# Patient Record
Sex: Female | Born: 1935 | Race: White | Hispanic: No | Smoking: Never smoker
Health system: Southern US, Community
[De-identification: ages and names within clinical notes are randomized; demographics above are authoritative.]

## PROBLEM LIST (undated history)

## (undated) DIAGNOSIS — M51369 Other intervertebral disc degeneration, lumbar region without mention of lumbar back pain or lower extremity pain: Secondary | ICD-10-CM

## (undated) DIAGNOSIS — N3281 Overactive bladder: Secondary | ICD-10-CM

## (undated) DIAGNOSIS — I2510 Atherosclerotic heart disease of native coronary artery without angina pectoris: Secondary | ICD-10-CM

## (undated) DIAGNOSIS — M1990 Unspecified osteoarthritis, unspecified site: Secondary | ICD-10-CM

## (undated) DIAGNOSIS — E785 Hyperlipidemia, unspecified: Secondary | ICD-10-CM

## (undated) DIAGNOSIS — A4902 Methicillin resistant Staphylococcus aureus infection, unspecified site: Secondary | ICD-10-CM

## (undated) DIAGNOSIS — I442 Atrioventricular block, complete: Secondary | ICD-10-CM

## (undated) DIAGNOSIS — M5136 Other intervertebral disc degeneration, lumbar region: Secondary | ICD-10-CM

## (undated) DIAGNOSIS — I10 Essential (primary) hypertension: Secondary | ICD-10-CM

## (undated) DIAGNOSIS — N200 Calculus of kidney: Secondary | ICD-10-CM

## (undated) DIAGNOSIS — I509 Heart failure, unspecified: Secondary | ICD-10-CM

## (undated) HISTORY — DX: Essential (primary) hypertension: I10

## (undated) HISTORY — DX: Overactive bladder: N32.81

## (undated) HISTORY — PX: ABDOMINAL HYSTERECTOMY: SHX81

## (undated) HISTORY — PX: CHOLECYSTECTOMY: SHX55

## (undated) HISTORY — PX: BACK SURGERY: SHX140

## (undated) HISTORY — DX: Other intervertebral disc degeneration, lumbar region without mention of lumbar back pain or lower extremity pain: M51.369

## (undated) HISTORY — DX: Calculus of kidney: N20.0

## (undated) HISTORY — DX: Hyperlipidemia, unspecified: E78.5

## (undated) HISTORY — DX: Atherosclerotic heart disease of native coronary artery without angina pectoris: I25.10

## (undated) HISTORY — DX: Other intervertebral disc degeneration, lumbar region: M51.36

## (undated) HISTORY — DX: Unspecified osteoarthritis, unspecified site: M19.90

## (undated) HISTORY — DX: Atrioventricular block, complete: I44.2

## (undated) HISTORY — DX: Methicillin resistant Staphylococcus aureus infection, unspecified site: A49.02

## (undated) HISTORY — PX: APPENDECTOMY: SHX54

## (undated) HISTORY — PX: OTHER SURGICAL HISTORY: SHX169

## (undated) HISTORY — DX: Heart failure, unspecified: I50.9

## (undated) HISTORY — PX: LUMBAR FUSION: SHX111

## (undated) HISTORY — PX: ANKLE SURGERY: SHX546

---

## 1997-04-16 ENCOUNTER — Ambulatory Visit (HOSPITAL_COMMUNITY): Admission: RE | Admit: 1997-04-16 | Discharge: 1997-04-16 | Payer: Self-pay | Admitting: Internal Medicine

## 1997-12-09 ENCOUNTER — Encounter: Payer: Self-pay | Admitting: Orthopedic Surgery

## 1997-12-09 ENCOUNTER — Inpatient Hospital Stay (HOSPITAL_COMMUNITY): Admission: EM | Admit: 1997-12-09 | Discharge: 1997-12-13 | Payer: Self-pay | Admitting: Emergency Medicine

## 1997-12-09 ENCOUNTER — Encounter: Payer: Self-pay | Admitting: Emergency Medicine

## 1998-04-08 ENCOUNTER — Ambulatory Visit (HOSPITAL_COMMUNITY): Admission: RE | Admit: 1998-04-08 | Discharge: 1998-04-08 | Payer: Self-pay | Admitting: Internal Medicine

## 1998-04-13 ENCOUNTER — Encounter: Admission: RE | Admit: 1998-04-13 | Discharge: 1998-07-12 | Payer: Self-pay | Admitting: Orthopedic Surgery

## 1999-05-21 ENCOUNTER — Encounter: Admission: RE | Admit: 1999-05-21 | Discharge: 1999-05-21 | Payer: Self-pay | Admitting: Gastroenterology

## 1999-05-21 ENCOUNTER — Encounter: Payer: Self-pay | Admitting: Gastroenterology

## 2000-08-11 ENCOUNTER — Ambulatory Visit (HOSPITAL_COMMUNITY): Admission: RE | Admit: 2000-08-11 | Discharge: 2000-08-11 | Payer: Self-pay | Admitting: Internal Medicine

## 2002-01-30 ENCOUNTER — Encounter: Payer: Self-pay | Admitting: Internal Medicine

## 2002-01-30 ENCOUNTER — Ambulatory Visit (HOSPITAL_COMMUNITY): Admission: RE | Admit: 2002-01-30 | Discharge: 2002-01-30 | Payer: Self-pay | Admitting: Internal Medicine

## 2002-11-04 ENCOUNTER — Encounter: Admission: RE | Admit: 2002-11-04 | Discharge: 2002-11-04 | Payer: Self-pay | Admitting: Gastroenterology

## 2004-02-11 ENCOUNTER — Emergency Department (HOSPITAL_COMMUNITY): Admission: EM | Admit: 2004-02-11 | Discharge: 2004-02-11 | Payer: Self-pay | Admitting: Emergency Medicine

## 2004-05-12 ENCOUNTER — Inpatient Hospital Stay (HOSPITAL_COMMUNITY): Admission: AD | Admit: 2004-05-12 | Discharge: 2004-05-14 | Payer: Self-pay | Admitting: *Deleted

## 2004-05-13 HISTORY — PX: PACEMAKER INSERTION: SHX728

## 2004-08-31 ENCOUNTER — Inpatient Hospital Stay (HOSPITAL_COMMUNITY): Admission: EM | Admit: 2004-08-31 | Discharge: 2004-09-03 | Payer: Self-pay | Admitting: Cardiovascular Disease

## 2004-09-01 HISTORY — PX: CARDIAC CATHETERIZATION: SHX172

## 2004-11-03 ENCOUNTER — Encounter: Admission: RE | Admit: 2004-11-03 | Discharge: 2004-11-03 | Payer: Self-pay | Admitting: Orthopedic Surgery

## 2005-01-03 DIAGNOSIS — A4902 Methicillin resistant Staphylococcus aureus infection, unspecified site: Secondary | ICD-10-CM

## 2005-01-03 HISTORY — DX: Methicillin resistant Staphylococcus aureus infection, unspecified site: A49.02

## 2005-04-03 HISTORY — PX: OTHER SURGICAL HISTORY: SHX169

## 2005-04-04 ENCOUNTER — Ambulatory Visit: Admission: RE | Admit: 2005-04-04 | Discharge: 2005-04-04 | Payer: Self-pay | Admitting: Orthopedic Surgery

## 2005-04-12 ENCOUNTER — Ambulatory Visit (HOSPITAL_COMMUNITY): Admission: RE | Admit: 2005-04-12 | Discharge: 2005-04-12 | Payer: Self-pay | Admitting: Cardiovascular Disease

## 2005-05-02 ENCOUNTER — Encounter (INDEPENDENT_AMBULATORY_CARE_PROVIDER_SITE_OTHER): Payer: Self-pay | Admitting: *Deleted

## 2005-05-02 ENCOUNTER — Inpatient Hospital Stay (HOSPITAL_COMMUNITY): Admission: RE | Admit: 2005-05-02 | Discharge: 2005-05-04 | Payer: Self-pay | Admitting: *Deleted

## 2005-05-04 ENCOUNTER — Encounter: Payer: Self-pay | Admitting: Internal Medicine

## 2005-06-03 HISTORY — PX: OTHER SURGICAL HISTORY: SHX169

## 2005-06-28 ENCOUNTER — Inpatient Hospital Stay (HOSPITAL_COMMUNITY): Admission: RE | Admit: 2005-06-28 | Discharge: 2005-07-04 | Payer: Self-pay | Admitting: Orthopedic Surgery

## 2005-11-03 ENCOUNTER — Ambulatory Visit: Payer: Self-pay | Admitting: Family Medicine

## 2005-11-04 DIAGNOSIS — H8110 Benign paroxysmal vertigo, unspecified ear: Secondary | ICD-10-CM

## 2005-11-15 ENCOUNTER — Encounter: Payer: Self-pay | Admitting: Family Medicine

## 2005-11-15 ENCOUNTER — Ambulatory Visit: Payer: Self-pay | Admitting: Family Medicine

## 2005-11-15 DIAGNOSIS — I2510 Atherosclerotic heart disease of native coronary artery without angina pectoris: Secondary | ICD-10-CM | POA: Insufficient documentation

## 2005-11-15 DIAGNOSIS — E119 Type 2 diabetes mellitus without complications: Secondary | ICD-10-CM

## 2005-11-17 ENCOUNTER — Encounter (INDEPENDENT_AMBULATORY_CARE_PROVIDER_SITE_OTHER): Payer: Self-pay | Admitting: *Deleted

## 2005-11-17 ENCOUNTER — Encounter: Payer: Self-pay | Admitting: Family Medicine

## 2005-11-17 LAB — CONVERTED CEMR LAB
Albumin: 4.3 g/dL (ref 3.5–5.2)
BUN: 23 mg/dL (ref 6–23)
Calcium: 9.9 mg/dL (ref 8.4–10.5)
Chloride: 104 meq/L (ref 96–112)
Glucose, Bld: 84 mg/dL (ref 70–99)
HDL: 44 mg/dL (ref >39)
Hemoglobin: 11.8 g/dL (ref 11.7–14.8)
Hgb A1c MFr Bld: 5.8 % (ref 4.6–6.1)
LDL Cholesterol: 99 mg/dL (ref 0–99)
Potassium: 4.8 meq/L (ref 3.5–5.3)
RBC: 3.75 M/uL — ABNORMAL LOW (ref 3.79–4.96)
Sodium: 142 meq/L (ref 135–145)
Total Protein: 6.9 g/dL (ref 6.0–8.3)
Triglycerides: 170 mg/dL — ABNORMAL HIGH (ref <150)
WBC: 4.4 10*3/uL (ref 3.7–10.0)

## 2005-11-21 ENCOUNTER — Telehealth (INDEPENDENT_AMBULATORY_CARE_PROVIDER_SITE_OTHER): Payer: Self-pay | Admitting: *Deleted

## 2005-12-13 ENCOUNTER — Ambulatory Visit: Payer: Self-pay | Admitting: Family Medicine

## 2005-12-13 DIAGNOSIS — I10 Essential (primary) hypertension: Secondary | ICD-10-CM

## 2005-12-14 ENCOUNTER — Encounter: Payer: Self-pay | Admitting: Family Medicine

## 2005-12-22 ENCOUNTER — Ambulatory Visit: Payer: Self-pay | Admitting: Family Medicine

## 2005-12-23 ENCOUNTER — Telehealth: Payer: Self-pay | Admitting: Family Medicine

## 2006-01-11 ENCOUNTER — Ambulatory Visit: Payer: Self-pay | Admitting: Family Medicine

## 2006-01-11 DIAGNOSIS — R5383 Other fatigue: Secondary | ICD-10-CM

## 2006-01-11 DIAGNOSIS — R5381 Other malaise: Secondary | ICD-10-CM

## 2006-01-11 DIAGNOSIS — R109 Unspecified abdominal pain: Secondary | ICD-10-CM

## 2006-02-08 ENCOUNTER — Ambulatory Visit: Payer: Self-pay | Admitting: Family Medicine

## 2006-02-08 DIAGNOSIS — F411 Generalized anxiety disorder: Secondary | ICD-10-CM | POA: Insufficient documentation

## 2006-02-15 ENCOUNTER — Encounter: Payer: Self-pay | Admitting: Family Medicine

## 2006-02-20 ENCOUNTER — Encounter: Payer: Self-pay | Admitting: Family Medicine

## 2006-02-22 ENCOUNTER — Encounter: Payer: Self-pay | Admitting: Family Medicine

## 2006-02-28 ENCOUNTER — Telehealth: Payer: Self-pay | Admitting: Family Medicine

## 2006-03-08 ENCOUNTER — Ambulatory Visit: Payer: Self-pay | Admitting: Family Medicine

## 2006-03-08 DIAGNOSIS — N318 Other neuromuscular dysfunction of bladder: Secondary | ICD-10-CM

## 2006-03-13 ENCOUNTER — Encounter: Payer: Self-pay | Admitting: Family Medicine

## 2006-04-14 ENCOUNTER — Telehealth: Payer: Self-pay | Admitting: Family Medicine

## 2006-04-20 ENCOUNTER — Encounter: Payer: Self-pay | Admitting: Family Medicine

## 2006-04-23 ENCOUNTER — Encounter: Payer: Self-pay | Admitting: Family Medicine

## 2006-04-28 ENCOUNTER — Encounter: Payer: Self-pay | Admitting: Family Medicine

## 2006-05-02 ENCOUNTER — Encounter: Payer: Self-pay | Admitting: Family Medicine

## 2006-05-04 ENCOUNTER — Encounter: Payer: Self-pay | Admitting: Family Medicine

## 2006-05-08 ENCOUNTER — Ambulatory Visit: Payer: Self-pay | Admitting: Family Medicine

## 2006-05-08 ENCOUNTER — Telehealth (INDEPENDENT_AMBULATORY_CARE_PROVIDER_SITE_OTHER): Payer: Self-pay | Admitting: *Deleted

## 2006-05-08 DIAGNOSIS — M533 Sacrococcygeal disorders, not elsewhere classified: Secondary | ICD-10-CM

## 2006-05-08 DIAGNOSIS — M25559 Pain in unspecified hip: Secondary | ICD-10-CM

## 2006-05-08 DIAGNOSIS — H6120 Impacted cerumen, unspecified ear: Secondary | ICD-10-CM

## 2006-05-09 ENCOUNTER — Encounter: Payer: Self-pay | Admitting: Family Medicine

## 2006-05-15 ENCOUNTER — Encounter: Payer: Self-pay | Admitting: Family Medicine

## 2006-05-18 ENCOUNTER — Encounter: Payer: Self-pay | Admitting: Family Medicine

## 2006-06-05 ENCOUNTER — Encounter: Payer: Self-pay | Admitting: Family Medicine

## 2006-06-06 ENCOUNTER — Encounter: Payer: Self-pay | Admitting: Family Medicine

## 2006-06-12 ENCOUNTER — Ambulatory Visit: Payer: Self-pay | Admitting: Family Medicine

## 2006-06-12 DIAGNOSIS — M545 Low back pain: Secondary | ICD-10-CM

## 2006-06-13 ENCOUNTER — Encounter: Payer: Self-pay | Admitting: Family Medicine

## 2006-06-13 ENCOUNTER — Telehealth: Payer: Self-pay | Admitting: Family Medicine

## 2006-06-13 DIAGNOSIS — N189 Chronic kidney disease, unspecified: Secondary | ICD-10-CM | POA: Insufficient documentation

## 2006-06-13 LAB — CONVERTED CEMR LAB
AST: 15 units/L (ref 0–37)
Albumin: 4.6 g/dL (ref 3.5–5.2)
Alkaline Phosphatase: 109 units/L (ref 39–117)
BUN: 26 mg/dL — ABNORMAL HIGH (ref 6–23)
Calcium: 9.9 mg/dL (ref 8.4–10.5)
Chloride: 101 meq/L (ref 96–112)
Glucose, Bld: 99 mg/dL (ref 70–99)
Potassium: 4.4 meq/L (ref 3.5–5.3)
Sodium: 140 meq/L (ref 135–145)
Total Protein: 7.5 g/dL (ref 6.0–8.3)

## 2006-07-21 ENCOUNTER — Encounter: Payer: Self-pay | Admitting: Family Medicine

## 2006-07-26 ENCOUNTER — Encounter: Payer: Self-pay | Admitting: Family Medicine

## 2006-07-27 ENCOUNTER — Telehealth: Payer: Self-pay | Admitting: Family Medicine

## 2006-08-14 ENCOUNTER — Ambulatory Visit: Payer: Self-pay | Admitting: Family Medicine

## 2006-08-14 DIAGNOSIS — R220 Localized swelling, mass and lump, head: Secondary | ICD-10-CM

## 2006-08-14 DIAGNOSIS — M5030 Other cervical disc degeneration, unspecified cervical region: Secondary | ICD-10-CM

## 2006-08-14 DIAGNOSIS — R221 Localized swelling, mass and lump, neck: Secondary | ICD-10-CM

## 2006-08-14 DIAGNOSIS — L820 Inflamed seborrheic keratosis: Secondary | ICD-10-CM

## 2006-08-15 ENCOUNTER — Encounter: Payer: Self-pay | Admitting: Family Medicine

## 2006-08-15 LAB — CONVERTED CEMR LAB
BUN: 19 mg/dL (ref 6–23)
CO2: 22 meq/L (ref 19–32)
Calcium: 9.4 mg/dL (ref 8.4–10.5)
Creatinine, Ser: 1.32 mg/dL — ABNORMAL HIGH (ref 0.40–1.20)
Glucose, Bld: 101 mg/dL — ABNORMAL HIGH (ref 70–99)
Sodium: 141 meq/L (ref 135–145)

## 2006-08-16 ENCOUNTER — Telehealth: Payer: Self-pay | Admitting: Family Medicine

## 2006-08-16 ENCOUNTER — Encounter: Payer: Self-pay | Admitting: Family Medicine

## 2006-08-16 ENCOUNTER — Telehealth (INDEPENDENT_AMBULATORY_CARE_PROVIDER_SITE_OTHER): Payer: Self-pay | Admitting: *Deleted

## 2006-08-17 ENCOUNTER — Encounter: Admission: RE | Admit: 2006-08-17 | Discharge: 2006-08-17 | Payer: Self-pay | Admitting: Family Medicine

## 2006-08-21 ENCOUNTER — Telehealth (INDEPENDENT_AMBULATORY_CARE_PROVIDER_SITE_OTHER): Payer: Self-pay | Admitting: *Deleted

## 2006-08-31 ENCOUNTER — Encounter: Payer: Self-pay | Admitting: Family Medicine

## 2006-09-11 ENCOUNTER — Encounter: Payer: Self-pay | Admitting: Family Medicine

## 2006-09-28 ENCOUNTER — Encounter: Payer: Self-pay | Admitting: Family Medicine

## 2006-10-01 ENCOUNTER — Inpatient Hospital Stay (HOSPITAL_COMMUNITY): Admission: EM | Admit: 2006-10-01 | Discharge: 2006-10-03 | Payer: Self-pay | Admitting: Emergency Medicine

## 2006-10-03 ENCOUNTER — Encounter: Payer: Self-pay | Admitting: Family Medicine

## 2006-10-09 ENCOUNTER — Telehealth: Payer: Self-pay | Admitting: Family Medicine

## 2006-10-10 ENCOUNTER — Encounter: Payer: Self-pay | Admitting: Family Medicine

## 2006-10-16 ENCOUNTER — Ambulatory Visit: Payer: Self-pay | Admitting: Family Medicine

## 2006-10-16 DIAGNOSIS — R1013 Epigastric pain: Secondary | ICD-10-CM

## 2006-10-16 DIAGNOSIS — K3189 Other diseases of stomach and duodenum: Secondary | ICD-10-CM

## 2006-10-16 LAB — CONVERTED CEMR LAB: Hgb A1c MFr Bld: 6.3 %

## 2006-10-23 ENCOUNTER — Telehealth: Payer: Self-pay | Admitting: Family Medicine

## 2006-11-07 ENCOUNTER — Ambulatory Visit: Payer: Self-pay | Admitting: Gastroenterology

## 2006-11-07 LAB — CONVERTED CEMR LAB
ALT: 17 units/L (ref 0–35)
Albumin: 3.7 g/dL (ref 3.5–5.2)
Alkaline Phosphatase: 113 units/L (ref 39–117)
BUN: 13 mg/dL (ref 6–23)
Basophils Absolute: 0 10*3/uL (ref 0.0–0.1)
Basophils Relative: 0.7 % (ref 0.0–1.0)
CO2: 31 meq/L (ref 19–32)
Calcium: 9.6 mg/dL (ref 8.4–10.5)
Chloride: 104 meq/L (ref 96–112)
Creatinine, Ser: 0.9 mg/dL (ref 0.4–1.2)
MCHC: 34.6 g/dL (ref 30.0–36.0)
Monocytes Absolute: 0.5 10*3/uL (ref 0.2–0.7)
Monocytes Relative: 9.3 % (ref 3.0–11.0)
Platelets: 234 10*3/uL (ref 150–400)
Potassium: 3.9 meq/L (ref 3.5–5.1)
RBC: 3.98 M/uL (ref 3.87–5.11)
RDW: 12.6 % (ref 11.5–14.6)
Total Bilirubin: 0.5 mg/dL (ref 0.3–1.2)

## 2006-11-09 ENCOUNTER — Ambulatory Visit: Payer: Self-pay | Admitting: Gastroenterology

## 2006-11-10 ENCOUNTER — Encounter: Payer: Self-pay | Admitting: Family Medicine

## 2006-11-13 ENCOUNTER — Encounter: Payer: Self-pay | Admitting: Family Medicine

## 2006-11-17 ENCOUNTER — Encounter: Payer: Self-pay | Admitting: Family Medicine

## 2006-11-20 ENCOUNTER — Ambulatory Visit: Payer: Self-pay | Admitting: Gastroenterology

## 2006-12-06 ENCOUNTER — Telehealth (INDEPENDENT_AMBULATORY_CARE_PROVIDER_SITE_OTHER): Payer: Self-pay | Admitting: *Deleted

## 2006-12-13 ENCOUNTER — Encounter: Payer: Self-pay | Admitting: Family Medicine

## 2006-12-18 ENCOUNTER — Ambulatory Visit: Payer: Self-pay | Admitting: Family Medicine

## 2006-12-18 ENCOUNTER — Encounter: Admission: RE | Admit: 2006-12-18 | Discharge: 2006-12-18 | Payer: Self-pay | Admitting: Family Medicine

## 2006-12-18 DIAGNOSIS — G4700 Insomnia, unspecified: Secondary | ICD-10-CM | POA: Insufficient documentation

## 2006-12-26 ENCOUNTER — Telehealth (INDEPENDENT_AMBULATORY_CARE_PROVIDER_SITE_OTHER): Payer: Self-pay | Admitting: *Deleted

## 2007-01-01 ENCOUNTER — Encounter: Payer: Self-pay | Admitting: Family Medicine

## 2007-01-02 ENCOUNTER — Encounter: Payer: Self-pay | Admitting: Family Medicine

## 2007-01-08 ENCOUNTER — Telehealth: Payer: Self-pay | Admitting: Family Medicine

## 2007-01-16 ENCOUNTER — Encounter: Payer: Self-pay | Admitting: Family Medicine

## 2007-01-17 ENCOUNTER — Encounter: Payer: Self-pay | Admitting: Family Medicine

## 2007-01-18 ENCOUNTER — Ambulatory Visit: Payer: Self-pay | Admitting: Family Medicine

## 2007-01-18 DIAGNOSIS — M6281 Muscle weakness (generalized): Secondary | ICD-10-CM | POA: Insufficient documentation

## 2007-01-18 LAB — CONVERTED CEMR LAB: Hgb A1c MFr Bld: 6 %

## 2007-01-19 ENCOUNTER — Encounter: Payer: Self-pay | Admitting: Family Medicine

## 2007-01-25 ENCOUNTER — Encounter: Payer: Self-pay | Admitting: Family Medicine

## 2007-01-26 ENCOUNTER — Telehealth: Payer: Self-pay | Admitting: Family Medicine

## 2007-01-29 ENCOUNTER — Encounter: Admission: RE | Admit: 2007-01-29 | Discharge: 2007-01-29 | Payer: Self-pay | Admitting: Family Medicine

## 2007-01-29 ENCOUNTER — Ambulatory Visit: Payer: Self-pay | Admitting: Family Medicine

## 2007-01-30 ENCOUNTER — Telehealth (INDEPENDENT_AMBULATORY_CARE_PROVIDER_SITE_OTHER): Payer: Self-pay | Admitting: *Deleted

## 2007-01-30 ENCOUNTER — Encounter: Payer: Self-pay | Admitting: Family Medicine

## 2007-01-31 ENCOUNTER — Encounter: Payer: Self-pay | Admitting: Family Medicine

## 2007-02-02 ENCOUNTER — Encounter: Payer: Self-pay | Admitting: Family Medicine

## 2007-02-04 ENCOUNTER — Encounter: Payer: Self-pay | Admitting: Family Medicine

## 2007-02-06 ENCOUNTER — Encounter: Payer: Self-pay | Admitting: Family Medicine

## 2007-02-07 ENCOUNTER — Telehealth: Payer: Self-pay | Admitting: Family Medicine

## 2007-02-07 ENCOUNTER — Encounter: Payer: Self-pay | Admitting: Family Medicine

## 2007-02-08 ENCOUNTER — Encounter: Payer: Self-pay | Admitting: Family Medicine

## 2007-02-08 ENCOUNTER — Telehealth: Payer: Self-pay | Admitting: Family Medicine

## 2007-02-09 ENCOUNTER — Ambulatory Visit: Payer: Self-pay | Admitting: Family Medicine

## 2007-02-09 DIAGNOSIS — J189 Pneumonia, unspecified organism: Secondary | ICD-10-CM

## 2007-02-12 ENCOUNTER — Encounter: Payer: Self-pay | Admitting: Family Medicine

## 2007-02-13 ENCOUNTER — Telehealth: Payer: Self-pay | Admitting: Family Medicine

## 2007-02-13 ENCOUNTER — Encounter: Payer: Self-pay | Admitting: Family Medicine

## 2007-02-20 ENCOUNTER — Encounter: Admission: RE | Admit: 2007-02-20 | Discharge: 2007-02-20 | Payer: Self-pay | Admitting: Family Medicine

## 2007-02-20 ENCOUNTER — Ambulatory Visit: Payer: Self-pay | Admitting: Family Medicine

## 2007-02-26 DIAGNOSIS — I5032 Chronic diastolic (congestive) heart failure: Secondary | ICD-10-CM

## 2007-02-26 DIAGNOSIS — F329 Major depressive disorder, single episode, unspecified: Secondary | ICD-10-CM

## 2007-02-26 DIAGNOSIS — K589 Irritable bowel syndrome without diarrhea: Secondary | ICD-10-CM | POA: Insufficient documentation

## 2007-02-26 DIAGNOSIS — K449 Diaphragmatic hernia without obstruction or gangrene: Secondary | ICD-10-CM | POA: Insufficient documentation

## 2007-02-26 DIAGNOSIS — M129 Arthropathy, unspecified: Secondary | ICD-10-CM | POA: Insufficient documentation

## 2007-02-28 ENCOUNTER — Telehealth: Payer: Self-pay | Admitting: Family Medicine

## 2007-03-09 ENCOUNTER — Encounter: Payer: Self-pay | Admitting: Family Medicine

## 2007-03-13 ENCOUNTER — Encounter: Payer: Self-pay | Admitting: Family Medicine

## 2007-03-30 ENCOUNTER — Telehealth: Payer: Self-pay | Admitting: Family Medicine

## 2007-03-31 ENCOUNTER — Emergency Department (HOSPITAL_COMMUNITY): Admission: EM | Admit: 2007-03-31 | Discharge: 2007-03-31 | Payer: Self-pay | Admitting: Emergency Medicine

## 2007-04-03 ENCOUNTER — Telehealth: Payer: Self-pay | Admitting: Family Medicine

## 2007-04-04 ENCOUNTER — Encounter: Admission: RE | Admit: 2007-04-04 | Discharge: 2007-04-04 | Payer: Self-pay | Admitting: Sports Medicine

## 2007-04-09 ENCOUNTER — Encounter: Payer: Self-pay | Admitting: Family Medicine

## 2007-04-11 ENCOUNTER — Encounter: Payer: Self-pay | Admitting: Family Medicine

## 2007-04-13 ENCOUNTER — Encounter: Payer: Self-pay | Admitting: Family Medicine

## 2007-04-16 ENCOUNTER — Telehealth: Payer: Self-pay | Admitting: Family Medicine

## 2007-04-17 ENCOUNTER — Encounter: Admission: RE | Admit: 2007-04-17 | Discharge: 2007-04-17 | Payer: Self-pay | Admitting: Sports Medicine

## 2007-04-17 ENCOUNTER — Encounter: Payer: Self-pay | Admitting: Family Medicine

## 2007-04-26 ENCOUNTER — Encounter: Payer: Self-pay | Admitting: Family Medicine

## 2007-04-30 ENCOUNTER — Encounter: Payer: Self-pay | Admitting: Family Medicine

## 2007-05-01 ENCOUNTER — Encounter: Admission: RE | Admit: 2007-05-01 | Discharge: 2007-05-01 | Payer: Self-pay | Admitting: Sports Medicine

## 2007-05-02 ENCOUNTER — Encounter: Payer: Self-pay | Admitting: Family Medicine

## 2007-05-04 ENCOUNTER — Telehealth: Payer: Self-pay | Admitting: Family Medicine

## 2007-05-07 ENCOUNTER — Ambulatory Visit: Payer: Self-pay | Admitting: Family Medicine

## 2007-05-07 DIAGNOSIS — R350 Frequency of micturition: Secondary | ICD-10-CM

## 2007-05-07 DIAGNOSIS — B372 Candidiasis of skin and nail: Secondary | ICD-10-CM | POA: Insufficient documentation

## 2007-05-07 DIAGNOSIS — L501 Idiopathic urticaria: Secondary | ICD-10-CM

## 2007-05-07 LAB — CONVERTED CEMR LAB
Bilirubin Urine: NEGATIVE
Blood in Urine, dipstick: NEGATIVE
Glucose, Urine, Semiquant: NEGATIVE
Ketones, urine, test strip: NEGATIVE
Nitrite: NEGATIVE
WBC Urine, dipstick: NEGATIVE

## 2007-05-08 LAB — CONVERTED CEMR LAB
HCT: 40.4 % (ref 36.0–46.0)
Hemoglobin: 12.9 g/dL (ref 12.0–15.0)
Lymphocytes Relative: 26 % (ref 12–46)
MCHC: 31.9 g/dL (ref 30.0–36.0)
Monocytes Absolute: 0.6 10*3/uL (ref 0.1–1.0)
Monocytes Relative: 8 % (ref 3–12)
Neutro Abs: 5.3 10*3/uL (ref 1.7–7.7)
RBC: 4.21 M/uL (ref 3.87–5.11)

## 2007-05-10 ENCOUNTER — Telehealth: Payer: Self-pay | Admitting: Family Medicine

## 2007-05-17 ENCOUNTER — Encounter: Admission: RE | Admit: 2007-05-17 | Discharge: 2007-05-17 | Payer: Self-pay | Admitting: Sports Medicine

## 2007-05-18 ENCOUNTER — Encounter: Payer: Self-pay | Admitting: Family Medicine

## 2007-05-23 ENCOUNTER — Telehealth: Payer: Self-pay | Admitting: Family Medicine

## 2007-05-23 ENCOUNTER — Encounter: Payer: Self-pay | Admitting: Family Medicine

## 2007-06-01 ENCOUNTER — Encounter: Payer: Self-pay | Admitting: Family Medicine

## 2007-06-08 ENCOUNTER — Emergency Department (HOSPITAL_COMMUNITY): Admission: EM | Admit: 2007-06-08 | Discharge: 2007-06-08 | Payer: Self-pay | Admitting: Emergency Medicine

## 2007-06-18 ENCOUNTER — Ambulatory Visit: Payer: Self-pay | Admitting: Family Medicine

## 2007-06-21 ENCOUNTER — Encounter: Payer: Self-pay | Admitting: Family Medicine

## 2007-07-05 ENCOUNTER — Telehealth: Payer: Self-pay | Admitting: Family Medicine

## 2007-07-05 ENCOUNTER — Encounter: Payer: Self-pay | Admitting: Family Medicine

## 2007-07-06 ENCOUNTER — Encounter: Payer: Self-pay | Admitting: Family Medicine

## 2007-07-10 ENCOUNTER — Telehealth: Payer: Self-pay | Admitting: Family Medicine

## 2007-07-11 ENCOUNTER — Encounter: Payer: Self-pay | Admitting: Family Medicine

## 2007-07-12 ENCOUNTER — Encounter: Payer: Self-pay | Admitting: Family Medicine

## 2007-07-23 ENCOUNTER — Ambulatory Visit: Payer: Self-pay | Admitting: Family Medicine

## 2007-07-23 ENCOUNTER — Encounter: Admission: RE | Admit: 2007-07-23 | Discharge: 2007-07-23 | Payer: Self-pay | Admitting: Family Medicine

## 2007-07-23 DIAGNOSIS — R609 Edema, unspecified: Secondary | ICD-10-CM

## 2007-07-24 LAB — CONVERTED CEMR LAB
Albumin: 4.1 g/dL (ref 3.5–5.2)
BUN: 22 mg/dL (ref 6–23)
Calcium: 9.7 mg/dL (ref 8.4–10.5)
Chloride: 106 meq/L (ref 96–112)
Ferritin: 39 ng/mL (ref 10–291)
Glucose, Bld: 96 mg/dL (ref 70–99)
Iron: 76 ug/dL (ref 42–145)
Potassium: 4.9 meq/L (ref 3.5–5.3)
TSH: 1.593 microintl units/mL (ref 0.350–4.50)

## 2007-07-27 ENCOUNTER — Telehealth: Payer: Self-pay | Admitting: Family Medicine

## 2007-07-30 ENCOUNTER — Encounter: Payer: Self-pay | Admitting: Family Medicine

## 2007-09-06 ENCOUNTER — Encounter: Payer: Self-pay | Admitting: Family Medicine

## 2007-09-20 ENCOUNTER — Telehealth: Payer: Self-pay | Admitting: Family Medicine

## 2007-09-20 ENCOUNTER — Encounter: Payer: Self-pay | Admitting: Family Medicine

## 2007-09-24 ENCOUNTER — Encounter: Payer: Self-pay | Admitting: Family Medicine

## 2007-09-25 ENCOUNTER — Encounter: Payer: Self-pay | Admitting: Family Medicine

## 2007-09-26 ENCOUNTER — Encounter: Payer: Self-pay | Admitting: Family Medicine

## 2007-09-28 ENCOUNTER — Encounter: Payer: Self-pay | Admitting: Family Medicine

## 2007-10-05 ENCOUNTER — Encounter: Payer: Self-pay | Admitting: Family Medicine

## 2007-10-08 ENCOUNTER — Encounter: Payer: Self-pay | Admitting: Family Medicine

## 2007-10-12 ENCOUNTER — Ambulatory Visit: Payer: Self-pay | Admitting: Family Medicine

## 2007-10-26 IMAGING — CR DG CHEST 2V
2 series · 2 of 2 positions shown · non-contrast
Comparison: 05/14/2004.

CLINICAL DATA: Peripheral vascular disease. Pre-arteriogram evaluation.

CHEST - 2 VIEW

[view not recorded (1 of 2)]
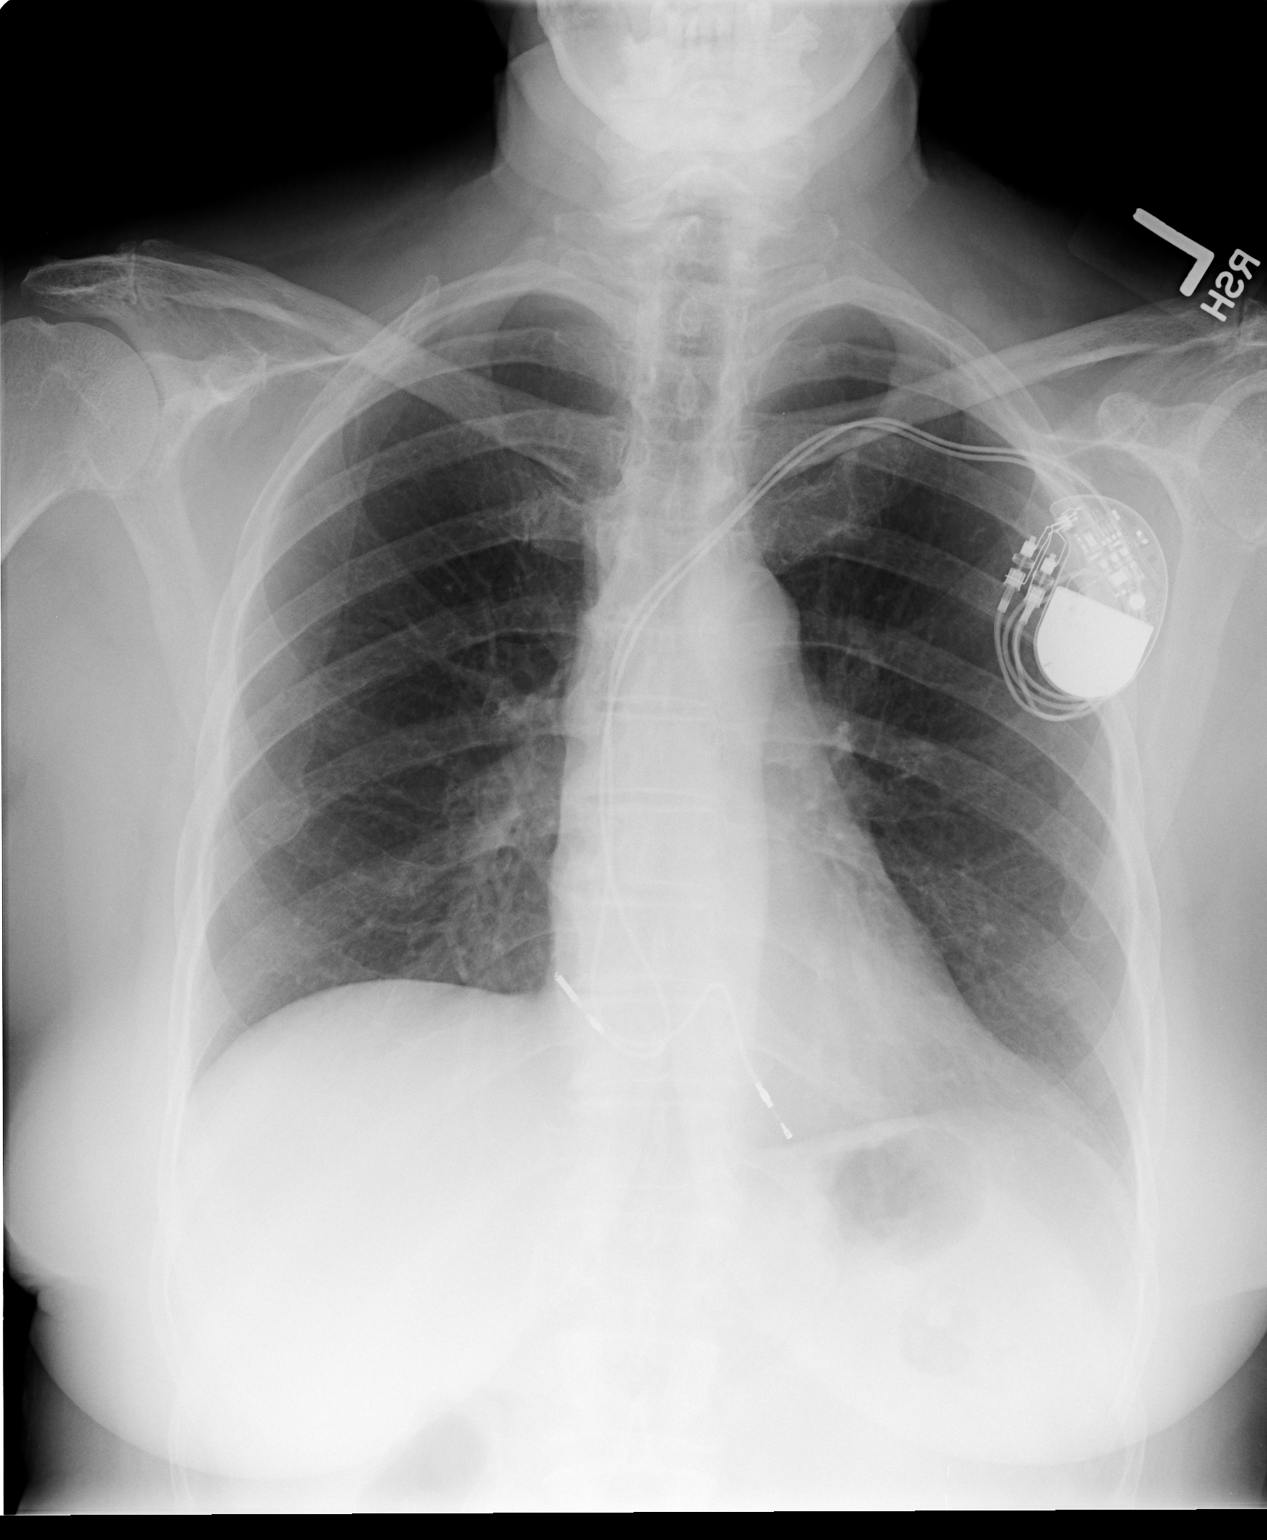

[view not recorded (2 of 2)]
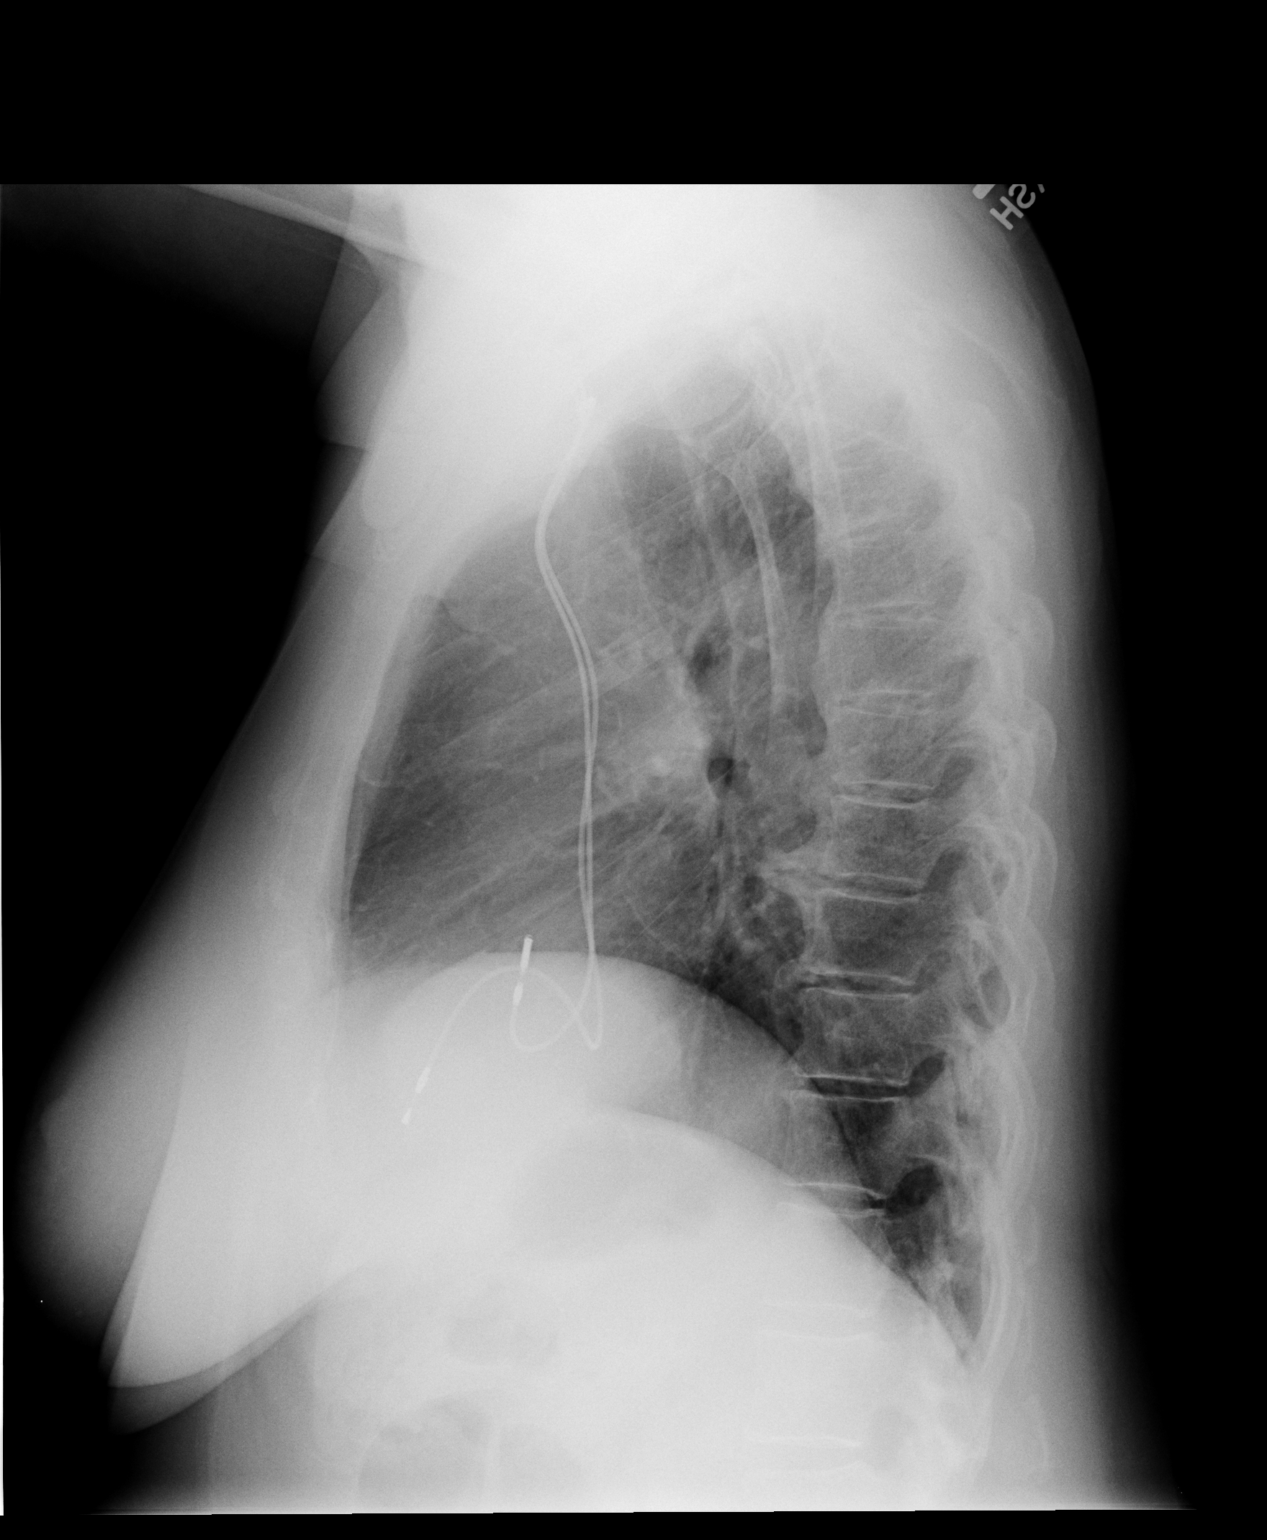

[2 of 2 positions shown; findings below may reference images not displayed]

FINDINGS: Normal sized heart. Stable left subclavian pacemaker leads. Clear
lungs with normal vascularity. Thoracic spine degenerative changes.

IMPRESSION

No acute abnormality.

## 2007-11-19 ENCOUNTER — Encounter: Payer: Self-pay | Admitting: Family Medicine

## 2007-11-30 ENCOUNTER — Encounter: Payer: Self-pay | Admitting: Family Medicine

## 2007-12-03 ENCOUNTER — Encounter: Payer: Self-pay | Admitting: Family Medicine

## 2007-12-06 ENCOUNTER — Encounter: Payer: Self-pay | Admitting: Family Medicine

## 2007-12-20 ENCOUNTER — Encounter: Payer: Self-pay | Admitting: Family Medicine

## 2008-01-01 ENCOUNTER — Telehealth (INDEPENDENT_AMBULATORY_CARE_PROVIDER_SITE_OTHER): Payer: Self-pay | Admitting: *Deleted

## 2008-01-01 ENCOUNTER — Encounter: Payer: Self-pay | Admitting: Family Medicine

## 2008-01-08 ENCOUNTER — Encounter: Payer: Self-pay | Admitting: Family Medicine

## 2008-01-11 ENCOUNTER — Encounter: Payer: Self-pay | Admitting: Family Medicine

## 2008-01-15 ENCOUNTER — Encounter: Admission: RE | Admit: 2008-01-15 | Discharge: 2008-01-15 | Payer: Self-pay | Admitting: Family Medicine

## 2008-01-15 ENCOUNTER — Ambulatory Visit: Payer: Self-pay | Admitting: Family Medicine

## 2008-01-16 ENCOUNTER — Encounter: Payer: Self-pay | Admitting: Family Medicine

## 2008-01-17 ENCOUNTER — Telehealth (INDEPENDENT_AMBULATORY_CARE_PROVIDER_SITE_OTHER): Payer: Self-pay | Admitting: *Deleted

## 2008-01-18 ENCOUNTER — Encounter: Payer: Self-pay | Admitting: Family Medicine

## 2008-01-21 ENCOUNTER — Encounter: Payer: Self-pay | Admitting: Family Medicine

## 2008-01-21 LAB — CONVERTED CEMR LAB
ALT: 12 units/L (ref 0–35)
AST: 17 units/L (ref 0–37)
Alkaline Phosphatase: 89 units/L (ref 39–117)
BUN: 11 mg/dL (ref 6–23)
Creatinine, Ser: 0.98 mg/dL (ref 0.40–1.20)
HCT: 41.3 % (ref 36.0–46.0)
HDL: 54 mg/dL (ref >39)
MCHC: 32.9 g/dL (ref 30.0–36.0)
MCV: 94.5 fL (ref 78.0–100.0)
Platelets: 214 10*3/uL (ref 150–400)
RDW: 13.5 % (ref 11.5–15.5)
Total Bilirubin: 0.5 mg/dL (ref 0.3–1.2)
Total CHOL/HDL Ratio: 2.9
VLDL: 27 mg/dL (ref 0–40)

## 2008-01-23 LAB — CONVERTED CEMR LAB: Hgb A1c MFr Bld: 6 % (ref 4.6–6.1)

## 2008-01-29 ENCOUNTER — Telehealth: Payer: Self-pay | Admitting: Family Medicine

## 2008-01-31 ENCOUNTER — Encounter: Payer: Self-pay | Admitting: Family Medicine

## 2008-02-05 ENCOUNTER — Encounter: Payer: Self-pay | Admitting: Family Medicine

## 2008-02-07 ENCOUNTER — Encounter: Payer: Self-pay | Admitting: Family Medicine

## 2008-02-19 ENCOUNTER — Encounter: Payer: Self-pay | Admitting: Family Medicine

## 2008-02-25 ENCOUNTER — Encounter: Payer: Self-pay | Admitting: Family Medicine

## 2008-02-28 ENCOUNTER — Encounter: Payer: Self-pay | Admitting: Family Medicine

## 2008-03-04 ENCOUNTER — Encounter: Payer: Self-pay | Admitting: Family Medicine

## 2008-03-14 ENCOUNTER — Encounter: Payer: Self-pay | Admitting: Family Medicine

## 2008-03-27 ENCOUNTER — Encounter: Payer: Self-pay | Admitting: Family Medicine

## 2008-04-07 ENCOUNTER — Encounter: Payer: Self-pay | Admitting: Family Medicine

## 2008-04-14 ENCOUNTER — Telehealth: Payer: Self-pay | Admitting: Family Medicine

## 2008-04-15 ENCOUNTER — Encounter: Payer: Self-pay | Admitting: Family Medicine

## 2008-04-23 ENCOUNTER — Ambulatory Visit: Payer: Self-pay | Admitting: Family Medicine

## 2008-04-29 ENCOUNTER — Telehealth: Payer: Self-pay | Admitting: Family Medicine

## 2008-04-30 ENCOUNTER — Encounter: Payer: Self-pay | Admitting: Family Medicine

## 2008-05-01 ENCOUNTER — Encounter: Payer: Self-pay | Admitting: Family Medicine

## 2008-05-06 ENCOUNTER — Telehealth: Payer: Self-pay | Admitting: Family Medicine

## 2008-05-13 ENCOUNTER — Telehealth: Payer: Self-pay | Admitting: Family Medicine

## 2008-05-14 ENCOUNTER — Encounter: Payer: Self-pay | Admitting: Family Medicine

## 2008-05-14 ENCOUNTER — Emergency Department (HOSPITAL_BASED_OUTPATIENT_CLINIC_OR_DEPARTMENT_OTHER): Admission: EM | Admit: 2008-05-14 | Discharge: 2008-05-14 | Payer: Self-pay | Admitting: Emergency Medicine

## 2008-05-16 ENCOUNTER — Telehealth: Payer: Self-pay | Admitting: Family Medicine

## 2008-05-16 ENCOUNTER — Encounter: Payer: Self-pay | Admitting: Family Medicine

## 2008-05-21 ENCOUNTER — Telehealth: Payer: Self-pay | Admitting: Family Medicine

## 2008-05-23 ENCOUNTER — Encounter: Payer: Self-pay | Admitting: Family Medicine

## 2008-05-26 ENCOUNTER — Encounter: Payer: Self-pay | Admitting: Family Medicine

## 2008-05-30 ENCOUNTER — Telehealth: Payer: Self-pay | Admitting: Family Medicine

## 2008-06-04 ENCOUNTER — Telehealth: Payer: Self-pay | Admitting: Family Medicine

## 2008-06-04 ENCOUNTER — Ambulatory Visit: Payer: Self-pay | Admitting: Family Medicine

## 2008-06-04 LAB — CONVERTED CEMR LAB
Blood in Urine, dipstick: NEGATIVE
Nitrite: NEGATIVE
pH: 6

## 2008-06-05 LAB — CONVERTED CEMR LAB
AST: 20 units/L (ref 0–37)
Alkaline Phosphatase: 96 units/L (ref 39–117)
BUN: 27 mg/dL — ABNORMAL HIGH (ref 6–23)
Basophils Absolute: 0 10*3/uL (ref 0.0–0.1)
Basophils Relative: 1 % (ref 0–1)
Creatinine, Ser: 1.25 mg/dL — ABNORMAL HIGH (ref 0.40–1.20)
Eosinophils Relative: 1 % (ref 0–5)
Glucose, Bld: 155 mg/dL — ABNORMAL HIGH (ref 70–99)
HCT: 41 % (ref 36.0–46.0)
Hemoglobin: 13.4 g/dL (ref 12.0–15.0)
Lymphocytes Relative: 33 % (ref 12–46)
MCHC: 32.7 g/dL (ref 30.0–36.0)
Monocytes Absolute: 0.7 10*3/uL (ref 0.1–1.0)
Platelets: 347 10*3/uL (ref 150–400)
Potassium: 5.2 meq/L (ref 3.5–5.3)
Pro B Natriuretic peptide (BNP): 12.4 pg/mL (ref 0.0–100.0)
RDW: 13.8 % (ref 11.5–15.5)
Total Bilirubin: 0.5 mg/dL (ref 0.3–1.2)

## 2008-06-09 ENCOUNTER — Telehealth: Payer: Self-pay | Admitting: Family Medicine

## 2008-06-09 ENCOUNTER — Encounter: Payer: Self-pay | Admitting: Family Medicine

## 2008-06-10 ENCOUNTER — Telehealth: Payer: Self-pay | Admitting: Family Medicine

## 2008-06-11 ENCOUNTER — Encounter: Payer: Self-pay | Admitting: Family Medicine

## 2008-06-11 ENCOUNTER — Telehealth: Payer: Self-pay | Admitting: Family Medicine

## 2008-06-24 ENCOUNTER — Encounter: Payer: Self-pay | Admitting: Family Medicine

## 2008-06-30 ENCOUNTER — Telehealth: Payer: Self-pay | Admitting: Family Medicine

## 2008-06-30 ENCOUNTER — Encounter: Payer: Self-pay | Admitting: Family Medicine

## 2008-07-04 ENCOUNTER — Encounter: Payer: Self-pay | Admitting: Family Medicine

## 2008-07-10 ENCOUNTER — Telehealth: Payer: Self-pay | Admitting: Family Medicine

## 2008-07-14 ENCOUNTER — Telehealth: Payer: Self-pay | Admitting: Family Medicine

## 2008-07-23 ENCOUNTER — Ambulatory Visit: Payer: Self-pay | Admitting: Family Medicine

## 2008-07-23 LAB — CONVERTED CEMR LAB
Bilirubin Urine: NEGATIVE
Blood in Urine, dipstick: NEGATIVE
Glucose, Urine, Semiquant: NEGATIVE
Ketones, urine, test strip: NEGATIVE
Protein, U semiquant: NEGATIVE
Specific Gravity, Urine: 1.015
WBC Urine, dipstick: NEGATIVE
pH: 6.5

## 2008-08-01 ENCOUNTER — Encounter: Payer: Self-pay | Admitting: Family Medicine

## 2008-08-01 ENCOUNTER — Encounter: Admission: RE | Admit: 2008-08-01 | Discharge: 2008-08-01 | Payer: Self-pay | Admitting: Cardiology

## 2008-08-04 ENCOUNTER — Encounter: Payer: Self-pay | Admitting: Family Medicine

## 2008-08-06 HISTORY — PX: US ECHOCARDIOGRAPHY: HXRAD669

## 2008-08-07 ENCOUNTER — Encounter: Payer: Self-pay | Admitting: Family Medicine

## 2008-08-13 ENCOUNTER — Telehealth (INDEPENDENT_AMBULATORY_CARE_PROVIDER_SITE_OTHER): Payer: Self-pay | Admitting: *Deleted

## 2008-08-18 ENCOUNTER — Encounter: Payer: Self-pay | Admitting: Family Medicine

## 2008-08-18 HISTORY — PX: NM MYOCAR PERF WALL MOTION: HXRAD629

## 2008-09-11 ENCOUNTER — Ambulatory Visit: Payer: Self-pay | Admitting: Family Medicine

## 2008-09-11 DIAGNOSIS — J069 Acute upper respiratory infection, unspecified: Secondary | ICD-10-CM | POA: Insufficient documentation

## 2008-09-11 LAB — CONVERTED CEMR LAB: Hgb A1c MFr Bld: 7.3 %

## 2008-09-12 LAB — CONVERTED CEMR LAB
Basophils Relative: 1 % (ref 0–1)
CO2: 22 meq/L (ref 19–32)
Calcium: 9.2 mg/dL (ref 8.4–10.5)
Hemoglobin: 13 g/dL (ref 12.0–15.0)
Lymphocytes Relative: 33 % (ref 12–46)
MCHC: 32.7 g/dL (ref 30.0–36.0)
Monocytes Absolute: 0.5 10*3/uL (ref 0.1–1.0)
Monocytes Relative: 9 % (ref 3–12)
Neutro Abs: 3.2 10*3/uL (ref 1.7–7.7)
Neutrophils Relative %: 55 % (ref 43–77)
Potassium: 4.6 meq/L (ref 3.5–5.3)
RBC: 4.3 M/uL (ref 3.87–5.11)
Sodium: 138 meq/L (ref 135–145)
WBC: 5.8 10*3/uL (ref 4.0–10.5)

## 2008-09-25 ENCOUNTER — Encounter: Payer: Self-pay | Admitting: Family Medicine

## 2008-09-26 ENCOUNTER — Encounter: Payer: Self-pay | Admitting: Family Medicine

## 2008-10-22 ENCOUNTER — Ambulatory Visit: Payer: Self-pay | Admitting: Family Medicine

## 2008-11-06 ENCOUNTER — Encounter: Payer: Self-pay | Admitting: Family Medicine

## 2008-11-18 ENCOUNTER — Telehealth: Payer: Self-pay | Admitting: Family Medicine

## 2008-11-19 ENCOUNTER — Encounter: Payer: Self-pay | Admitting: Family Medicine

## 2008-12-05 ENCOUNTER — Telehealth: Payer: Self-pay | Admitting: Family Medicine

## 2008-12-09 ENCOUNTER — Ambulatory Visit: Payer: Self-pay | Admitting: Family Medicine

## 2008-12-09 ENCOUNTER — Encounter: Admission: RE | Admit: 2008-12-09 | Discharge: 2008-12-09 | Payer: Self-pay | Admitting: Family Medicine

## 2008-12-09 DIAGNOSIS — R112 Nausea with vomiting, unspecified: Secondary | ICD-10-CM

## 2008-12-09 DIAGNOSIS — R1013 Epigastric pain: Secondary | ICD-10-CM

## 2008-12-10 ENCOUNTER — Ambulatory Visit: Payer: Self-pay | Admitting: Diagnostic Radiology

## 2008-12-10 ENCOUNTER — Emergency Department (HOSPITAL_BASED_OUTPATIENT_CLINIC_OR_DEPARTMENT_OTHER): Admission: EM | Admit: 2008-12-10 | Discharge: 2008-12-10 | Payer: Self-pay | Admitting: Emergency Medicine

## 2008-12-10 LAB — CONVERTED CEMR LAB
Albumin: 4.6 g/dL (ref 3.5–5.2)
Amylase: 18 units/L (ref 0–105)
BUN: 15 mg/dL (ref 6–23)
CO2: 29 meq/L (ref 19–32)
Calcium: 10.5 mg/dL (ref 8.4–10.5)
Chloride: 98 meq/L (ref 96–112)
Eosinophils Absolute: 0.2 10*3/uL (ref 0.0–0.7)
Eosinophils Relative: 3 % (ref 0–5)
Glucose, Bld: 175 mg/dL — ABNORMAL HIGH (ref 70–99)
HCT: 42.3 % (ref 36.0–46.0)
Hemoglobin: 13.8 g/dL (ref 12.0–15.0)
Lipase: 15 units/L (ref 0–75)
Lymphocytes Relative: 29 % (ref 12–46)
Lymphs Abs: 1.7 10*3/uL (ref 0.7–4.0)
MCV: 93.8 fL (ref 78.0–100.0)
Monocytes Absolute: 0.6 10*3/uL (ref 0.1–1.0)
Potassium: 4.6 meq/L (ref 3.5–5.3)
RDW: 13.5 % (ref 11.5–15.5)
WBC: 5.7 10*3/uL (ref 4.0–10.5)

## 2008-12-16 ENCOUNTER — Encounter: Admission: RE | Admit: 2008-12-16 | Discharge: 2008-12-16 | Payer: Self-pay | Admitting: Family Medicine

## 2008-12-16 ENCOUNTER — Encounter: Payer: Self-pay | Admitting: Family Medicine

## 2008-12-17 ENCOUNTER — Encounter: Payer: Self-pay | Admitting: Family Medicine

## 2008-12-17 ENCOUNTER — Telehealth: Payer: Self-pay | Admitting: Family Medicine

## 2008-12-17 DIAGNOSIS — K5909 Other constipation: Secondary | ICD-10-CM

## 2008-12-17 DIAGNOSIS — K219 Gastro-esophageal reflux disease without esophagitis: Secondary | ICD-10-CM

## 2008-12-19 ENCOUNTER — Encounter (INDEPENDENT_AMBULATORY_CARE_PROVIDER_SITE_OTHER): Payer: Self-pay | Admitting: *Deleted

## 2008-12-30 ENCOUNTER — Encounter: Payer: Self-pay | Admitting: Family Medicine

## 2008-12-31 ENCOUNTER — Encounter: Payer: Self-pay | Admitting: Family Medicine

## 2009-01-07 ENCOUNTER — Ambulatory Visit: Payer: Self-pay | Admitting: Family Medicine

## 2009-01-07 LAB — CONVERTED CEMR LAB: Hgb A1c MFr Bld: 7.5 %

## 2009-01-13 ENCOUNTER — Encounter: Payer: Self-pay | Admitting: Family Medicine

## 2009-01-20 ENCOUNTER — Telehealth: Payer: Self-pay | Admitting: Family Medicine

## 2009-01-28 ENCOUNTER — Encounter: Payer: Self-pay | Admitting: Family Medicine

## 2009-02-04 ENCOUNTER — Encounter: Payer: Self-pay | Admitting: Family Medicine

## 2009-02-05 ENCOUNTER — Encounter: Payer: Self-pay | Admitting: Family Medicine

## 2009-02-10 ENCOUNTER — Telehealth: Payer: Self-pay | Admitting: Family Medicine

## 2009-02-10 DIAGNOSIS — R300 Dysuria: Secondary | ICD-10-CM | POA: Insufficient documentation

## 2009-02-12 ENCOUNTER — Emergency Department (HOSPITAL_BASED_OUTPATIENT_CLINIC_OR_DEPARTMENT_OTHER): Admission: EM | Admit: 2009-02-12 | Discharge: 2009-02-12 | Payer: Self-pay | Admitting: Emergency Medicine

## 2009-02-12 ENCOUNTER — Encounter: Payer: Self-pay | Admitting: Family Medicine

## 2009-02-12 ENCOUNTER — Ambulatory Visit: Payer: Self-pay | Admitting: Diagnostic Radiology

## 2009-02-12 ENCOUNTER — Telehealth (INDEPENDENT_AMBULATORY_CARE_PROVIDER_SITE_OTHER): Payer: Self-pay | Admitting: *Deleted

## 2009-02-19 ENCOUNTER — Telehealth: Payer: Self-pay | Admitting: Family Medicine

## 2009-02-24 ENCOUNTER — Encounter: Payer: Self-pay | Admitting: Family Medicine

## 2009-02-26 ENCOUNTER — Ambulatory Visit: Payer: Self-pay | Admitting: Family Medicine

## 2009-03-26 ENCOUNTER — Encounter: Payer: Self-pay | Admitting: Family Medicine

## 2009-03-31 ENCOUNTER — Telehealth: Payer: Self-pay | Admitting: Family Medicine

## 2009-04-01 ENCOUNTER — Encounter: Payer: Self-pay | Admitting: Family Medicine

## 2009-04-02 ENCOUNTER — Telehealth: Payer: Self-pay | Admitting: Family Medicine

## 2009-04-07 ENCOUNTER — Ambulatory Visit: Payer: Self-pay | Admitting: Family Medicine

## 2009-04-07 LAB — HM DIABETES FOOT EXAM

## 2009-04-08 ENCOUNTER — Encounter: Payer: Self-pay | Admitting: Family Medicine

## 2009-04-13 ENCOUNTER — Encounter: Payer: Self-pay | Admitting: Family Medicine

## 2009-04-13 ENCOUNTER — Telehealth: Payer: Self-pay | Admitting: Family Medicine

## 2009-04-16 ENCOUNTER — Encounter: Payer: Self-pay | Admitting: Family Medicine

## 2009-04-18 ENCOUNTER — Emergency Department (HOSPITAL_BASED_OUTPATIENT_CLINIC_OR_DEPARTMENT_OTHER): Admission: EM | Admit: 2009-04-18 | Discharge: 2009-04-18 | Payer: Self-pay | Admitting: Emergency Medicine

## 2009-04-18 ENCOUNTER — Ambulatory Visit: Payer: Self-pay | Admitting: Diagnostic Radiology

## 2009-04-20 ENCOUNTER — Ambulatory Visit: Payer: Self-pay | Admitting: Family Medicine

## 2009-04-20 ENCOUNTER — Encounter: Admission: RE | Admit: 2009-04-20 | Discharge: 2009-04-20 | Payer: Self-pay | Admitting: Family Medicine

## 2009-04-20 DIAGNOSIS — R05 Cough: Secondary | ICD-10-CM

## 2009-04-21 ENCOUNTER — Telehealth: Payer: Self-pay | Admitting: Family Medicine

## 2009-04-22 ENCOUNTER — Telehealth: Payer: Self-pay | Admitting: Family Medicine

## 2009-04-29 ENCOUNTER — Encounter: Payer: Self-pay | Admitting: Family Medicine

## 2009-04-29 ENCOUNTER — Ambulatory Visit: Payer: Self-pay | Admitting: Family Medicine

## 2009-04-29 ENCOUNTER — Inpatient Hospital Stay (HOSPITAL_COMMUNITY): Admission: AD | Admit: 2009-04-29 | Discharge: 2009-05-01 | Payer: Self-pay | Admitting: Internal Medicine

## 2009-05-12 ENCOUNTER — Encounter: Payer: Self-pay | Admitting: Family Medicine

## 2009-05-20 ENCOUNTER — Ambulatory Visit: Payer: Self-pay | Admitting: Family Medicine

## 2009-05-20 LAB — CONVERTED CEMR LAB
Blood in Urine, dipstick: NEGATIVE
Nitrite: NEGATIVE
Specific Gravity, Urine: 1.015
WBC Urine, dipstick: NEGATIVE

## 2009-05-21 ENCOUNTER — Encounter: Payer: Self-pay | Admitting: Family Medicine

## 2009-06-03 ENCOUNTER — Telehealth (INDEPENDENT_AMBULATORY_CARE_PROVIDER_SITE_OTHER): Payer: Self-pay | Admitting: *Deleted

## 2009-06-23 ENCOUNTER — Telehealth (INDEPENDENT_AMBULATORY_CARE_PROVIDER_SITE_OTHER): Payer: Self-pay | Admitting: *Deleted

## 2009-07-08 ENCOUNTER — Encounter: Payer: Self-pay | Admitting: Family Medicine

## 2009-07-14 ENCOUNTER — Encounter: Payer: Self-pay | Admitting: Family Medicine

## 2009-07-28 ENCOUNTER — Encounter: Payer: Self-pay | Admitting: Family Medicine

## 2009-08-04 ENCOUNTER — Encounter: Payer: Self-pay | Admitting: Family Medicine

## 2009-08-11 ENCOUNTER — Ambulatory Visit: Payer: Self-pay | Admitting: Family Medicine

## 2009-08-11 DIAGNOSIS — L02219 Cutaneous abscess of trunk, unspecified: Secondary | ICD-10-CM

## 2009-08-11 DIAGNOSIS — L03319 Cellulitis of trunk, unspecified: Secondary | ICD-10-CM

## 2009-08-12 ENCOUNTER — Encounter: Payer: Self-pay | Admitting: Family Medicine

## 2009-08-12 LAB — CONVERTED CEMR LAB
AST: 16 units/L (ref 0–37)
BUN: 19 mg/dL (ref 6–23)
Calcium: 9.6 mg/dL (ref 8.4–10.5)
Chloride: 103 meq/L (ref 96–112)
Creatinine, Ser: 1.14 mg/dL (ref 0.40–1.20)
HDL: 51 mg/dL (ref >39)
Total CHOL/HDL Ratio: 4

## 2009-08-13 ENCOUNTER — Encounter: Payer: Self-pay | Admitting: Family Medicine

## 2009-09-02 ENCOUNTER — Telehealth: Payer: Self-pay | Admitting: Family Medicine

## 2009-09-08 ENCOUNTER — Encounter: Payer: Self-pay | Admitting: Family Medicine

## 2009-09-11 ENCOUNTER — Encounter: Payer: Self-pay | Admitting: Family Medicine

## 2009-09-15 ENCOUNTER — Encounter: Payer: Self-pay | Admitting: Family Medicine

## 2009-09-17 ENCOUNTER — Encounter: Payer: Self-pay | Admitting: Family Medicine

## 2009-09-25 ENCOUNTER — Telehealth (INDEPENDENT_AMBULATORY_CARE_PROVIDER_SITE_OTHER): Payer: Self-pay | Admitting: *Deleted

## 2009-10-21 ENCOUNTER — Ambulatory Visit: Payer: Self-pay | Admitting: Radiology

## 2009-10-21 ENCOUNTER — Emergency Department (HOSPITAL_BASED_OUTPATIENT_CLINIC_OR_DEPARTMENT_OTHER): Admission: EM | Admit: 2009-10-21 | Discharge: 2009-10-21 | Payer: Self-pay | Admitting: Emergency Medicine

## 2009-11-22 ENCOUNTER — Ambulatory Visit: Payer: Self-pay | Admitting: Diagnostic Radiology

## 2009-11-22 ENCOUNTER — Emergency Department (HOSPITAL_BASED_OUTPATIENT_CLINIC_OR_DEPARTMENT_OTHER): Admission: EM | Admit: 2009-11-22 | Discharge: 2009-11-22 | Payer: Self-pay | Admitting: Emergency Medicine

## 2009-12-01 ENCOUNTER — Encounter: Payer: Self-pay | Admitting: Family Medicine

## 2009-12-02 ENCOUNTER — Ambulatory Visit: Payer: Self-pay | Admitting: Family Medicine

## 2009-12-02 DIAGNOSIS — S92309A Fracture of unspecified metatarsal bone(s), unspecified foot, initial encounter for closed fracture: Secondary | ICD-10-CM | POA: Insufficient documentation

## 2009-12-02 LAB — CONVERTED CEMR LAB
Glucose, Urine, Semiquant: NEGATIVE
Protein, U semiquant: NEGATIVE
Urobilinogen, UA: 0.2
pH: 5.5

## 2010-01-06 ENCOUNTER — Encounter: Payer: Self-pay | Admitting: Family Medicine

## 2010-01-06 ENCOUNTER — Ambulatory Visit
Admission: RE | Admit: 2010-01-06 | Discharge: 2010-01-06 | Payer: Self-pay | Source: Home / Self Care | Attending: Family Medicine | Admitting: Family Medicine

## 2010-01-06 DIAGNOSIS — R1031 Right lower quadrant pain: Secondary | ICD-10-CM | POA: Insufficient documentation

## 2010-01-06 LAB — CONVERTED CEMR LAB
ALT: 16 units/L (ref 0–35)
AST: 17 units/L (ref 0–37)
Albumin: 3.6 g/dL (ref 3.5–5.2)
Basophils Absolute: 0 10*3/uL (ref 0.0–0.1)
Basophils Relative: 0 % (ref 0–1)
Calcium: 9.7 mg/dL (ref 8.4–10.5)
Chloride: 101 meq/L (ref 96–112)
Creatinine, Ser: 1 mg/dL (ref 0.40–1.20)
MCHC: 33.2 g/dL (ref 30.0–36.0)
Neutro Abs: 4 10*3/uL (ref 1.7–7.7)
Neutrophils Relative %: 53 % (ref 43–77)
Platelets: 211 10*3/uL (ref 150–400)
Potassium: 4.9 meq/L (ref 3.5–5.3)
RDW: 12.6 % (ref 11.5–15.5)
Sodium: 138 meq/L (ref 135–145)

## 2010-01-07 ENCOUNTER — Encounter
Admission: RE | Admit: 2010-01-07 | Discharge: 2010-01-07 | Payer: Self-pay | Source: Home / Self Care | Attending: Family Medicine | Admitting: Family Medicine

## 2010-01-24 ENCOUNTER — Encounter: Payer: Self-pay | Admitting: Neurology

## 2010-02-04 NOTE — Progress Notes (Signed)
Summary: bad cough--need [name] to visit her  Phone Note Call from Patient   Caller: Patient Summary of Call: Dr.Bowen  Patient daughter called and want to know if we can call [name] so they can go out to her and check her because she still has a bad cough Initial call taken by: Vanessa Swazil[place],  June 23, 2009 3:33 PM  Follow-up for Phone Call        Pls call [name] and see if they can come out to check her lungs and do a pulse ox. Follow-up by: Seymour Bars DO,  June 23, 2009 3:40 PM  Additional Follow-up for Phone Call Additional follow up Details #1::        [name] will have Leslie Asp go out to check on Pt.  Additional Follow-up by: Payton Spark CMA,  June 23, 2009 3:47 PM

## 2010-02-04 NOTE — Miscellaneous (Signed)
Summary: [facility]  [facility]   Imported By: Lanelle Bal 05/08/2009 13:09:19  _____________________________________________________________________  External Attachment:    Type:   Image     Comment:   External Document

## 2010-02-04 NOTE — Consult Note (Signed)
Summary: [facility]  [facility]   Imported By: Lanelle Bal 03/04/2009 70:35:00  _____________________________________________________________________  External Attachment:    Type:   Image     Comment:   External Document

## 2010-02-04 NOTE — Progress Notes (Signed)
Summary: f/u urine cx.  Phone Note Other Incoming   Summary of Call: I recieved a fax w/ her urine cx that is + for Klebsiella.  I want to see if she is in the hospital or back at [facility] and check to see if she is currently on abx.   her cx is resistant to ampicillin and macrobid. Initial call taken by: Seymour Bars DO,  February 19, 2009 12:40 PM  Follow-up for Phone Call        [facility] for daughter to [facility] w/ info.  Follow-up by: Payton Spark CMA,  February 19, 2009 1:17 PM  Additional Follow-up for Phone Call Additional follow up Details #1::        Daughter states that is the culture from last week. Pt is at [facility] and has not taken anything besides macrobid. Additional Follow-up by: Payton Spark CMA,  February 19, 2009 4:25 PM    New/Updated Medications: CIPRO 500 MG TABS (CIPROFLOXACIN HCL) 1 tab by mouth q12 hrs x 7 days Prescriptions: CIPRO 500 MG TABS (CIPROFLOXACIN HCL) 1 tab by mouth q12 hrs x 7 days  #14 x 0   Entered and Authorized by:   Seymour Bars DO   Signed by:   Seymour Bars DO on 02/19/2009   Method used:   Print then Give to Patient   RxID:   [id number]   Appended Document: f/u urine cx. Daughter aware and Rx faxed

## 2010-02-04 NOTE — Assessment & Plan Note (Signed)
Summary: PNA   Vital Signs:  Patient profile:   77 year old female Height:      64.5 inches Weight:      191 pounds O2 Sat:      96 % on Room air Temp:     98.2 degrees F oral Pulse rate:   93 / minute BP sitting:   155 / 73  (left arm) Cuff size:   large  Vitals Entered By: Payton Spark CMA (April 20, 2009 2:41 PM)  O2 Flow:  Room air CC: Chest congestion, cough, runny nose and upper back pain x 5 days.    Primary Care Provider:  Seymour Bars D.O.  CC:  Chest congestion, cough, and runny nose and upper back pain x 5 days. Marland Kitchen  History of Present Illness: 58 yr WF presents with cough and chest congestion x7 days. She has fatigue and dizziness. Her cough is productive, green sputum with SOB. She has a sore throat. Her nose is itchy and runny, this am she blew bloody nasal discharge. She has chest discomfort and tenderness in her back at the shoulder blades. She has nausea and diarrhea, no vomiting. She is trying to drink extra fluids. She is not eating good d/t decreased appetite, she has lost 6 lbs since 04/07/2009. She is not sleeping well.  She is taking regular Robitussen for her cough.   She fell out of the bed Saturday night and hit the floor. She was taken to the [facility] in [place]. Her x-rays were negative. She has a bruise to her right hip.   Allergies (verified): 1)  ! Codeine Sulfate (Codeine Sulfate)  Past History:  Past Medical History: Reviewed history from 01/07/2009 and no changes required. MRSA infection x 2 , 2007 CAD CHF OAB DM type II 2004 osteoarthritis high cholestrol HTN nephrolithiasis lumbar DDD CRF with GFR 30- Stage 3 CKD  [facility] cardiology GI Dr Rhetta Mura  Past Surgical History: Reviewed history from 02/26/2007 and no changes required. pacemaker 2006 appendectomy hysterectomy cholecystectomy back surgery x 2 CEA 4-07 L hip replacment 6-07 kidney stone removal  Lumbar fusion cataract surgery ankle surgery  Family  History: Reviewed history from 11/04/2005 and no changes required. mother heard dz, HTN, DM sister breast cancer  Social History: Reviewed history from 12/18/2006 and no changes required. Retired from [employer] Never Smoked 3 grown children Widowed.  Lifes in ALF in [place].  Ambulates w/ walker. Alcohol use-no Regular exercise-no  Review of Systems General:  Complains of chills, fatigue, malaise, sweats, weakness, and weight loss; denies fever. ENT:  Complains of nasal congestion, postnasal drainage, and sore throat. CV:  Complains of shortness of breath with exertion; denies chest pain or discomfort and swelling of feet. Resp:  Complains of cough and shortness of breath. GI:  Complains of nausea and vomiting; denies abdominal pain, constipation, and diarrhea. MS:  Complains of joint pain.  Physical Exam  General:  alert, well-developed, overweight-appearing, and pale.  in wheelchair.  here with daughter Harriett Sine Head:  normocephalic, atraumatic, and no abnormalities observed.   Eyes:  Conjuctiva clear.  Nose:  no external deformity and no mucosal edema.  nasal congestion Mouth:  pharynx pink and moist.  o/p w/o injection.  slightly hoarse Lungs:  Bibasilar crackles and diminished breath sounds in the bases.   dry cough Heart:  normal rate, regular rhythm, and no murmur.   Pulses:  R radial normal and L radial normal.   Extremities:  1+ left pedal edema and 1+ right  pedal edema.   Skin:  turgor normal and no rashes.  diaphoretic Cervical Nodes:  no anterior cervical chain LA Psych:  normally interactive, good eye contact, not anxious appearing, and not depressed appearing.     Impression & Recommendations:  Problem # 1:  PNEUMONIA, ORGANISM UNSPECIFIED (ICD-486) Clinically, she appears to have pneumonia given acute fatigue/ malaise, SOB, anorexia and wt loss, decreased BS at the bases and coiugh with nausea and vomitting.  CXR to confirm and r/o pulmonary edema.   Empirically treat with  Avelox.   Go to ER if symptoms worsen.  Use Robitussin as needed for cough.  Orders: T-DG Chest 2 View [id number])  Her updated medication list for this problem includes:    Avelox 400 Mg Tabs (Moxifloxacin hcl) .Marland Kitchen... 1 tab by mouth once a day x 10 days  Complete Medication List: 1)  Nexium 40 Mg Cpdr (Esomeprazole magnesium) .... Take 1 tablet by mouth two times a day 2)  Isosorbide Mononitrate Cr 30 Mg Xr24h-tab (Isosorbide mononitrate) .... Take 1 tablet by mouth once a day 3)  Calcarb 600/d 600-125 Mg-unit Tabs (Calcium-vitamin d) .Marland Kitchen.. 1 tab by mouth bid 4)  Lisinopril 10 Mg Tabs (Lisinopril) .Marland Kitchen.. 1 tab by mouth daily 5)  Nitroglycerin 0.4 Mg Subl (Nitroglycerin) .... Prn 6)  Tylenol 325 Mg Tabs (Acetaminophen) .... 2 tab q6 h prn 7)  Furosemide 40 Mg Tabs (Furosemide) .Marland Kitchen.. 1 tab by mouth qam x 7 days then back to 20 mg every other day. 8)  Miralax Powd (Polyethylene glycol 3350) .Marland KitchenMarland KitchenMarland Kitchen 17 g by mouth daily as needed 9)  Colace 100 Mg Caps (Docusate sodium) .... Two times a day by mouth 10)  Preparation H 0.25-3-14-71.9 % Oint (Phenyleph-shark liv oil-mo-pet) .... Apply three times a day and after bms as needed 11)  Adult Aspirin Low Strength 81 Mg Tbdp (Aspirin) .... Take 1 tablet by mouth once a day 12)  Meclizine Hcl 12.5 Mg Tabs (Meclizine hcl) .... Take 1 tablet by mouth once a day as needed dizziness 13)  Vicodin 5-500 Mg Tabs (Hydrocodone-acetaminophen) .... Take one by mouth every six hours as needed pain 14)  Januvia 100 Mg Tabs (Sitagliptin phosphate) .Marland Kitchen.. 1 tab by mouth daily 15)  Potassium Chloride Cr 10 Meq Cr-tabs (Potassium chloride) .... Take 1 tablet by mouth once a day 16)  Loratadine 10 Mg Tabs (Loratadine) .Marland Kitchen.. 1 tab by mouth daily 17)  Sanctura Xr 60 Mg Xr24h-cap (Trospium chloride) .Marland Kitchen.. 1 capsule by mouth daily 18)  Zofran 8 Mg Tabs (Ondansetron hcl) .Marland Kitchen.. 1 tab by mouth q 8 hrs as needed nausea 19)  Gas-x 80 Mg Chew (Simethicone) .Marland Kitchen.. 1 tab by mouth q 4 hrs as needed  gas pain 20)  Premarin 0.625 Mg/gm Crea (Estrogens, conjugated) .... Use pervaginal are 3 x a wk 21)  Zolpidem Tartrate 10 Mg Tabs (Zolpidem tartrate) .... Take 1 tab by mouth at bedtime as needed 22)  Bupropion Hcl 200 Mg Xr12h-tab (Bupropion hcl) .... Take 1 tab by mouth two times a day 23)  Alprazolam 0.5 Mg Tbdp (Alprazolam) .Marland Kitchen.. 1 tab by mouth two times a day as needed anxiety 24)  Fleets Enema  .... Use enema x1; repeat in 12 hrs if no results for a max of 2. 25)  Prune Juice 8 Oz + Benefiber Mixed in  .... Drink daily as directed 26)  Dulcolax 10 Mg Supp (Bisacodyl) .Marland Kitchen.. 1 suppository pr daily as needed for constipation 27)  Glimepiride 1 Mg Tabs (Glimepiride) .Marland KitchenMarland KitchenMarland Kitchen  1 tab by mouth daily with lunch 28)  Manual Wheelchair  .... Use as directed weakness, diabetic neuropathy 29)  Pravastatin Sodium 40 Mg Tabs (Pravastatin sodium) .Marland Kitchen.. 1 tab by mouth qhs 30)  Avelox 400 Mg Tabs (Moxifloxacin hcl) .Marland Kitchen.. 1 tab by mouth once a day x 10 days  Patient Instructions: 1)  CXR today. 2)  Will call you w/ results tomorrow. 3)  Go ahead and start Avelox daily for probable pneumonia. 4)  Use Robitussin as needed for cough. Prescriptions: AVELOX 400 MG TABS (MOXIFLOXACIN HCL) 1 tab by mouth once a day x 10 days  #10 tabs x 0   Entered and Authorized by:   Seymour Bars DO   Signed by:   Seymour Bars DO on 04/20/2009   Method used:   Print then Give to Patient   RxID:   [id number]

## 2010-02-04 NOTE — Miscellaneous (Signed)
Summary: Robitussin Order/[facility]  Robitussin Order/[facility]   Imported By: Lanelle Bal 02/10/2009 11:10:25  _____________________________________________________________________  External Attachment:    Type:   Image     Comment:   External Document

## 2010-02-04 NOTE — Miscellaneous (Signed)
Summary: [facility]  Diet Order/[facility]   Imported By: Lanelle Bal 07/20/2009 14:06:46  _____________________________________________________________________  External Attachment:    Type:   Image     Comment:   External Document

## 2010-02-04 NOTE — Consult Note (Signed)
Summary: [facility]  [facility]   Imported By: Lanelle Bal 09/22/2009 13:25:37  _____________________________________________________________________  External Attachment:    Type:   Image     Comment:   External Document

## 2010-02-04 NOTE — Progress Notes (Signed)
Summary: Med changes  Phone Note Call from Patient   Caller: Daughter Summary of Call: Daughter [facility] stating [facility] has requested to have Pt changed from alprazolam to ativan or clonazepam but Pt does not want to change. Daughter requests that the requests sent to you be denied. Initial call taken by: Payton Spark CMA,  September 02, 2009 10:40 AM  Follow-up for Phone Call        ok, haven't seen anything on my desk as of yet. Follow-up by: Seymour Bars DO,  September 02, 2009 11:03 AM

## 2010-02-04 NOTE — Miscellaneous (Signed)
Summary: A[facility] Order/[facility]  A[facility] Order/[facility]   Imported By: Lanelle Bal 08/13/2009 12:34:18  _____________________________________________________________________  External Attachment:    Type:   Image     Comment:   External Document

## 2010-02-04 NOTE — Miscellaneous (Signed)
Summary: changed alprazolam to clonazepam  Clinical Lists Changes  Medications: Changed medication from ALPRAZOLAM 0.5 MG TBDP (ALPRAZOLAM) 1 tab by mouth two times a day as needed anxiety to CLONAZEPAM 0.5 MG TABS (CLONAZEPAM) 1 tab by mouth two times a day as needed anxiety  Appended Document: changed alprazolam to clonazepam Daughter called back stating Pt has tried clonazepam a few times in the past and it does not work well for Pt. Daughter requests that Pt not be changed at this time. Please advise.  Appended Document: changed alprazolam to clonazepam Pls call the pharmacist and find out why they wanted her changed at her ALF.  Seymour Bars, D.O.  Appended Document: changed alprazolam to clonazepam Called [facility] to find out reason for change and was told that [facility] recommended trying clonazepam to help w/ some social anxiety. [facility] needs D/C order for clonazepam and new Rx for alprazolam. Arvilla Market CMA, [facility] July 28, 2009 4:34 PM   Appended Document: changed alprazolam to clonazepam order faxed

## 2010-02-04 NOTE — Assessment & Plan Note (Signed)
Summary: PNA   Vital Signs:  Patient profile:   77 year old female Height:      64.5 inches Weight:      192 pounds O2 Sat:      98 % on Room air Temp:     98.4 degrees F oral Pulse rate:   76 / minute BP sitting:   129 / 79  (left arm) Cuff size:   large  Vitals Entered By: Payton Spark CMA (April 29, 2009 10:58 AM)  O2 Flow:  Room air  Serial Vital Signs/Assessments:  Comments: 11:29 AM Peak Flow 150 RED zone By: Payton Spark CMA   CC: F/U pneumonia. Feeling worse. Daughter giving her Robitussin and Mucinex but it is not helping.    Primary Care Provider:  Seymour Bars D.O.  CC:  F/U pneumonia. Feeling worse. Daughter giving her Robitussin and Mucinex but it is not helping. Marland Kitchen  History of Present Illness: 77 yo WF presents for f/u of probable pneumonia from 9 days.  She had to change AVELOX to doxycycline b/c her insurance wouldn't cover the cost.  She has 2 more days of Doxycycline.  She has a poor appetite and c/o feeling week.  She had another fall in the shower 2 days ago, striking her forehead.  No LOC.  Has a lump there now.  She is coughing a lot and bring up some mucous.  Daughter is giving her Mucinex which does not helping much.  She is tired even though she is sleeping fairly well.  She has SOB.  Her sputum is green.  she has a little runny nose, subjective fevers.  She has chronic nausea.   Denies any diarrhea or vomitting.    CXR from 4-18 showed only low lung volumes and atx but she was early on in the clinical course.    Current Medications (verified): 1)  Nexium 40 Mg Cpdr (Esomeprazole Magnesium) .... Take 1 Tablet By Mouth Two Times A Day 2)  Isosorbide Mononitrate Cr 30 Mg  Xr24h-Tab (Isosorbide Mononitrate) .... Take 1 Tablet By Mouth Once A Day 3)  Calcarb 600/d 600-125 Mg-Unit Tabs (Calcium-Vitamin D) .Marland Kitchen.. 1 Tab By Mouth Bid 4)  Lisinopril 10 Mg Tabs (Lisinopril) .Marland Kitchen.. 1 Tab By Mouth Daily 5)  Nitroglycerin 0.4 Mg Subl (Nitroglycerin) .... Prn 6)   Tylenol 325 Mg Tabs (Acetaminophen) .... 2 Tab Q6 H Prn 7)  Furosemide 40 Mg Tabs (Furosemide) .Marland Kitchen.. 1 Tab By Mouth Qam X 7 Days Then Back To 20 Mg Every Other Day. 8)  Miralax  Powd (Polyethylene Glycol 3350) .Marland KitchenMarland KitchenMarland Kitchen 17 G By Mouth Daily As Needed 9)  Colace 100 Mg Caps (Docusate Sodium) .... Two Times A Day By Mouth 10)  Preparation H 0.25-3-14-71.9 % Oint (Phenyleph-Shark Liv Oil-Mo-Pet) .... Apply Three Times A Day and After Bms As Needed 11)  Adult Aspirin Low Strength 81 Mg  Tbdp (Aspirin) .... Take 1 Tablet By Mouth Once A Day 12)  Meclizine Hcl 12.5 Mg  Tabs (Meclizine Hcl) .... Take 1 Tablet By Mouth Once A Day As Needed Dizziness 13)  Vicodin 5-500 Mg  Tabs (Hydrocodone-Acetaminophen) .... Take One By Mouth Every Six Hours As Needed Pain 14)  Januvia 100 Mg Tabs (Sitagliptin Phosphate) .Marland Kitchen.. 1 Tab By Mouth Daily 15)  Potassium Chloride Cr 10 Meq Cr-Tabs (Potassium Chloride) .... Take 1 Tablet By Mouth Once A Day 16)  Loratadine 10 Mg Tabs (Loratadine) .Marland Kitchen.. 1 Tab By Mouth Daily 17)  Sanctura Xr 60 Mg Xr24h-Cap (Trospium  Chloride) .Marland Kitchen.. 1 Capsule By Mouth Daily 18)  Zofran 8 Mg Tabs (Ondansetron Hcl) .Marland Kitchen.. 1 Tab By Mouth Q 8 Hrs As Needed Nausea 19)  Gas-X 80 Mg Chew (Simethicone) .Marland Kitchen.. 1 Tab By Mouth Q 4 Hrs As Needed Gas Pain 20)  Premarin 0.625 Mg/gm Crea (Estrogens, Conjugated) .... Use Pervaginal Are 3 X A Wk 21)  Zolpidem Tartrate 10 Mg Tabs (Zolpidem Tartrate) .... Take 1 Tab By Mouth At Bedtime As Needed 22)  Bupropion Hcl 200 Mg Xr12h-Tab (Bupropion Hcl) .... Take 1 Tab By Mouth Two Times A Day 23)  Alprazolam 0.5 Mg Tbdp (Alprazolam) .Marland Kitchen.. 1 Tab By Mouth Two Times A Day As Needed Anxiety 24)  Fleets Enema .... Use Enema X1; Repeat in 12 Hrs If No Results For A Max of 2. 25)  Prune Juice 8 Oz + Benefiber Mixed in .... Drink Daily As Directed 26)  Dulcolax 10 Mg Supp (Bisacodyl) .Marland Kitchen.. 1 Suppository Pr Daily As Needed For Constipation 27)  Glimepiride 1 Mg Tabs (Glimepiride) .Marland Kitchen.. 1 Tab By  Mouth Daily With Lunch 28)  Manual Wheelchair .... Use As Directed Weakness, Diabetic Neuropathy 29)  Pravastatin Sodium 40 Mg Tabs (Pravastatin Sodium) .Marland Kitchen.. 1 Tab By Mouth Qhs 30)  Doxycycline Hyclate 100 Mg Caps (Doxycycline Hyclate) .Marland Kitchen.. 1 Tab By Mouth Two Times A Day X 10 Days  Allergies (verified): 1)  ! Codeine Sulfate (Codeine Sulfate)  Past History:  Past Medical History: Reviewed history from 01/07/2009 and no changes required. MRSA infection x 2 , 2007 CAD CHF OAB DM type II 2004 osteoarthritis high cholestrol HTN nephrolithiasis lumbar DDD CRF with GFR 30- Stage 3 CKD  [facility] cardiology GI Dr Rhetta Mura  Past Surgical History: Reviewed history from 02/26/2007 and no changes required. pacemaker 2006 appendectomy hysterectomy cholecystectomy back surgery x 2 CEA 4-07 L hip replacment 6-07 kidney stone removal  Lumbar fusion cataract surgery ankle surgery  Family History: Reviewed history from 11/04/2005 and no changes required. mother heard dz, HTN, DM sister breast cancer  Social History: Reviewed history from 12/18/2006 and no changes required. Retired from [employer] Never Smoked 3 grown children Widowed.  Lifes in ALF in [place].  Ambulates w/ walker. Alcohol use-no Regular exercise-no  Review of Systems      See HPI  Physical Exam  General:  alert, well-developed, overweight-appearing, and pale.  in wheelchair.  here with daughter Harriett Sine Head:  normocephalic and atraumatic.   Eyes:  conjunctiva clear Ears:  no external deformities.   Nose:  clear rhinorrhea Mouth:  pharynx pink and moist.   Neck:  no masses.   Lungs:  fair air movement over the bases without W/R/R.  nonlabored.  slight rhonchi only with cough with poor cough effort Heart:  normal rate, regular rhythm, and no murmur.   Abdomen:  soft and non-tender.   Extremities:  1+ left pedal edema and 1+ right pedal edema.   Skin:  color normal.   Psych:  depressed affect.     Impression &  Recommendations:  Problem # 1:  PNEUMONIA, ORGANISM UNSPECIFIED (ICD-486) On day 8/ 10 of Doxycline + Mucinex.  4-18 CXR was neg for infiltrate but she had low lung volumes and it was early in her presentation.  REcheck today.  PFs today in the RED zone.  DuoNeb tried today for dyspnea/ cough without any relief.  We discussed options to continue outpt treatment given the fact that she has a good pulse ox, fairly good lung exam and is hemodynamically stable  but Ms Reierson 'feels bad' with extreme fatigue, subjective dyspnea and no appetitie for going on 10 days now, not improving with Doxy + Mucinex and did NOT improve after DuoNeb in the office today.  Daughter and pt wish to have direct admit to [facility]  Sending her over today for direct admit.  Will need labs and a CXR.  She is stable and not requiring O2.  I will plan to see her back in 2 wks for f/u.   Her updated medication list for this problem includes:    Doxycycline Hyclate 100 Mg Caps (Doxycycline hyclate) .Marland Kitchen... 1 tab by mouth two times a day x 10 days  Orders: T-Chest x-ray, 2 views (71020) Albuterol Sulfate Sol 1mg  unit dose (E4540) Ipratropium inhalation sol. unit dose (J8119)  Complete Medication List: 1)  Nexium 40 Mg Cpdr (Esomeprazole magnesium) .... Take 1 tablet by mouth two times a day 2)  Isosorbide Mononitrate Cr 30 Mg Xr24h-tab (Isosorbide mononitrate) .... Take 1 tablet by mouth once a day 3)  Calcarb 600/d 600-125 Mg-unit Tabs (Calcium-vitamin d) .Marland Kitchen.. 1 tab by mouth bid 4)  Lisinopril 10 Mg Tabs (Lisinopril) .Marland Kitchen.. 1 tab by mouth daily 5)  Nitroglycerin 0.4 Mg Subl (Nitroglycerin) .... Prn 6)  Tylenol 325 Mg Tabs (Acetaminophen) .... 2 tab q6 h prn 7)  Furosemide 40 Mg Tabs (Furosemide) .Marland Kitchen.. 1 tab by mouth qam x 7 days then back to 20 mg every other day. 8)  Miralax Powd (Polyethylene glycol 3350) .Marland KitchenMarland KitchenMarland Kitchen 17 g by mouth daily as needed 9)  Colace 100 Mg Caps (Docusate sodium) .... Two times a day by mouth 10)  Preparation H  0.25-3-14-71.9 % Oint (Phenyleph-shark liv oil-mo-pet) .... Apply three times a day and after bms as needed 11)  Adult Aspirin Low Strength 81 Mg Tbdp (Aspirin) .... Take 1 tablet by mouth once a day 12)  Meclizine Hcl 12.5 Mg Tabs (Meclizine hcl) .... Take 1 tablet by mouth once a day as needed dizziness 13)  Vicodin 5-500 Mg Tabs (Hydrocodone-acetaminophen) .... Take one by mouth every six hours as needed pain 14)  Januvia 100 Mg Tabs (Sitagliptin phosphate) .Marland Kitchen.. 1 tab by mouth daily 15)  Potassium Chloride Cr 10 Meq Cr-tabs (Potassium chloride) .... Take 1 tablet by mouth once a day 16)  Loratadine 10 Mg Tabs (Loratadine) .Marland Kitchen.. 1 tab by mouth daily 17)  Sanctura Xr 60 Mg Xr24h-cap (Trospium chloride) .Marland Kitchen.. 1 capsule by mouth daily 18)  Zofran 8 Mg Tabs (Ondansetron hcl) .Marland Kitchen.. 1 tab by mouth q 8 hrs as needed nausea 19)  Gas-x 80 Mg Chew (Simethicone) .Marland Kitchen.. 1 tab by mouth q 4 hrs as needed gas pain 20)  Premarin 0.625 Mg/gm Crea (Estrogens, conjugated) .... Use pervaginal are 3 x a wk 21)  Zolpidem Tartrate 10 Mg Tabs (Zolpidem tartrate) .... Take 1 tab by mouth at bedtime as needed 22)  Bupropion Hcl 200 Mg Xr12h-tab (Bupropion hcl) .... Take 1 tab by mouth two times a day 23)  Alprazolam 0.5 Mg Tbdp (Alprazolam) .Marland Kitchen.. 1 tab by mouth two times a day as needed anxiety 24)  Fleets Enema  .... Use enema x1; repeat in 12 hrs if no results for a max of 2. 25)  Prune Juice 8 Oz + Benefiber Mixed in  .... Drink daily as directed 26)  Dulcolax 10 Mg Supp (Bisacodyl) .Marland Kitchen.. 1 suppository pr daily as needed for constipation 27)  Glimepiride 1 Mg Tabs (Glimepiride) .Marland Kitchen.. 1 tab by mouth daily with lunch 28)  Manual Wheelchair  .... Use as directed weakness, diabetic neuropathy 29)  Pravastatin Sodium 40 Mg Tabs (Pravastatin sodium) .Marland Kitchen.. 1 tab by mouth qhs 30)  Doxycycline Hyclate 100 Mg Caps (Doxycycline hyclate) .Marland Kitchen.. 1 tab by mouth two times a day x 10 days   Medication Administration  Medication # 1:     Medication: Albuterol Sulfate Sol 1mg  unit dose    Diagnosis: PNEUMONIA, ORGANISM UNSPECIFIED (ICD-486)    Dose: 2.5mg     Route: inhaled    Exp Date: 12/12    Lot #: [id number]    Patient tolerated medication without complications    Given by: Payton Spark CMA (April 29, 2009 11:30 AM)  Medication # 2:    Medication: Ipratropium inhalation sol. unit dose    Diagnosis: PNEUMONIA, ORGANISM UNSPECIFIED (ICD-486)    Dose: 0.5    Route: inhaled    Exp Date: 08/12    Lot #: [id number]    Patient tolerated medication without complications    Given by: Payton Spark CMA (April 29, 2009 11:31 AM)  Orders Added: 1)  T-Chest x-ray, 2 views [71020] 2)  Est. Patient Level IV [82956] 3)  Albuterol Sulfate Sol 1mg  unit dose [O1308] 4)  Ipratropium inhalation sol. unit dose [M5784]

## 2010-02-04 NOTE — Medication Information (Signed)
Summary: [facility]  Med Recommendations/[facility]   Imported By: Lanelle Bal 09/17/2009 11:17:16  _____________________________________________________________________  External Attachment:    Type:   Image     Comment:   External Document

## 2010-02-04 NOTE — Miscellaneous (Signed)
Summary: [facility]  [facility]   Imported By: Lanelle Bal 02/09/2009 11:15:29  _____________________________________________________________________  External Attachment:    Type:   Image     Comment:   External Document

## 2010-02-04 NOTE — Progress Notes (Signed)
Summary: Physician Visit [facility]  Physician Visit Form/[place] [facility]   Imported By: Lanelle Bal 01/15/2009 11:46:51  _____________________________________________________________________  External Attachment:    Type:   Image     Comment:   External Document

## 2010-02-04 NOTE — Progress Notes (Signed)
Summary: ? UTI  Phone Note Call from Patient   Caller: Daughter Summary of Call: Pts daughter called stating Pt is having dysuria and urgency again. Daughter would like [facility] to come out for UA and culture. Please advise. Initial call taken by: Payton Spark CMA,  March 31, 2009 11:49 AM  Follow-up for Phone Call        order placed. fax to [facility]. Follow-up by: Seymour Bars DO,  March 31, 2009 12:31 PM     Appended Document: ? UTI order faxed

## 2010-02-04 NOTE — Assessment & Plan Note (Signed)
Summary: RLQ pain   Vital Signs:  Patient profile:   77 year old female Height:      64.5 inches Weight:      201 pounds BMI:     34.09 O2 Sat:      98 % on Room air Temp:     98.5 degrees F oral Pulse rate:   60 / minute BP sitting:   129 / 68  (left arm) Cuff size:   large  Vitals Entered By: Payton Spark CMA (January 06, 2010 12:56 PM)  O2 Flow:  Room air CC: R lower abd pain x 1 week. Feels like pulled muscle.    Primary Care Provider:  Seymour Bars D.O.  CC:  R lower abd pain x 1 week. Feels like pulled muscle. [name]  History of Present Illness: 77 yo WF presents for RLQ pain x 5 days.  This started after she was trying to pull herself up.  She is having soft BMs 2-3 x a day help of Miralax.  No diarrhea or blood in her stool or urine.  Denies an increase in LBP but has some chronic LBP.  She denies any nausea, vomitting, anorexia or urinary symptoms.  Denies fevers or chills.  It is worse when she tries to strain of flex her abd, particularly when trying to get out of her hosp bed at her ALF.    Current Medications (verified): 1)  Nexium 40 Mg Cpdr (Esomeprazole Magnesium) .... Take 1 Tablet By Mouth Two Times A Day 2)  Isosorbide Mononitrate Cr 30 Mg  Xr24h-Tab (Isosorbide Mononitrate) .... Take 1 Tablet By Mouth Once A Day 3)  Calcarb 600/d 600-125 Mg-Unit Tabs (Calcium-Vitamin D) .[name].. 1 Tab By Mouth Bid 4)  Nitroglycerin 0.4 Mg Subl (Nitroglycerin) .... Prn 5)  Tylenol 325 Mg Tabs (Acetaminophen) .... 2 Tab Q6 H Prn 6)  Furosemide 40 Mg Tabs (Furosemide) .[name].. 1 Tab By Mouth Qam X 7 Days Then Back To 20 Mg Every Other Day. 7)  Miralax  Powd (Polyethylene Glycol 3350) .Leslie KitchenMarland KitchenMarland [name] 17 G By Mouth Daily As Needed 8)  Colace 100 Mg Caps (Docusate Sodium) .... Two Times A Day By Mouth 9)  Preparation H 0.25-3-14-71.9 % Oint (Phenyleph-Shark Liv Oil-Mo-Pet) .... Apply Three Times A Day and After Bms As Needed 10)  Adult Aspirin Low Strength 81 Mg  Tbdp (Aspirin) .... Take 1 Tablet By  Mouth Once A Day 11)  Meclizine Hcl 12.5 Mg  Tabs (Meclizine Hcl) .... Take 1 Tablet By Mouth Once A Day As Needed Dizziness 12)  Vicodin 5-500 Mg  Tabs (Hydrocodone-Acetaminophen) .... Take One By Mouth Every Six Hours As Needed Pain 13)  Januvia 50 Mg Tabs (Sitagliptin Phosphate) .[name].. 1 Tab By Mouth Daily 14)  Potassium Chloride Cr 10 Meq Cr-Tabs (Potassium Chloride) .... Take 1 Tablet By Mouth Once A Day 15)  Loratadine 10 Mg Tabs (Loratadine) .[name].. 1 Tab By Mouth Daily 16)  Sanctura Xr 60 Mg Xr24h-Cap (Trospium Chloride) .[name].. 1 Capsule By Mouth Daily 17)  Zofran 8 Mg Tabs (Ondansetron Hcl) .[name].. 1 Tab By Mouth Q 8 Hrs As Needed Nausea 18)  Gas-X 80 Mg Chew (Simethicone) .[name].. 1 Tab By Mouth Q 4 Hrs As Needed Gas Pain 19)  Premarin 0.625 Mg/gm Crea (Estrogens, Conjugated) .... Use Pervaginal Are 3 X A Wk 20)  Zolpidem Tartrate 10 Mg Tabs (Zolpidem Tartrate) .... Take 1 Tab By Mouth At Bedtime As Needed 21)  Bupropion Hcl 200 Mg Xr12h-Tab (Bupropion Hcl) .Leslie KitchenMarland KitchenMarland [name]  Take 1 Tab By Mouth Two Times A Day 22)  Clonazepam 0.5 Mg Tabs (Clonazepam) .[name].. 1 Tab By Mouth Two Times A Day As Needed Anxiety 23)  Fleets Enema .... Use Enema X1; Repeat in 12 Hrs If No Results For A Max of 2. 24)  Prune Juice 8 Oz + Benefiber Mixed in .... Drink Daily As Directed 25)  Dulcolax 10 Mg Supp (Bisacodyl) .[name].. 1 Suppository Pr Daily As Needed For Constipation 26)  Glimepiride 1 Mg Tabs (Glimepiride) .[name].. 1 Tab By Mouth Daily With Lunch 27)  Manual Wheelchair .... Use As Directed Weakness, Diabetic Neuropathy 28)  Pravastatin Sodium 40 Mg Tabs (Pravastatin Sodium) .[name].. 1 Tab By Mouth Qhs 29)  Metoprolol Succinate 50 Mg Xr24h-Tab (Metoprolol Succinate) .[name].. 1 Tab By Mouth Daily 30)  Doxepin Hcl 10 Mg Caps (Doxepin Hcl) .[name].. 1 Capsule By Mouth Qhs 31)  Alprazolam 0.5 Mg Tabs (Alprazolam) .[name].. 1 Tab By Mouth Two Times A Day As Needed Anxiety  Allergies (verified): 1)  ! Codeine Sulfate (Codeine Sulfate)  Past  History:  Past Medical History: Reviewed history from 01/07/2009 and no changes required. MRSA infection x 2 , 2007 CAD CHF OAB DM type II 2004 osteoarthritis high cholestrol HTN nephrolithiasis lumbar DDD CRF with GFR 30- Stage 3 CKD  [facility] cardiology GI Dr Rhetta Mura  Past Surgical History: Reviewed history from 02/26/2007 and no changes required. pacemaker 2006 appendectomy hysterectomy cholecystectomy back surgery x 2 CEA 4-07 L hip replacment 6-07 kidney stone removal  Lumbar fusion cataract surgery ankle surgery  Social History: Reviewed history from 12/18/2006 and no changes required. Retired from [employer] Never Smoked 3 grown children Widowed.  Lifes in ALF in [place].  Ambulates w/ walker. Alcohol use-no Regular exercise-no  Review of Systems      See HPI  Physical Exam  General:  alert, well-developed, well-nourished, and well-hydrated.  in wheelchair, here with Harriett Sine in NAD.  obese. Head:  normocephalic and atraumatic.   Eyes:  sclera non icteric Mouth:  pharynx pink and moist.   Neck:  no masses.   Lungs:  normal respiratory effort, no intercostal retractions, no accessory muscle use, no dullness, no crackles, and no wheezes.   Heart:  normal rate, regular rhythm, and no murmur.   Abdomen:  RLQ/ R pelvic TTP with vol guarding.  No palpable hernia.  soft, normal bowel sounds, no distention, no masses, no rigidity, no hepatomegaly, and no splenomegaly.   Extremities:  trace LE edema bilat  Skin:  color normal.   Cervical Nodes:  No lymphadenopathy noted Psych:  good eye contact, not anxious appearing, and not depressed appearing.     Impression & Recommendations:  Problem # 1:  RLQ PAIN (ICD-789.03) Assessment New Hemodynamically stable, afebrile with new onset RLQ pain and no sign of acute abdomen.  Will CT today to r/o hernia.  Labs today.  f/u results tomorrow.  If all is normal, this is likely to be a muscle strain and I've requested bedrails.   Her  updated medication list for this problem includes:    Gas-x 80 Mg Chew (Simethicone) .[name]... 1 tab by mouth q 4 hrs as needed gas pain  Orders: T-CBC w/Diff ([id number]) T-Comprehensive Metabolic Panel ([id number]) T-CT Abdomen/pelvis w (54270)  Complete Medication List: 1)  Nexium 40 Mg Cpdr (Esomeprazole magnesium) .... Take 1 tablet by mouth two times a day 2)  Isosorbide Mononitrate Cr 30 Mg Xr24h-tab (Isosorbide mononitrate) .... Take 1 tablet by mouth once a day 3)  Calcarb 600/d 600-125 Mg-unit Tabs (Calcium-vitamin d) .[name].. 1 tab by mouth bid 4)  Nitroglycerin 0.4 Mg Subl (Nitroglycerin) .... Prn 5)  Tylenol 325 Mg Tabs (Acetaminophen) .... 2 tab q6 h prn 6)  Furosemide 40 Mg Tabs (Furosemide) .[name].. 1 tab by mouth qam x 7 days then back to 20 mg every other day. 7)  Miralax Powd (Polyethylene glycol 3350) .Leslie KitchenMarland KitchenMarland [name] 17 g by mouth daily as needed 8)  Colace 100 Mg Caps (Docusate sodium) .... Two times a day by mouth 9)  Preparation H 0.25-3-14-71.9 % Oint (Phenyleph-shark liv oil-mo-pet) .... Apply three times a day and after bms as needed 10)  Adult Aspirin Low Strength 81 Mg Tbdp (Aspirin) .... Take 1 tablet by mouth once a day 11)  Meclizine Hcl 12.5 Mg Tabs (Meclizine hcl) .... Take 1 tablet by mouth once a day as needed dizziness 12)  Vicodin 5-500 Mg Tabs (Hydrocodone-acetaminophen) .... Take one by mouth every six hours as needed pain 13)  Januvia 50 Mg Tabs (Sitagliptin phosphate) .[name].. 1 tab by mouth daily 14)  Potassium Chloride Cr 10 Meq Cr-tabs (Potassium chloride) .... Take 1 tablet by mouth once a day 15)  Loratadine 10 Mg Tabs (Loratadine) .[name].. 1 tab by mouth daily 16)  Sanctura Xr 60 Mg Xr24h-cap (Trospium chloride) .[name].. 1 capsule by mouth daily 17)  Zofran 8 Mg Tabs (Ondansetron hcl) .[name].. 1 tab by mouth q 8 hrs as needed nausea 18)  Gas-x 80 Mg Chew (Simethicone) .[name].. 1 tab by mouth q 4 hrs as needed gas pain 19)  Premarin 0.625 Mg/gm Crea (Estrogens, conjugated) .... Use  pervaginal are 3 x a wk 20)  Zolpidem Tartrate 10 Mg Tabs (Zolpidem tartrate) .... Take 1 tab by mouth at bedtime as needed 21)  Bupropion Hcl 200 Mg Xr12h-tab (Bupropion hcl) .... Take 1 tab by mouth two times a day 22)  Clonazepam 0.5 Mg Tabs (Clonazepam) .[name].. 1 tab by mouth two times a day as needed anxiety 23)  Fleets Enema  .... Use enema x1; repeat in 12 hrs if no results for a max of 2. 24)  Prune Juice 8 Oz + Benefiber Mixed in  .... Drink daily as directed 25)  Dulcolax 10 Mg Supp (Bisacodyl) .[name].. 1 suppository pr daily as needed for constipation 26)  Glimepiride 1 Mg Tabs (Glimepiride) .[name].. 1 tab by mouth daily with lunch 27)  Manual Wheelchair  .... Use as directed weakness, diabetic neuropathy 28)  Pravastatin Sodium 40 Mg Tabs (Pravastatin sodium) .[name].. 1 tab by mouth qhs 29)  Metoprolol Succinate 50 Mg Xr24h-tab (Metoprolol succinate) .[name].. 1 tab by mouth daily 30)  Doxepin Hcl 10 Mg Caps (Doxepin hcl) .[name].. 1 capsule by mouth qhs 31)  Alprazolam 0.5 Mg Tabs (Alprazolam) .[name].. 1 tab by mouth two times a day as needed anxiety   Orders Added: 1)  T-CBC w/Diff [[id number]] 2)  T-Comprehensive Metabolic Panel [[id number]] 3)  T-CT Abdomen/pelvis w [74177] 4)  Est. Patient Level III [09811]

## 2010-02-04 NOTE — Assessment & Plan Note (Signed)
Summary: HFU cough   Vital Signs:  Patient profile:   77 year old female Height:      64.5 inches Weight:      196 pounds BMI:     33.24 O2 Sat:      95 % on Room air Temp:     98.0 degrees F oral Pulse rate:   79 / minute BP sitting:   154 / 74  Vitals Entered By: Payton Spark CMA (May 20, 2009 11:31 AM)  O2 Flow:  Room air CC: Hosp F/U. Also c/o burning w/ urination.    Primary Care Provider:  Seymour Bars D.O.  CC:  Hosp F/U. Also c/o burning w/ urination. Marland Kitchen  History of Present Illness: 77 yo WF presents for HFU visit.  She was admitted 4/27-4/29 to [facility] for bronchitis and malaise.  Her hospital course was uneventful.  She proved to be negative for pneumonia.  Her cough has improved off the ACE inhibitor.  Her mild hyperkalemia did resolve in the hospital.  Her kidney function remained stabel with a Cr of 1.5.  No changes were made to her diabetes meds.  She was put on [place] which her daughter wants removed due to disorientation and gas.  She was taken off Doxepin which Dr Rhetta Mura (GI) put her on.  Her daughter wants this restarted since she is having daily nausea and indigestion again.    Current Medications (verified): 1)  Nexium 40 Mg Cpdr (Esomeprazole Magnesium) .... Take 1 Tablet By Mouth Two Times A Day 2)  Isosorbide Mononitrate Cr 30 Mg  Xr24h-Tab (Isosorbide Mononitrate) .... Take 1 Tablet By Mouth Once A Day 3)  Calcarb 600/d 600-125 Mg-Unit Tabs (Calcium-Vitamin D) .Marland Kitchen.. 1 Tab By Mouth Bid 4)  Lisinopril 10 Mg Tabs (Lisinopril) .Marland Kitchen.. 1 Tab By Mouth Daily 5)  Nitroglycerin 0.4 Mg Subl (Nitroglycerin) .... Prn 6)  Tylenol 325 Mg Tabs (Acetaminophen) .... 2 Tab Q6 H Prn 7)  Furosemide 40 Mg Tabs (Furosemide) .Marland Kitchen.. 1 Tab By Mouth Qam X 7 Days Then Back To 20 Mg Every Other Day. 8)  Miralax  Powd (Polyethylene Glycol 3350) .Marland KitchenMarland KitchenMarland Kitchen 17 G By Mouth Daily As Needed 9)  Colace 100 Mg Caps (Docusate Sodium) .... Two Times A Day By Mouth 10)  Preparation H  0.25-3-14-71.9 % Oint (Phenyleph-Shark Liv Oil-Mo-Pet) .... Apply Three Times A Day and After Bms As Needed 11)  Adult Aspirin Low Strength 81 Mg  Tbdp (Aspirin) .... Take 1 Tablet By Mouth Once A Day 12)  Meclizine Hcl 12.5 Mg  Tabs (Meclizine Hcl) .... Take 1 Tablet By Mouth Once A Day As Needed Dizziness 13)  Vicodin 5-500 Mg  Tabs (Hydrocodone-Acetaminophen) .... Take One By Mouth Every Six Hours As Needed Pain 14)  Januvia 100 Mg Tabs (Sitagliptin Phosphate) .Marland Kitchen.. 1 Tab By Mouth Daily 15)  Potassium Chloride Cr 10 Meq Cr-Tabs (Potassium Chloride) .... Take 1 Tablet By Mouth Once A Day 16)  Loratadine 10 Mg Tabs (Loratadine) .Marland Kitchen.. 1 Tab By Mouth Daily 17)  Sanctura Xr 60 Mg Xr24h-Cap (Trospium Chloride) .Marland Kitchen.. 1 Capsule By Mouth Daily 18)  Zofran 8 Mg Tabs (Ondansetron Hcl) .Marland Kitchen.. 1 Tab By Mouth Q 8 Hrs As Needed Nausea 19)  Gas-X 80 Mg Chew (Simethicone) .Marland Kitchen.. 1 Tab By Mouth Q 4 Hrs As Needed Gas Pain 20)  Premarin 0.625 Mg/gm Crea (Estrogens, Conjugated) .... Use Pervaginal Are 3 X A Wk 21)  Zolpidem Tartrate 10 Mg Tabs (Zolpidem Tartrate) .... Take 1  Tab By Mouth At Bedtime As Needed 22)  Bupropion Hcl 200 Mg Xr12h-Tab (Bupropion Hcl) .... Take 1 Tab By Mouth Two Times A Day 23)  Alprazolam 0.5 Mg Tbdp (Alprazolam) .Marland Kitchen.. 1 Tab By Mouth Two Times A Day As Needed Anxiety 24)  Fleets Enema .... Use Enema X1; Repeat in 12 Hrs If No Results For A Max of 2. 25)  Prune Juice 8 Oz + Benefiber Mixed in .... Drink Daily As Directed 26)  Dulcolax 10 Mg Supp (Bisacodyl) .Marland Kitchen.. 1 Suppository Pr Daily As Needed For Constipation 27)  Glimepiride 1 Mg Tabs (Glimepiride) .Marland Kitchen.. 1 Tab By Mouth Daily With Lunch 28)  Manual Wheelchair .... Use As Directed Weakness, Diabetic Neuropathy 29)  Pravastatin Sodium 40 Mg Tabs (Pravastatin Sodium) .Marland Kitchen.. 1 Tab By Mouth Qhs  Allergies (verified): 1)  ! Codeine Sulfate (Codeine Sulfate)  Past History:  Past Medical History: Reviewed history from 01/07/2009 and no changes  required. MRSA infection x 2 , 2007 CAD CHF OAB DM type II 2004 osteoarthritis high cholestrol HTN nephrolithiasis lumbar DDD CRF with GFR 30- Stage 3 CKD  [facility] cardiology GI Dr Rhetta Mura  Past Surgical History: Reviewed history from 02/26/2007 and no changes required. pacemaker 2006 appendectomy hysterectomy cholecystectomy back surgery x 2 CEA 4-07 L hip replacment 6-07 kidney stone removal  Lumbar fusion cataract surgery ankle surgery  Social History: Reviewed history from 12/18/2006 and no changes required. Retired from [employer] Never Smoked 3 grown children Widowed.  Lifes in ALF in [place].  Ambulates w/ walker. Alcohol use-no Regular exercise-no  Review of Systems       The patient complains of peripheral edema, abdominal pain, and severe indigestion/heartburn.  The patient denies fever, weight loss, weight gain, vision loss, chest pain, dyspnea on exertion, and headaches.    Physical Exam  General:  alert, well-developed, well-nourished, and well-hydrated.  in wheelchair, overwt.  here with daughter Harriett Sine Head:  normocephalic and atraumatic.   Eyes:  sclera non icteric Nose:  no nasal discharge.   Mouth:  pharynx pink and moist.   Neck:  no masses.   Lungs:  fair air movement over the bases without W/R/R.  nonlabored.  slight rhonchi only with cough with poor cough effort Heart:  normal rate, regular rhythm, and no murmur.   Abdomen:  soft, non-tender, normal bowel sounds, no distention, no masses, and no guarding.   Pulses:  2+ radial pulses Extremities:  1+ pitting LE edema bilat Neurologic:  no tremor Skin:  color normal.   Psych:  depressed affect.     Impression & Recommendations:  Problem # 1:  COUGH (ICD-786.2) Assessment Improved Bronchitis resolved. Cough better off ACEi.   Lung exam clear today.  Problem # 2:  HYPERTENSION, BENIGN ESSENTIAL (ICD-401.1)  BP high off Lisinopril.  I will add Metoprolol on board, 25 mg/ day.  RTC in2 mos. The  following medications were removed from the medication list:    Lisinopril 10 Mg Tabs (Lisinopril) .Marland Kitchen... 1 tab by mouth daily Her updated medication list for this problem includes:    Furosemide 40 Mg Tabs (Furosemide) .Marland Kitchen... 1 tab by mouth qam x 7 days then back to 20 mg every other day.    Metoprolol Succinate 25 Mg Xr24h-tab (Metoprolol succinate) .Marland Kitchen... 1 tab by mouth once a day  BP today: 154/74 Prior BP: 129/79 (04/29/2009)  Labs Reviewed: K+: 4.6 (12/09/2008) Creat: : 1.12 (12/09/2008)   Chol: 156 (01/18/2008)   HDL: 54 (01/18/2008)   LDL: 75 (01/18/2008)  TG: 134 (01/18/2008)  Orders: Prescription Created Electronically [phone])  Problem # 3:  CHRONIC KIDNEY DISEASE UNSPECIFIED (ICD-585.9) Assessment: Unchanged Cr unchanged during hospital stay but I explained that her worsening renal function is likely why her potassium crept up and for now will keep her off ACE inhibitors.  Repeat BMP in 1 wk.  Problem # 4:  DYSPEPSIA (ICD-536.8) I will add Doxepin back on board and remove Ambien at night.  This was really helping her dyspepsia.  She has follow up with Dr Rhetta Mura this summer.  She is doing better back on Nexium also.  Complete Medication List: 1)  Nexium 40 Mg Cpdr (Esomeprazole magnesium) .... Take 1 tablet by mouth two times a day 2)  Isosorbide Mononitrate Cr 30 Mg Xr24h-tab (Isosorbide mononitrate) .... Take 1 tablet by mouth once a day 3)  Calcarb 600/d 600-125 Mg-unit Tabs (Calcium-vitamin d) .Marland Kitchen.. 1 tab by mouth bid 4)  Nitroglycerin 0.4 Mg Subl (Nitroglycerin) .... Prn 5)  Tylenol 325 Mg Tabs (Acetaminophen) .... 2 tab q6 h prn 6)  Furosemide 40 Mg Tabs (Furosemide) .Marland Kitchen.. 1 tab by mouth qam x 7 days then back to 20 mg every other day. 7)  Miralax Powd (Polyethylene glycol 3350) .Marland KitchenMarland KitchenMarland Kitchen 17 g by mouth daily as needed 8)  Colace 100 Mg Caps (Docusate sodium) .... Two times a day by mouth 9)  Preparation H 0.25-3-14-71.9 % Oint (Phenyleph-shark liv oil-mo-pet) .... Apply three  times a day and after bms as needed 10)  Adult Aspirin Low Strength 81 Mg Tbdp (Aspirin) .... Take 1 tablet by mouth once a day 11)  Meclizine Hcl 12.5 Mg Tabs (Meclizine hcl) .... Take 1 tablet by mouth once a day as needed dizziness 12)  Vicodin 5-500 Mg Tabs (Hydrocodone-acetaminophen) .... Take one by mouth every six hours as needed pain 13)  Januvia 100 Mg Tabs (Sitagliptin phosphate) .Marland Kitchen.. 1 tab by mouth daily 14)  Potassium Chloride Cr 10 Meq Cr-tabs (Potassium chloride) .... Take 1 tablet by mouth once a day 15)  Loratadine 10 Mg Tabs (Loratadine) .Marland Kitchen.. 1 tab by mouth daily 16)  Sanctura Xr 60 Mg Xr24h-cap (Trospium chloride) .Marland Kitchen.. 1 capsule by mouth daily 17)  Zofran 8 Mg Tabs (Ondansetron hcl) .Marland Kitchen.. 1 tab by mouth q 8 hrs as needed nausea 18)  Gas-x 80 Mg Chew (Simethicone) .Marland Kitchen.. 1 tab by mouth q 4 hrs as needed gas pain 19)  Premarin 0.625 Mg/gm Crea (Estrogens, conjugated) .... Use pervaginal are 3 x a wk 20)  Zolpidem Tartrate 10 Mg Tabs (Zolpidem tartrate) .... Take 1 tab by mouth at bedtime as needed 21)  Bupropion Hcl 200 Mg Xr12h-tab (Bupropion hcl) .... Take 1 tab by mouth two times a day 22)  Alprazolam 0.5 Mg Tbdp (Alprazolam) .Marland Kitchen.. 1 tab by mouth two times a day as needed anxiety 23)  Fleets Enema  .... Use enema x1; repeat in 12 hrs if no results for a max of 2. 24)  Prune Juice 8 Oz + Benefiber Mixed in  .... Drink daily as directed 25)  Dulcolax 10 Mg Supp (Bisacodyl) .Marland Kitchen.. 1 suppository pr daily as needed for constipation 26)  Glimepiride 1 Mg Tabs (Glimepiride) .Marland Kitchen.. 1 tab by mouth daily with lunch 27)  Manual Wheelchair  .... Use as directed weakness, diabetic neuropathy 28)  Pravastatin Sodium 40 Mg Tabs (Pravastatin sodium) .Marland Kitchen.. 1 tab by mouth qhs 29)  Metoprolol Succinate 25 Mg Xr24h-tab (Metoprolol succinate) .Marland Kitchen.. 1 tab by mouth once a day 30)  Doxepin  Hcl 10 Mg Caps (Doxepin hcl) .Marland Kitchen.. 1 capsule by mouth qhs  Other Orders: UA Dipstick w/o Micro (automated)   (81003) Prescriptions: DOXEPIN HCL 10 MG CAPS (DOXEPIN HCL) 1 capsule by mouth qhs  #30 x 0   Entered and Authorized by:   Seymour Bars DO   Signed by:   Seymour Bars DO on 05/20/2009   Method used:   Electronically to        [facility] [phone] (retail)       [address] [place], [place]  [address]       Ph: [phone] or [phone]       Fax: [phone]   RxID:   [id number] DOXEPIN HCL 10 MG CAPS (DOXEPIN HCL) 1 capsule by mouth qhs  #30 x 3   Entered and Authorized by:   Seymour Bars DO   Signed by:   Seymour Bars DO on 05/20/2009   Method used:   Print then Give to Patient   RxID:   [id number] METOPROLOL SUCCINATE 25 MG XR24H-TAB (METOPROLOL SUCCINATE) 1 tab by mouth once a day  #30 x 3   Entered and Authorized by:   Seymour Bars DO   Signed by:   Seymour Bars DO on 05/20/2009   Method used:   Print then Give to Patient   RxID:   [id number]   Laboratory Results   Urine Tests    Routine Urinalysis   Color: yellow Appearance: Clear Glucose: negative   (Normal Range: Negative) Bilirubin: negative   (Normal Range: Negative) Ketone: negative   (Normal Range: Negative) Spec. Gravity: 1.015   (Normal Range: 1.003-1.035) Blood: negative   (Normal Range: Negative) pH: 7.5   (Normal Range: 5.0-8.0) Protein: negative   (Normal Range: Negative) Urobilinogen: 0.2   (Normal Range: 0-1) Nitrite: negative   (Normal Range: Negative) Leukocyte Esterace: negative   (Normal Range: Negative)

## 2010-02-04 NOTE — Miscellaneous (Signed)
Summary: [facility]  [facility]   Imported By: Lanelle Bal 09/24/2009 10:32:14  _____________________________________________________________________  External Attachment:    Type:   Image     Comment:   External Document

## 2010-02-04 NOTE — Assessment & Plan Note (Signed)
Summary: [name]   Vital Signs:  Patient profile:   77 year old female Height:      64.5 inches Weight:      201 pounds BMI:     34.09 O2 Sat:      98 % on Room air Pulse rate:   60 / minute BP sitting:   134 / 65  (left arm) Cuff size:   large  Vitals Entered By: Payton Spark CMA (December 02, 2009 2:07 PM)  O2 Flow:  Room air CC: [name]. Also c/o burning w/ urination.    Primary Care Provider:  Seymour Bars D.O.  CC:  [name]. Also c/o burning w/ urination. Marland Kitchen  History of Present Illness: 77 yo WF presents for [name].    She fell in October while getting up at [facility]. She wore a cam walker x 3 wks and is now in a post op shoe.  She did not need surgery.  She broke her L 5th metatarsal.  She is still not walking much but is able to bear weight.  Not taking much more than Tylenol for pain.    They are not checking her sugar at [place]  place.  her appetite is good.  She is sociallizing some in the evenings.   She has had some strong smelling urine.        Allergies: 1)  ! Codeine Sulfate (Codeine Sulfate)  Past History:  Past Medical History: Reviewed history from 01/07/2009 and no changes required. MRSA infection x 2 , 2007 CAD CHF OAB [name] type II 2004 osteoarthritis high cholestrol HTN nephrolithiasis lumbar DDD CRF with GFR 30- Stage 3 CKD  [facility] cardiology GI Dr Rhetta Mura  Past Surgical History: Reviewed history from 02/26/2007 and no changes required. pacemaker 2006 appendectomy hysterectomy cholecystectomy back surgery x 2 CEA 4-07 L hip replacment 6-07 kidney stone removal  Lumbar fusion cataract surgery ankle surgery  Social History: Reviewed history from 12/18/2006 and no changes required. Retired from [employer] Never Smoked 3 grown children Widowed.  Lifes in ALF in [place].  Ambulates w/ walker. Alcohol use-no Regular exercise-no  Review of Systems      See HPI  Physical Exam  General:  alert, well-developed, well-nourished, well-hydrated,  and overweight-appearing.  here with Harriett Sine, in wheelchair Head:  normocephalic and atraumatic.   Eyes:  PERRLA Ears:  no external deformities.   Nose:  no nasal discharge.   Mouth:  pharynx pink and dry Neck:  no masses.   Lungs:  normal respiratory effort, no intercostal retractions, no accessory muscle use, no dullness, no crackles, and no wheezes.   Heart:  normal rate, regular rhythm, and no murmur.   Abdomen:  soft and non-tender.  no CVAT Msk:  wearing post op shoe L foot Pulses:  2+ radial pulses Extremities:  trace LE edema bilat  Skin:  color normal.   Psych:  good eye contact, not anxious appearing, and flat affect.     Impression & Recommendations:  Problem # 1:  [name], UNCOMPLICATED, TYPE II (ICD-250.00) Assessment Unchanged A1C 6.4= good! Continue current meds.  Eye exam done in April.  Flu shot UTD.  Urine micro today.  Monofilmament done in April.  Has complication of mild nephropathy.   Her updated medication list for this problem includes:    Adult Aspirin Low Strength 81 Mg Tbdp (Aspirin) .Marland Kitchen... Take 1 tablet by mouth once a day    Januvia 50 Mg Tabs (Sitagliptin phosphate) .Marland Kitchen... 1 tab by  mouth daily    Glimepiride 1 Mg Tabs (Glimepiride) .Marland Kitchen... 1 tab by mouth daily with lunch  Orders: Fingerstick (36416) Hgb A1C (63875IE)  Labs Reviewed: Creat: 1.14 (08/12/2009)     Last Eye Exam: no retinopathy ( Dr Nile Riggs) (04/13/2009) Reviewed HgBA1c results: 6.4 (12/02/2009)  6.4 (08/11/2009)  Problem # 2:  HYPERTENSION, BENIGN ESSENTIAL (ICD-401.1) BP OK on current meds and labs UTD.  -- done 08-2009.  Will continue current meds.   Her updated medication list for this problem includes:    Furosemide 40 Mg Tabs (Furosemide) .Marland Kitchen... 1 tab by mouth qam x 7 days then back to 20 mg every other day.    Metoprolol Succinate 50 Mg Xr24h-tab (Metoprolol succinate) .Marland Kitchen... 1 tab by mouth daily  BP today: 134/65 Prior BP: 147/72 (08/11/2009)  Labs Reviewed: K+: 5.1  (08/12/2009) Creat: : 1.14 (08/12/2009)   Chol: 205 (08/12/2009)   HDL: 51 (08/12/2009)   LDL: 104 (08/12/2009)   TG: 248 (08/12/2009)  Problem # 3:  CHF (ICD-428.0) Stable on current med.   Follows with [facility] for CHF and pacemaker. Her updated medication list for this problem includes:    Furosemide 40 Mg Tabs (Furosemide) .Marland Kitchen... 1 tab by mouth qam x 7 days then back to 20 mg every other day.    Adult Aspirin Low Strength 81 Mg Tbdp (Aspirin) .Marland Kitchen... Take 1 tablet by mouth once a day    Metoprolol Succinate 50 Mg Xr24h-tab (Metoprolol succinate) .Marland Kitchen... 1 tab by mouth daily  Problem # 4:  CLOSED FRACTURE OF METATARSAL BONE (ICD-825.25)  ~6 wks after a fall, sustatining a L 5th metatarsal fx, managed by ortho, s/p cam walker and now in post op shoe.  Not ambulating much anyway and is not requiring additional pain meds.  Complete Medication List: 1)  Nexium 40 Mg Cpdr (Esomeprazole magnesium) .... Take 1 tablet by mouth two times a day 2)  Isosorbide Mononitrate Cr 30 Mg Xr24h-tab (Isosorbide mononitrate) .... Take 1 tablet by mouth once a day 3)  Calcarb 600/d 600-125 Mg-unit Tabs (Calcium-vitamin d) .Marland Kitchen.. 1 tab by mouth bid 4)  Nitroglycerin 0.4 Mg Subl (Nitroglycerin) .... Prn 5)  Tylenol 325 Mg Tabs (Acetaminophen) .... 2 tab q6 h prn 6)  Furosemide 40 Mg Tabs (Furosemide) .Marland Kitchen.. 1 tab by mouth qam x 7 days then back to 20 mg every other day. 7)  Miralax Powd (Polyethylene glycol 3350) .Marland KitchenMarland KitchenMarland Kitchen 17 g by mouth daily as needed 8)  Colace 100 Mg Caps (Docusate sodium) .... Two times a day by mouth 9)  Preparation H 0.25-3-14-71.9 % Oint (Phenyleph-shark liv oil-mo-pet) .... Apply three times a day and after bms as needed 10)  Adult Aspirin Low Strength 81 Mg Tbdp (Aspirin) .... Take 1 tablet by mouth once a day 11)  Meclizine Hcl 12.5 Mg Tabs (Meclizine hcl) .... Take 1 tablet by mouth once a day as needed dizziness 12)  Vicodin 5-500 Mg Tabs (Hydrocodone-acetaminophen) .... Take one by mouth every six  hours as needed pain 13)  Januvia 50 Mg Tabs (Sitagliptin phosphate) .Marland Kitchen.. 1 tab by mouth daily 14)  Potassium Chloride Cr 10 Meq Cr-tabs (Potassium chloride) .... Take 1 tablet by mouth once a day 15)  Loratadine 10 Mg Tabs (Loratadine) .Marland Kitchen.. 1 tab by mouth daily 16)  Sanctura Xr 60 Mg Xr24h-cap (Trospium chloride) .Marland Kitchen.. 1 capsule by mouth daily 17)  Zofran 8 Mg Tabs (Ondansetron hcl) .Marland Kitchen.. 1 tab by mouth q 8 hrs as needed nausea 18)  Gas-x 80  Mg Chew (Simethicone) .Marland Kitchen.. 1 tab by mouth q 4 hrs as needed gas pain 19)  Premarin 0.625 Mg/gm Crea (Estrogens, conjugated) .... Use pervaginal are 3 x a wk 20)  Zolpidem Tartrate 10 Mg Tabs (Zolpidem tartrate) .... Take 1 tab by mouth at bedtime as needed 21)  Bupropion Hcl 200 Mg Xr12h-tab (Bupropion hcl) .... Take 1 tab by mouth two times a day 22)  Clonazepam 0.5 Mg Tabs (Clonazepam) .Marland Kitchen.. 1 tab by mouth two times a day as needed anxiety 23)  Fleets Enema  .... Use enema x1; repeat in 12 hrs if no results for a max of 2. 24)  Prune Juice 8 Oz + Benefiber Mixed in  .... Drink daily as directed 25)  Dulcolax 10 Mg Supp (Bisacodyl) .Marland Kitchen.. 1 suppository pr daily as needed for constipation 26)  Glimepiride 1 Mg Tabs (Glimepiride) .Marland Kitchen.. 1 tab by mouth daily with lunch 27)  Manual Wheelchair  .... Use as directed weakness, diabetic neuropathy 28)  Pravastatin Sodium 40 Mg Tabs (Pravastatin sodium) .Marland Kitchen.. 1 tab by mouth qhs 29)  Metoprolol Succinate 50 Mg Xr24h-tab (Metoprolol succinate) .Marland Kitchen.. 1 tab by mouth daily 30)  Doxepin Hcl 10 Mg Caps (Doxepin hcl) .Marland Kitchen.. 1 capsule by mouth qhs 31)  D/c Clonazepam  32)  Alprazolam 0.5 Mg Tabs (Alprazolam) .Marland Kitchen.. 1 tab by mouth two times a day as needed anxiety  Other Orders: UA Dipstick w/o Micro (automated)  (81003)   Orders Added: 1)  UA Dipstick w/o Micro (automated)  [81003] 2)  Fingerstick [36416] 3)  Hgb A1C [83036QW] 4)  Est. Patient Level IV [19147]    Laboratory Results   Urine Tests    Routine Urinalysis    Color: yellow Appearance: Clear Glucose: negative   (Normal Range: Negative) Bilirubin: negative   (Normal Range: Negative) Ketone: negative   (Normal Range: Negative) Spec. Gravity: >=1.030   (Normal Range: 1.003-1.035) Blood: trace-intact   (Normal Range: Negative) pH: 5.5   (Normal Range: 5.0-8.0) Protein: negative   (Normal Range: Negative) Urobilinogen: 0.2   (Normal Range: 0-1) Nitrite: negative   (Normal Range: Negative) Leukocyte Esterace: small   (Normal Range: Negative)     Blood Tests     HGBA1C: 6.4%   (Normal Range: Non-Diabetic - 3-6%   Control Diabetic - 6-8%)

## 2010-02-04 NOTE — Miscellaneous (Signed)
Summary: [facility]  [facility]   Imported By: Lanelle Bal 04/18/2009 11:49:35  _____________________________________________________________________  External Attachment:    Type:   Image     Comment:   External Document

## 2010-02-04 NOTE — Consult Note (Signed)
Summary: [facility]   Imported By: Lanelle Bal 02/04/2009 09:22:29  _____________________________________________________________________  External Attachment:    Type:   Image     Comment:   External Document

## 2010-02-04 NOTE — Progress Notes (Signed)
Summary: high cholesterol on labs  Phone Note Outgoing Call   Summary of Call: Pls call pt's daughter Harriett Sine and [facility] [phone] and let them know that Ms Habel labs show normal blood counts, normal thyroid, liver and kidney function.  Cholesterol is high.  She will need cholesterol lowering medication to reduce her risk of heart attack and stroke.  She was on Crestor in the past.  Pls check with daughter to see if she had any problems.  It is not under her allergy list.   Initial call taken by: Seymour Bars DO,  April 13, 2009 9:23 AM  Follow-up for Phone Call        pt daughter aware and thinks this will be OK for Pt to restart Follow-up by: Payton Spark CMA,  April 13, 2009 9:37 AM    New/Updated Medications: PRAVASTATIN SODIUM 40 MG TABS (PRAVASTATIN SODIUM) 1 tab by mouth qhs Prescriptions: PRAVASTATIN SODIUM 40 MG TABS (PRAVASTATIN SODIUM) 1 tab by mouth qhs  #30 x 2   Entered and Authorized by:   Seymour Bars DO   Signed by:   Seymour Bars DO on 04/13/2009   Method used:   Print then Give to Patient   RxID:   [id number]   Appended Document: high cholesterol on labs everything faxed to [facility]

## 2010-02-04 NOTE — Medication Information (Signed)
Summary: On[facility]  [facility]ron[facility]   Imported By: Lanelle Bal 02/09/2009 10:41:17  _____________________________________________________________________  External Attachment:    Type:   Image     Comment:   External Document

## 2010-02-04 NOTE — Miscellaneous (Signed)
Summary: no retinopathy  Clinical Lists Changes  Observations: Added new observation of DIAB EYE EX: no retinopathy ( Dr Nile Riggs) (04/13/2009 16:52)

## 2010-02-04 NOTE — Assessment & Plan Note (Signed)
Summary: f/u DM   Vital Signs:  Patient profile:   77 year old female Height:      64.5 inches Weight:      197 pounds O2 Sat:      100 % on Room air Pulse rate:   79 / minute BP sitting:   125 / 69  (left arm) Cuff size:   large  Vitals Entered By: Payton Spark CMA (April 07, 2009 10:50 AM)  O2 Flow:  Room air CC: F/U DM   Primary Care Provider:  Seymour Bars D.O.  CC:  F/U DM.  History of Present Illness: 77 yo WF presents for F/u T2DM.  She has complications of neuropathy and nephropathy.  Her fasting labs are due but she is not fasting today.  She is due for her urine microalbumin and her diabetic eye exam.  Her daughter is going to schedule this for her.  Added glimeperide with lunch last visit and her  A1C has improved from 7.5--> 6.6.  Her AM fastings also look better.  She is supposed to be on a diabetic diet but reports eating 'a lot' of cream potatoes.    She c/o some chronic problems with arthritis and anxiety.  Already on meds for this.  She has an old manual wheelchair which is breaking on the L side.  She is getting out to the dining room more often.    Current Medications (verified): 1)  Nexium 40 Mg Cpdr (Esomeprazole Magnesium) .... Take 1 Tablet By Mouth Two Times A Day 2)  Isosorbide Mononitrate Cr 30 Mg  Xr24h-Tab (Isosorbide Mononitrate) .... Take 1 Tablet By Mouth Once A Day 3)  Calcarb 600/d 600-125 Mg-Unit Tabs (Calcium-Vitamin D) .Marland Kitchen.. 1 Tab By Mouth Bid 4)  Lisinopril 10 Mg Tabs (Lisinopril) .Marland Kitchen.. 1 Tab By Mouth Daily 5)  Nitroglycerin 0.4 Mg Subl (Nitroglycerin) .... Prn 6)  Tylenol 325 Mg Tabs (Acetaminophen) .... 2 Tab Q6 H Prn 7)  Furosemide 40 Mg Tabs (Furosemide) .Marland Kitchen.. 1 Tab By Mouth Qam X 7 Days Then Back To 20 Mg Every Other Day. 8)  Miralax  Powd (Polyethylene Glycol 3350) .Marland KitchenMarland KitchenMarland Kitchen 17 G By Mouth Daily As Needed 9)  Colace 100 Mg Caps (Docusate Sodium) .... Two Times A Day By Mouth 10)  Preparation H 0.25-3-14-71.9 % Oint (Phenyleph-Shark Liv  Oil-Mo-Pet) .... Apply Three Times A Day and After Bms As Needed 11)  Adult Aspirin Low Strength 81 Mg  Tbdp (Aspirin) .... Take 1 Tablet By Mouth Once A Day 12)  Meclizine Hcl 12.5 Mg  Tabs (Meclizine Hcl) .... Take 1 Tablet By Mouth Once A Day As Needed Dizziness 13)  Vicodin 5-500 Mg  Tabs (Hydrocodone-Acetaminophen) .... Take One By Mouth Every Six Hours As Needed Pain 14)  Januvia 100 Mg Tabs (Sitagliptin Phosphate) .Marland Kitchen.. 1 Tab By Mouth Daily 15)  Potassium Chloride Cr 10 Meq Cr-Tabs (Potassium Chloride) .... Take 1 Tablet By Mouth Once A Day 16)  Loratadine 10 Mg Tabs (Loratadine) .Marland Kitchen.. 1 Tab By Mouth Daily 17)  Sanctura Xr 60 Mg Xr24h-Cap (Trospium Chloride) .Marland Kitchen.. 1 Capsule By Mouth Daily 18)  Zofran 8 Mg Tabs (Ondansetron Hcl) .Marland Kitchen.. 1 Tab By Mouth Q 8 Hrs As Needed Nausea 19)  Gas-X 80 Mg Chew (Simethicone) .Marland Kitchen.. 1 Tab By Mouth Q 4 Hrs As Needed Gas Pain 20)  Premarin 0.625 Mg/gm Crea (Estrogens, Conjugated) .... Use Pervaginal Are 3 X A Wk 21)  Zolpidem Tartrate 10 Mg Tabs (Zolpidem Tartrate) .... Take 1  Tab By Mouth At Bedtime As Needed 22)  Bupropion Hcl 200 Mg Xr12h-Tab (Bupropion Hcl) .... Take 1 Tab By Mouth Two Times A Day 23)  Alprazolam 0.5 Mg Tbdp (Alprazolam) .Marland Kitchen.. 1 Tab By Mouth Two Times A Day As Needed Anxiety 24)  Fleets Enema .... Use Enema X1; Repeat in 12 Hrs If No Results For A Max of 2. 25)  Prune Juice 8 Oz + Benefiber Mixed in .... Drink Daily As Directed 26)  Dulcolax 10 Mg Supp (Bisacodyl) .Marland Kitchen.. 1 Suppository Pr Daily As Needed For Constipation 27)  Glimepiride 1 Mg Tabs (Glimepiride) .Marland Kitchen.. 1 Tab By Mouth Daily With Lunch  Allergies (verified): 1)  ! Codeine Sulfate (Codeine Sulfate)  Past History:  Past Medical History: Reviewed history from 01/07/2009 and no changes required. MRSA infection x 2 , 2007 CAD CHF OAB DM type II 2004 osteoarthritis high cholestrol HTN nephrolithiasis lumbar DDD CRF with GFR 30- Stage 3 CKD  [facility] cardiology GI Dr  Rhetta Mura  Past Surgical History: Reviewed history from 02/26/2007 and no changes required. pacemaker 2006 appendectomy hysterectomy cholecystectomy back surgery x 2 CEA 4-07 L hip replacment 6-07 kidney stone removal  Lumbar fusion cataract surgery ankle surgery  Family History: Reviewed history from 11/04/2005 and no changes required. mother heard dz, HTN, DM sister breast cancer  Social History: Reviewed history from 12/18/2006 and no changes required. Retired from [employer] Never Smoked 3 grown children Widowed.  Lifes in ALF in [place].  Ambulates w/ walker. Alcohol use-no Regular exercise-no  Review of Systems      See HPI  Physical Exam  General:  alert, well-developed, well-nourished, well-hydrated, and overweight-appearing.  in wheelchair, here with daughter Harriett Sine Head:  normocephalic and atraumatic.   Eyes:  conjunctiva clear Ears:  no external deformities.   Nose:  no nasal discharge.   Mouth:  pharynx pink and moist.   Neck:  no masses.   Lungs:  normal respiratory effort, no accessory muscle use, and normal breath sounds.   Heart:  RRR w/o audible murmur Abdomen:  soft.   Extremities:  1+ pitting ankle edema bilat Skin:  color normal.   Psych:  good eye contact, not anxious appearing, and not depressed appearing.    Diabetes Management Exam:    Foot Exam (with socks and/or shoes not present):       Sensory-Monofilament:          Left foot: diminished          Right foot: diminished       Inspection:          Left foot: normal          Right foot: normal       Nails:          Left foot: thickened          Right foot: thickened   Impression & Recommendations:  Problem # 1:  DM, UNCOMPLICATED, TYPE II (ICD-250.00) Complicated by neuropathy and nephropathy.  Update fasting labs thru [place].  A1C improved down to 6.6 with improved sugar readings.  Work on dietary complicance and may be able to cut her Januvia dose.  Declined need for diabetic shoes.  Her  daughter will call to update her eye exam. Her updated medication list for this problem includes:    Lisinopril 10 Mg Tabs (Lisinopril) .Marland Kitchen... 1 tab by mouth daily    Adult Aspirin Low Strength 81 Mg Tbdp (Aspirin) .Marland Kitchen... Take 1 tablet by mouth once a day  Januvia 100 Mg Tabs (Sitagliptin phosphate) .Marland Kitchen... 1 tab by mouth daily    Glimepiride 1 Mg Tabs (Glimepiride) .Marland Kitchen... 1 tab by mouth daily with lunch  Orders: T-Urine Microalbumin w/creat. ratio [phone]) T-Lipid Profile ([id number])  Problem # 2:  HYPERTENSION, BENIGN ESSENTIAL (ICD-401.1) BP looks great.  Volume status looks great with hx of CHF.  Recently seen by [facility].   Her updated medication list for this problem includes:    Lisinopril 10 Mg Tabs (Lisinopril) .Marland Kitchen... 1 tab by mouth daily    Furosemide 40 Mg Tabs (Furosemide) .Marland Kitchen... 1 tab by mouth qam x 7 days then back to 20 mg every other day.  BP today: 125/69 Prior BP: 130/72 (01/07/2009)  Labs Reviewed: K+: 4.6 (12/09/2008) Creat: : 1.12 (12/09/2008)   Chol: 156 (01/18/2008)   HDL: 54 (01/18/2008)   LDL: 75 (01/18/2008)   TG: 134 (01/18/2008)  Problem # 3:  WEAKNESS (ICD-780.79) Order placed for another wheelchair.    Problem # 4:  DEPRESSION (ICD-311) Mood fairly stable on current meds.  She is getting out of her room more and socializing. Her updated medication list for this problem includes:    Bupropion Hcl 200 Mg Xr12h-tab (Bupropion hcl) .Marland Kitchen... Take 1 tab by mouth two times a day    Alprazolam 0.5 Mg Tbdp (Alprazolam) .Marland Kitchen... 1 tab by mouth two times a day as needed anxiety  Complete Medication List: 1)  Nexium 40 Mg Cpdr (Esomeprazole magnesium) .... Take 1 tablet by mouth two times a day 2)  Isosorbide Mononitrate Cr 30 Mg Xr24h-tab (Isosorbide mononitrate) .... Take 1 tablet by mouth once a day 3)  Calcarb 600/d 600-125 Mg-unit Tabs (Calcium-vitamin d) .Marland Kitchen.. 1 tab by mouth bid 4)  Lisinopril 10 Mg Tabs (Lisinopril) .Marland Kitchen.. 1 tab by mouth daily 5)   Nitroglycerin 0.4 Mg Subl (Nitroglycerin) .... Prn 6)  Tylenol 325 Mg Tabs (Acetaminophen) .... 2 tab q6 h prn 7)  Furosemide 40 Mg Tabs (Furosemide) .Marland Kitchen.. 1 tab by mouth qam x 7 days then back to 20 mg every other day. 8)  Miralax Powd (Polyethylene glycol 3350) .Marland KitchenMarland KitchenMarland Kitchen 17 g by mouth daily as needed 9)  Colace 100 Mg Caps (Docusate sodium) .... Two times a day by mouth 10)  Preparation H 0.25-3-14-71.9 % Oint (Phenyleph-shark liv oil-mo-pet) .... Apply three times a day and after bms as needed 11)  Adult Aspirin Low Strength 81 Mg Tbdp (Aspirin) .... Take 1 tablet by mouth once a day 12)  Meclizine Hcl 12.5 Mg Tabs (Meclizine hcl) .... Take 1 tablet by mouth once a day as needed dizziness 13)  Vicodin 5-500 Mg Tabs (Hydrocodone-acetaminophen) .... Take one by mouth every six hours as needed pain 14)  Januvia 100 Mg Tabs (Sitagliptin phosphate) .Marland Kitchen.. 1 tab by mouth daily 15)  Potassium Chloride Cr 10 Meq Cr-tabs (Potassium chloride) .... Take 1 tablet by mouth once a day 16)  Loratadine 10 Mg Tabs (Loratadine) .Marland Kitchen.. 1 tab by mouth daily 17)  Sanctura Xr 60 Mg Xr24h-cap (Trospium chloride) .Marland Kitchen.. 1 capsule by mouth daily 18)  Zofran 8 Mg Tabs (Ondansetron hcl) .Marland Kitchen.. 1 tab by mouth q 8 hrs as needed nausea 19)  Gas-x 80 Mg Chew (Simethicone) .Marland Kitchen.. 1 tab by mouth q 4 hrs as needed gas pain 20)  Premarin 0.625 Mg/gm Crea (Estrogens, conjugated) .... Use pervaginal are 3 x a wk 21)  Zolpidem Tartrate 10 Mg Tabs (Zolpidem tartrate) .... Take 1 tab by mouth at bedtime as needed 22)  Bupropion Hcl  200 Mg Xr12h-tab (Bupropion hcl) .... Take 1 tab by mouth two times a day 23)  Alprazolam 0.5 Mg Tbdp (Alprazolam) .Marland Kitchen.. 1 tab by mouth two times a day as needed anxiety 24)  Fleets Enema  .... Use enema x1; repeat in 12 hrs if no results for a max of 2. 25)  Prune Juice 8 Oz + Benefiber Mixed in  .... Drink daily as directed 26)  Dulcolax 10 Mg Supp (Bisacodyl) .Marland Kitchen.. 1 suppository pr daily as needed for constipation 27)   Glimepiride 1 Mg Tabs (Glimepiride) .Marland Kitchen.. 1 tab by mouth daily with lunch 28)  Manual Wheelchair  .... Use as directed weakness, diabetic neuropathy  Other Orders: T-Comprehensive Metabolic Panel [phone]) T-CBC w/Diff ([id number]) T-TSH [phone])  Patient Instructions: 1)  [facility] to come out for blood/ urine testing. 2)  Will call you w/ results. 3)  Manual wheelchair will be ordered thru [place]. 4)  Sugars/ BPs look great. 5)  Keep up the good work. 6)  Return for f/u in 4 mos. Prescriptions: MANUAL WHEELCHAIR use as directed weakness, diabetic neuropathy  #1 x 0   Entered and Authorized by:   Seymour Bars DO   Signed by:   Seymour Bars DO on 04/07/2009   Method used:   Print then Give to Patient   RxID:   [id number]

## 2010-02-04 NOTE — Progress Notes (Signed)
Summary: Premarin  Phone Note Call from Patient   Caller: Daughter Summary of Call: Daughter states Pt was put on Premarin cream by urology to reduce UTIs. Daughter would like it put back on med list and refilled. Please advise. Initial call taken by: Payton Spark CMA,  September 25, 2009 11:21 AM  Follow-up for Phone Call        pls address with her urologist. Follow-up by: Seymour Bars DO,  September 25, 2009 11:39 AM  Additional Follow-up for Phone Call Additional follow up Details #1::        Pt's daughter aware of the above

## 2010-02-04 NOTE — Assessment & Plan Note (Signed)
Summary: f/u T2DM, constipation, GERD   Vital Signs:  Patient profile:   77 year old female Height:      64.5 inches Weight:      201 pounds BMI:     34.09 O2 Sat:      97 % on Room air Pulse rate:   82 / minute BP sitting:   130 / 72  (left arm) Cuff size:   large  Vitals Entered By: Payton Spark CMA (January 07, 2009 9:57 AM)  O2 Flow:  Room air CC: F/U BP and DM   Primary Care Provider:  Seymour Bars D.O.  CC:  F/U BP and DM.  History of Present Illness: 77 yo WF presents for f/u constipation and dyspepsia.  She had to go to the ED 2 days after the last OV and her Mirallax dose was increased.  She still wakes up nauseated in the AM and has some early satiety.  Her upper GI with small bowel follow thru was negative for diabetic gastroparesis.  She has had GERD for years.  She is on Nexium two times a day.  She is not vomitting.  She is down 8 lbs.  Her sugars are still running 140-160 AM fasting.    Going back to Dr Rhetta Mura the end of this month.    Current Medications (verified): 1)  Nexium 40 Mg Cpdr (Esomeprazole Magnesium) .... Take 1 Tablet By Mouth Two Times A Day 2)  Isosorbide Mononitrate Cr 30 Mg  Xr24h-Tab (Isosorbide Mononitrate) .... Take 1 Tablet By Mouth Once A Day 3)  Calcarb 600/d 600-125 Mg-Unit Tabs (Calcium-Vitamin D) .Marland Kitchen.. 1 Tab By Mouth Bid 4)  Lisinopril 10 Mg Tabs (Lisinopril) .Marland Kitchen.. 1 Tab By Mouth Daily 5)  Nitroglycerin 0.4 Mg Subl (Nitroglycerin) .... Prn 6)  Tylenol 325 Mg Tabs (Acetaminophen) .... 2 Tab Q6 H Prn 7)  Furosemide 40 Mg Tabs (Furosemide) .Marland Kitchen.. 1 Tab By Mouth Qam X 7 Days Then Back To 20 Mg Every Other Day. 8)  Miralax  Powd (Polyethylene Glycol 3350) .Marland KitchenMarland KitchenMarland Kitchen 17 G By Mouth Daily As Needed 9)  Colace 100 Mg Caps (Docusate Sodium) .... Two Times A Day By Mouth 10)  Preparation H 0.25-3-14-71.9 % Oint (Phenyleph-Shark Liv Oil-Mo-Pet) .... Apply Three Times A Day and After Bms As Needed 11)  Adult Aspirin Low Strength 81 Mg  Tbdp (Aspirin) ....  Take 1 Tablet By Mouth Once A Day 12)  Meclizine Hcl 12.5 Mg  Tabs (Meclizine Hcl) .... Take 1 Tablet By Mouth Once A Day As Needed Dizziness 13)  Vicodin 5-500 Mg  Tabs (Hydrocodone-Acetaminophen) .... Take One By Mouth Every Six Hours As Needed Pain 14)  Januvia 100 Mg Tabs (Sitagliptin Phosphate) .Marland Kitchen.. 1 Tab By Mouth Daily 15)  Potassium Chloride Cr 10 Meq Cr-Tabs (Potassium Chloride) .... Take 1 Tablet By Mouth Once A Day 16)  Loratadine 10 Mg Tabs (Loratadine) .Marland Kitchen.. 1 Tab By Mouth Daily 17)  Sanctura Xr 60 Mg Xr24h-Cap (Trospium Chloride) .Marland Kitchen.. 1 Capsule By Mouth Daily 18)  Zofran 8 Mg Tabs (Ondansetron Hcl) .Marland Kitchen.. 1 Tab By Mouth Q 8 Hrs As Needed Nausea 19)  Gas-X 80 Mg Chew (Simethicone) .Marland Kitchen.. 1 Tab By Mouth Q 4 Hrs As Needed Gas Pain 20)  Premarin 0.625 Mg/gm Crea (Estrogens, Conjugated) .... Use Pervaginal Are 3 X A Wk 21)  Zolpidem Tartrate 10 Mg Tabs (Zolpidem Tartrate) .... Take 1 Tab By Mouth At Bedtime As Needed 22)  Bupropion Hcl 200 Mg Xr12h-Tab (Bupropion Hcl) .Marland KitchenMarland KitchenMarland Kitchen  Take 1 Tab By Mouth Two Times A Day 23)  Alprazolam 0.5 Mg Tbdp (Alprazolam) .Marland Kitchen.. 1 Tab By Mouth Two Times A Day As Needed Anxiety 24)  Fleets Enema .... Use Enema X1; Repeat in 12 Hrs If No Results For A Max of 2. 25)  Prune Juice 8 Oz + Benefiber Mixed in .... Drink Daily As Directed 26)  Dulcolax 10 Mg Supp (Bisacodyl) .Marland Kitchen.. 1 Suppository Pr Daily As Needed For Constipation  Allergies (verified): 1)  ! Codeine Sulfate (Codeine Sulfate)  Past History:  Past Medical History: MRSA infection x 2 , 2007 CAD CHF OAB DM type II 2004 osteoarthritis high cholestrol HTN nephrolithiasis lumbar DDD CRF with GFR 30- Stage 3 CKD  [facility] cardiology GI Dr Rhetta Mura  Past Surgical History: Reviewed history from 02/26/2007 and no changes required. pacemaker 2006 appendectomy hysterectomy cholecystectomy back surgery x 2 CEA 4-07 L hip replacment 6-07 kidney stone removal  Lumbar fusion cataract surgery ankle  surgery  Social History: Reviewed history from 12/18/2006 and no changes required. Retired from [employer] Never Smoked 3 grown children Widowed.  Lifes in ALF in [place].  Ambulates w/ walker. Alcohol use-no Regular exercise-no  Review of Systems      See HPI  Physical Exam  General:  alert, well-developed, well-nourished, and well-hydrated.  in wheelchair.  here with daughter Harriett Sine.   Head:  normocephalic and atraumatic.   Eyes:  sclera non icteric Ears:  no external deformities.   Nose:  no nasal discharge.   Mouth:  pharynx pink and moist and fair dentition.   Neck:  no masses.  no JVD Lungs:  faint air movement at the bases; non labored.  No W/R/R Heart:  RRR w/o audible murmur Abdomen:  soft and non-tender.   Extremities:  1+ pitting ankle edema bilat Skin:  color normal.   Psych:  memory intact for recent and remote, good eye contact, not anxious appearing, and not depressed appearing.     Impression & Recommendations:  Problem # 1:  CONSTIPATION, CHRONIC (ICD-564.09) Improved with increased dose of daily Miralax.  She is feeling better but still having AM dyspepsia and nausea.  Her updated medication list for this problem includes:    Miralax Powd (Polyethylene glycol 3350) .Marland KitchenMarland KitchenMarland KitchenMarland Kitchen 17 g by mouth daily as needed    Colace 100 Mg Caps (Docusate sodium) .Marland Kitchen..Marland Kitchen Two times a day by mouth    Dulcolax 10 Mg Supp (Bisacodyl) .Marland Kitchen... 1 suppository pr daily as needed for constipation  Problem # 2:  GERD (ICD-530.81) Assessment: Unchanged Failed to improve with treatment of chronic constipation.  Continue two times a day Nexium.  D/W daughter the option to do a CT abd/ pelvis to look for other etiology, malignancy, etc but given the fact that she has a stable exam with no sign of acute abdomen and that her dyspepsia and nausea has been ongoing for years, she declined this today.  Her Upper GI was neg for gastroparesis.  She has f/u with Dr Rhetta Mura in 2-3 wks and will discuss possible EGD  need. Her updated medication list for this problem includes:    Nexium 40 Mg Cpdr (Esomeprazole magnesium) .Marland Kitchen... Take 1 tablet by mouth two times a day  Problem # 3:  HYPERTENSION, BENIGN ESSENTIAL (ICD-401.1) At goal.  Continue current meds.  Labs due next visit. Her updated medication list for this problem includes:    Lisinopril 10 Mg Tabs (Lisinopril) .Marland Kitchen... 1 tab by mouth daily    Furosemide 40 Mg Tabs (  Furosemide) .Marland Kitchen... 1 tab by mouth qam x 7 days then back to 20 mg every other day.  BP today: 130/72 Prior BP: 158/86 (12/09/2008)  Labs Reviewed: K+: 4.6 (12/09/2008) Creat: : 1.12 (12/09/2008)   Chol: 156 (01/18/2008)   HDL: 54 (01/18/2008)   LDL: 75 (01/18/2008)   TG: 134 (01/18/2008)  Problem # 4:  DM, UNCOMPLICATED, TYPE II (ICD-250.00) A1C 7.5 from 7.3 with elevated AM fastings.  I changed her MR to read Diabetic diet and added Glimeperide 1 mg daily with lunch to help control sugar readings.  Monofilament and Umicro due next visit with labs. Her updated medication list for this problem includes:    Lisinopril 10 Mg Tabs (Lisinopril) .Marland Kitchen... 1 tab by mouth daily    Adult Aspirin Low Strength 81 Mg Tbdp (Aspirin) .Marland Kitchen... Take 1 tablet by mouth once a day    Januvia 100 Mg Tabs (Sitagliptin phosphate) .Marland Kitchen... 1 tab by mouth daily    Glimepiride 1 Mg Tabs (Glimepiride) .Marland Kitchen... 1 tab by mouth daily with lunch  Orders: Fingerstick (36416) Hemoglobin A1C (83036)  Labs Reviewed: Creat: 1.12 (12/09/2008)     Last Eye Exam: done by Dr Nile Riggs, no retinopathy (02/13/2006) Reviewed HgBA1c results: 7.5 (01/07/2009)  7.3 (09/11/2008)  Complete Medication List: 1)  Nexium 40 Mg Cpdr (Esomeprazole magnesium) .... Take 1 tablet by mouth two times a day 2)  Isosorbide Mononitrate Cr 30 Mg Xr24h-tab (Isosorbide mononitrate) .... Take 1 tablet by mouth once a day 3)  Calcarb 600/d 600-125 Mg-unit Tabs (Calcium-vitamin d) .Marland Kitchen.. 1 tab by mouth bid 4)  Lisinopril 10 Mg Tabs (Lisinopril) .Marland Kitchen.. 1 tab  by mouth daily 5)  Nitroglycerin 0.4 Mg Subl (Nitroglycerin) .... Prn 6)  Tylenol 325 Mg Tabs (Acetaminophen) .... 2 tab q6 h prn 7)  Furosemide 40 Mg Tabs (Furosemide) .Marland Kitchen.. 1 tab by mouth qam x 7 days then back to 20 mg every other day. 8)  Miralax Powd (Polyethylene glycol 3350) .Marland KitchenMarland KitchenMarland Kitchen 17 g by mouth daily as needed 9)  Colace 100 Mg Caps (Docusate sodium) .... Two times a day by mouth 10)  Preparation H 0.25-3-14-71.9 % Oint (Phenyleph-shark liv oil-mo-pet) .... Apply three times a day and after bms as needed 11)  Adult Aspirin Low Strength 81 Mg Tbdp (Aspirin) .... Take 1 tablet by mouth once a day 12)  Meclizine Hcl 12.5 Mg Tabs (Meclizine hcl) .... Take 1 tablet by mouth once a day as needed dizziness 13)  Vicodin 5-500 Mg Tabs (Hydrocodone-acetaminophen) .... Take one by mouth every six hours as needed pain 14)  Januvia 100 Mg Tabs (Sitagliptin phosphate) .Marland Kitchen.. 1 tab by mouth daily 15)  Potassium Chloride Cr 10 Meq Cr-tabs (Potassium chloride) .... Take 1 tablet by mouth once a day 16)  Loratadine 10 Mg Tabs (Loratadine) .Marland Kitchen.. 1 tab by mouth daily 17)  Sanctura Xr 60 Mg Xr24h-cap (Trospium chloride) .Marland Kitchen.. 1 capsule by mouth daily 18)  Zofran 8 Mg Tabs (Ondansetron hcl) .Marland Kitchen.. 1 tab by mouth q 8 hrs as needed nausea 19)  Gas-x 80 Mg Chew (Simethicone) .Marland Kitchen.. 1 tab by mouth q 4 hrs as needed gas pain 20)  Premarin 0.625 Mg/gm Crea (Estrogens, conjugated) .... Use pervaginal are 3 x a wk 21)  Zolpidem Tartrate 10 Mg Tabs (Zolpidem tartrate) .... Take 1 tab by mouth at bedtime as needed 22)  Bupropion Hcl 200 Mg Xr12h-tab (Bupropion hcl) .... Take 1 tab by mouth two times a day 23)  Alprazolam 0.5 Mg Tbdp (Alprazolam) .Marland KitchenMarland KitchenMarland Kitchen 1  tab by mouth two times a day as needed anxiety 24)  Fleets Enema  .... Use enema x1; repeat in 12 hrs if no results for a max of 2. 25)  Prune Juice 8 Oz + Benefiber Mixed in  .... Drink daily as directed 26)  Dulcolax 10 Mg Supp (Bisacodyl) .Marland Kitchen.. 1 suppository pr daily as needed  for constipation 27)  Glimepiride 1 Mg Tabs (Glimepiride) .Marland Kitchen.. 1 tab by mouth daily with lunch Prescriptions: GLIMEPIRIDE 1 MG TABS (GLIMEPIRIDE) 1 tab by mouth daily with lunch  #30 x 3   Entered and Authorized by:   Seymour Bars DO   Signed by:   Seymour Bars DO on 01/07/2009   Method used:   Print then Give to Patient   RxID:   [id number]   Laboratory Results   Blood Tests     HGBA1C: 7.5%   (Normal Range: Non-Diabetic - 3-6%   Control Diabetic - 6-8%)

## 2010-02-04 NOTE — Miscellaneous (Signed)
Summary: Care Plan/[facility]  Care Plan/[facility]   Imported By: Lanelle Bal 08/20/2009 12:16:10  _____________________________________________________________________  External Attachment:    Type:   Image     Comment:   External Document

## 2010-02-04 NOTE — Progress Notes (Signed)
Summary: UA and culture  Phone Note From Other Clinic   Caller: Patient Caller: [facility]  Summary of Call: [facility] needs order for UA and culture. Pt is c/o burning w/ urination. Initial call taken by: Payton Spark CMA,  February 10, 2009 10:59 AM  Follow-up for Phone Call        printed order.  pls fax. Follow-up by: Seymour Bars DO,  February 10, 2009 11:02 AM  New Problems: DYSURIA (ICD-788.1)   New Problems: DYSURIA (ICD-788.1)  Appended Document: UA and culture order faxed to [phone]

## 2010-02-04 NOTE — Progress Notes (Signed)
Summary: Vicodin Rx  Phone Note Call from Patient   Caller: Patient Summary of Call: [facility] called [facility] stating they need a Rx faxed to them for Vicodin. Please advise. Fax # [phone] Initial call taken by: Payton Spark CMA,  January 20, 2009 12:36 PM  Follow-up for Phone Call        see RF note from today in EMR. Follow-up by: Seymour Bars DO,  January 20, 2009 12:39 PM     Appended Document: Vicodin Rx Note faxed

## 2010-02-04 NOTE — Progress Notes (Signed)
Summary: UA normal  Phone Note Outgoing Call Call back at [phone]   Summary of Call: Pls call [facility] and let them know that her UA was NORMAL.  Seymour Bars, D.O.  Initial call taken by: Seymour Bars DO,  April 02, 2009 12:57 PM     Appended Document: UA normal LMOM informing daughter of the above

## 2010-02-04 NOTE — Miscellaneous (Signed)
Summary: Care Plan/[facility]  Care Plan/[facility]   Imported By: Lanelle Bal 03/05/2009 10:26:52  _____________________________________________________________________  External Attachment:    Type:   Image     Comment:   External Document

## 2010-02-04 NOTE — Miscellaneous (Signed)
Summary: SN Order & Care Plan/[facility]  SN Order & Care Plan/[facility]   Imported By: Lanelle Bal 04/23/2009 12:59:48  _____________________________________________________________________  External Attachment:    Type:   Image     Comment:   External Document

## 2010-02-04 NOTE — Miscellaneous (Signed)
Summary: PT Order & Recert/[facility]  PT Order & Recert/[facility]   Imported By: Lanelle Bal 07/21/2009 14:42:14  _____________________________________________________________________  External Attachment:    Type:   Image     Comment:   External Document

## 2010-02-04 NOTE — Miscellaneous (Signed)
Summary: Recert/[facility]  Recert/[facility]   Imported By: Lanelle Bal 11/14/2009 11:25:34  _____________________________________________________________________  External Attachment:    Type:   Image     Comment:   External Document

## 2010-02-04 NOTE — Letter (Signed)
Summary: [facility]  [facility]   Imported By: Lanelle Bal 04/09/2009 11:13:44  _____________________________________________________________________  External Attachment:    Type:   Image     Comment:   External Document

## 2010-02-04 NOTE — Progress Notes (Signed)
Summary: Urine  Phone Note From Other Clinic   Caller: Man[facility] Summary of Call: [facility] called stating they collected UA and sent out. Lab called back bc they were unable to read UA. They requested new so Manelle collected urine this AM but called here bc urine is greenish in color and has pus in it. I advised Manelle to have Pt go to ED. Manelle agreed and will arrange for daughter to take Pt to ED for eval and treat.  Initial call taken by: Payton Spark CMA,  February 12, 2009 9:09 AM     Appended Document: Urine I called [facility] to confirm that Pt was sent to ED. Spoke w/ Manelle and Pt was sent via EMS to [facility].

## 2010-02-04 NOTE — Medication Information (Signed)
Summary: Fax Regarding Armed forces technical officer Regarding Macrodantin/[facility]   Imported By: Lanelle Bal 04/20/2009 08:29:08  _____________________________________________________________________  External Attachment:    Type:   Image     Comment:   External Document

## 2010-02-04 NOTE — Miscellaneous (Signed)
Summary: Care Plan/[facility]   Imported By: Lanelle Bal 10/02/2009 10:42:22  _____________________________________________________________________  External Attachment:    Type:   Image     Comment:   External Document

## 2010-02-04 NOTE — Progress Notes (Signed)
Summary: Need order to d/c Avelox  Phone Note From Other Clinic   Caller: [facility] Summary of Call: [facility] needs order to D/C Avelox.  Fax to [phone] Initial call taken by: Payton Spark CMA,  April 22, 2009 10:40 AM    New/Updated Medications: * D/C AVELOX D/C AVELOX Prescriptions: D/C AVELOX D/C AVELOX  #1 x 0   Entered and Authorized by:   Seymour Bars DO   Signed by:   Seymour Bars DO on 04/22/2009   Method used:   Print then Give to Patient   RxID:   [id number]   Appended Document: Need order to d/c Avelox faxed

## 2010-02-04 NOTE — Letter (Signed)
Summary: CMN for Back Cushion/[facility]  CMN for Back Cushion/[facility]   Imported By: Lanelle Bal 08/20/2009 12:15:24  _____________________________________________________________________  External Attachment:    Type:   Image     Comment:   External Document

## 2010-02-04 NOTE — Progress Notes (Signed)
Summary: ABX  Phone Note Call from Patient   Caller: Patient Summary of Call: [facility] stating [facility] does not allow samples meds to be given/taken. Pt unable to afford Rx. Please advise. Initial call taken by: Payton Spark CMA,  April 21, 2009 12:39 PM  Follow-up for Phone Call        Will change her abx to doxycycline 100 mg by mouth two times a day x 10 days #20.  New RX put into EMR.  Pls send to [facility].  Thanks.  This repaces the Avelox.  Have her f/u with me on MOnday.   Follow-up by: Seymour Bars DO,  April 21, 2009 2:58 PM    New/Updated Medications: DOXYCYCLINE HYCLATE 100 MG CAPS (DOXYCYCLINE HYCLATE) 1 tab by mouth two times a day x 10 days Prescriptions: DOXYCYCLINE HYCLATE 100 MG CAPS (DOXYCYCLINE HYCLATE) 1 tab by mouth two times a day x 10 days  #20 x 0   Entered and Authorized by:   Seymour Bars DO   Signed by:   Seymour Bars DO on 04/21/2009   Method used:   Printed then faxed to ...         RxID:   [id number]

## 2010-02-04 NOTE — Progress Notes (Signed)
Summary: ? Bronchitis  Phone Note Call from Patient   Caller: Patient Summary of Call: Daughter calls stating Pt has started w/ productive cough again. Daughter thinks it's bronchitis/URI. Daughter would like to know if you will send Rx for Pt to take w/ her mucinex. Daughter states it's too hot for Pt to come out for OV.  Initial call taken by: Payton Spark CMA,  June 03, 2009 2:41 PM  Follow-up for Phone Call        Antibiotics are not indicated for viral bronchitis.  She can take Mucinex DM and if having SOB, we can order a CXR tomorrow. Follow-up by: Seymour Bars DO,  June 03, 2009 2:46 PM  Additional Follow-up for Phone Call Additional follow up Details #1::        Pt's daughter aware  Additional Follow-up by: Payton Spark CMA,  June 03, 2009 2:49 PM

## 2010-02-04 NOTE — Assessment & Plan Note (Signed)
Summary: f/u diabetes/ abscess   Vital Signs:  Patient profile:   77 year old female Height:      64.5 inches Weight:      195 pounds BMI:     33.07 O2 Sat:      96 % on Room air Temp:     98.5 degrees F oral Pulse rate:   60 / minute BP sitting:   147 / 72  (left arm) Cuff size:   large  Vitals Entered By: Payton Spark CMA (August 11, 2009 10:57 AM)  O2 Flow:  Room air CC: 4 mo f/u. ? infected hair follicle in pubic area x 5 days.   Primary Care Provider:  Seymour Bars D.O.  CC:  4 mo f/u. ? infected hair follicle in pubic area x 5 days.Marland Kitchen  History of Present Illness: 77 yo WF presents for f/u T2DM.  She is doing well, though [employer] has stopped checking her fasting sugars.  She reports drinking only Ensure the past wk after having a dental extraction.  Also a wk ago, she developed a sore under her panus that is draining.  Denies fevers or chills.    She is taking all of her meds w/o problems.  She has stage II CKD and is due for labs today.   She was started on Metoprolol 25 mg last visit for elevated BPs.  She has a pacemaker.  Denies CP or DOE.  She is ambulating more w/ her walker since doing PT.  She has f/u with Dr Rhetta Mura in Sept for her reflux which has improved on Nexium.  Current Medications (verified): 1)  Nexium 40 Mg Cpdr (Esomeprazole Magnesium) .... Take 1 Tablet By Mouth Two Times A Day 2)  Isosorbide Mononitrate Cr 30 Mg  Xr24h-Tab (Isosorbide Mononitrate) .... Take 1 Tablet By Mouth Once A Day 3)  Calcarb 600/d 600-125 Mg-Unit Tabs (Calcium-Vitamin D) .Marland Kitchen.. 1 Tab By Mouth Bid 4)  Nitroglycerin 0.4 Mg Subl (Nitroglycerin) .... Prn 5)  Tylenol 325 Mg Tabs (Acetaminophen) .... 2 Tab Q6 H Prn 6)  Furosemide 40 Mg Tabs (Furosemide) .Marland Kitchen.. 1 Tab By Mouth Qam X 7 Days Then Back To 20 Mg Every Other Day. 7)  Miralax  Powd (Polyethylene Glycol 3350) .Marland KitchenMarland KitchenMarland Kitchen 17 G By Mouth Daily As Needed 8)  Colace 100 Mg Caps (Docusate Sodium) .... Two Times A Day By Mouth 9)   Preparation H 0.25-3-14-71.9 % Oint (Phenyleph-Shark Liv Oil-Mo-Pet) .... Apply Three Times A Day and After Bms As Needed 10)  Adult Aspirin Low Strength 81 Mg  Tbdp (Aspirin) .... Take 1 Tablet By Mouth Once A Day 11)  Meclizine Hcl 12.5 Mg  Tabs (Meclizine Hcl) .... Take 1 Tablet By Mouth Once A Day As Needed Dizziness 12)  Vicodin 5-500 Mg  Tabs (Hydrocodone-Acetaminophen) .... Take One By Mouth Every Six Hours As Needed Pain 13)  Januvia 100 Mg Tabs (Sitagliptin Phosphate) .Marland Kitchen.. 1 Tab By Mouth Daily 14)  Potassium Chloride Cr 10 Meq Cr-Tabs (Potassium Chloride) .... Take 1 Tablet By Mouth Once A Day 15)  Loratadine 10 Mg Tabs (Loratadine) .Marland Kitchen.. 1 Tab By Mouth Daily 16)  Sanctura Xr 60 Mg Xr24h-Cap (Trospium Chloride) .Marland Kitchen.. 1 Capsule By Mouth Daily 17)  Zofran 8 Mg Tabs (Ondansetron Hcl) .Marland Kitchen.. 1 Tab By Mouth Q 8 Hrs As Needed Nausea 18)  Gas-X 80 Mg Chew (Simethicone) .Marland Kitchen.. 1 Tab By Mouth Q 4 Hrs As Needed Gas Pain 19)  Premarin 0.625 Mg/gm Crea (Estrogens,  Conjugated) .... Use Pervaginal Are 3 X A Wk 20)  Zolpidem Tartrate 10 Mg Tabs (Zolpidem Tartrate) .... Take 1 Tab By Mouth At Bedtime As Needed 21)  Bupropion Hcl 200 Mg Xr12h-Tab (Bupropion Hcl) .... Take 1 Tab By Mouth Two Times A Day 22)  Clonazepam 0.5 Mg Tabs (Clonazepam) .Marland Kitchen.. 1 Tab By Mouth Two Times A Day As Needed Anxiety 23)  Fleets Enema .... Use Enema X1; Repeat in 12 Hrs If No Results For A Max of 2. 24)  Prune Juice 8 Oz + Benefiber Mixed in .... Drink Daily As Directed 25)  Dulcolax 10 Mg Supp (Bisacodyl) .Marland Kitchen.. 1 Suppository Pr Daily As Needed For Constipation 26)  Glimepiride 1 Mg Tabs (Glimepiride) .Marland Kitchen.. 1 Tab By Mouth Daily With Lunch 27)  Manual Wheelchair .... Use As Directed Weakness, Diabetic Neuropathy 28)  Pravastatin Sodium 40 Mg Tabs (Pravastatin Sodium) .Marland Kitchen.. 1 Tab By Mouth Qhs 29)  Metoprolol Succinate 25 Mg Xr24h-Tab (Metoprolol Succinate) .Marland Kitchen.. 1 Tab By Mouth Once A Day 30)  Doxepin Hcl 10 Mg Caps (Doxepin Hcl) .Marland Kitchen.. 1  Capsule By Mouth Qhs 31)  D/c Clonazepam 32)  Alprazolam 0.5 Mg Tabs (Alprazolam) .Marland Kitchen.. 1 Tab By Mouth Two Times A Day As Needed Anxiety  Allergies (verified): 1)  ! Codeine Sulfate (Codeine Sulfate)  Past History:  Past Medical History: Reviewed history from 01/07/2009 and no changes required. MRSA infection x 2 , 2007 CAD CHF OAB DM type II 2004 osteoarthritis high cholestrol HTN nephrolithiasis lumbar DDD CRF with GFR 30- Stage 3 CKD  [facility] cardiology GI Dr Rhetta Mura  Past Surgical History: Reviewed history from 02/26/2007 and no changes required. pacemaker 2006 appendectomy hysterectomy cholecystectomy back surgery x 2 CEA 4-07 L hip replacment 6-07 kidney stone removal  Lumbar fusion cataract surgery ankle surgery  Social History: Reviewed history from 12/18/2006 and no changes required. Retired from [employer] Never Smoked 3 grown children Widowed.  Lifes in ALF in [place].  Ambulates w/ walker. Alcohol use-no Regular exercise-no  Review of Systems      See HPI  Physical Exam  General:  alert, well-developed, well-nourished, and well-hydrated.  here with daughter Harriett Sine using a wheelchair Head:  normocephalic, atraumatic, and focal alopecia.   Eyes:  sclera non icteric Mouth:  pharynx pink and moist, fair dentition, and teeth missing.   Neck:  no masses.  no JVD Lungs:  normal respiratory effort, no intercostal retractions, no accessory muscle use, no dullness, no crackles, and no wheezes.   Heart:  normal rate, regular rhythm, and no murmur.   Abdomen:  soft.   Pulses:  2+ radial pulses Extremities:  no LE edema Skin:  color normal.   open abscess 1.2 cm under central panus fold draining serosanginous fluid Psych:  memory intact for recent and remote, good eye contact, not anxious appearing, and not depressed appearing.     Impression & Recommendations:  Problem # 1:  DM, UNCOMPLICATED, TYPE II (ICD-250.00) A1C improved to 6.4.  Will cut back on her  Januvia from 100 mg --> 50 mg due to stage II CKD.  Continue Glimeperide. Recheck in 3 months.  Flu shot this Fall.   Her updated medication list for this problem includes:    Adult Aspirin Low Strength 81 Mg Tbdp (Aspirin) .Marland Kitchen... Take 1 tablet by mouth once a day    Januvia 50 Mg Tabs (Sitagliptin phosphate) .Marland Kitchen... 1 tab by mouth daily    Glimepiride 1 Mg Tabs (Glimepiride) .Marland Kitchen... 1 tab by mouth daily  with lunch  Orders: Fingerstick (36416) Hgb A1C (04540JW)  Labs Reviewed: Creat: 1.12 (12/09/2008)     Last Eye Exam: no retinopathy ( Dr Nile Riggs) (04/13/2009) Reviewed HgBA1c results: 7.5 (01/07/2009)  7.3 (09/11/2008)  Problem # 2:  HYPERTENSION, BENIGN ESSENTIAL (ICD-401.1) BP has come down some but is still high.  Will go up on her metoprolol succinate from 25--> 50 Her updated medication list for this problem includes:    Furosemide 40 Mg Tabs (Furosemide) .Marland Kitchen... 1 tab by mouth qam x 7 days then back to 20 mg every other day.    Metoprolol Succinate 50 Mg Xr24h-tab (Metoprolol succinate) .Marland Kitchen... 1 tab by mouth daily  BP today: 147/72 Prior BP: 154/74 (05/20/2009)  Labs Reviewed: K+: 4.6 (12/09/2008) Creat: : 1.12 (12/09/2008)   Chol: 156 (01/18/2008)   HDL: 54 (01/18/2008)   LDL: 75 (01/18/2008)   TG: 134 (01/18/2008)  Problem # 3:  GERD (ICD-530.81) Assessment: Improved Has f/u with Dr Rhetta Mura next month. Her updated medication list for this problem includes:    Nexium 40 Mg Cpdr (Esomeprazole magnesium) .Marland Kitchen... Take 1 tablet by mouth two times a day  Problem # 4:  CELLULITIS AND ABSCESS OF TRUNK (ICD-682.2) Small open abscess under the panus.  Treat with 10 days fo Minocycline and keep site clean and covered with polysporin ointment and gauze each day.   Her updated medication list for this problem includes:    Minocycline Hcl 100 Mg Caps (Minocycline hcl) .Marland Kitchen... 1 capsule by mouth two times a day x 10 days  Problem # 5:  CHRONIC KIDNEY DISEASE UNSPECIFIED  (ICD-585.9)  Orders: T-Comprehensive Metabolic Panel ([id number])  Complete Medication List: 1)  Nexium 40 Mg Cpdr (Esomeprazole magnesium) .... Take 1 tablet by mouth two times a day 2)  Isosorbide Mononitrate Cr 30 Mg Xr24h-tab (Isosorbide mononitrate) .... Take 1 tablet by mouth once a day 3)  Calcarb 600/d 600-125 Mg-unit Tabs (Calcium-vitamin d) .Marland Kitchen.. 1 tab by mouth bid 4)  Nitroglycerin 0.4 Mg Subl (Nitroglycerin) .... Prn 5)  Tylenol 325 Mg Tabs (Acetaminophen) .... 2 tab q6 h prn 6)  Furosemide 40 Mg Tabs (Furosemide) .Marland Kitchen.. 1 tab by mouth qam x 7 days then back to 20 mg every other day. 7)  Miralax Powd (Polyethylene glycol 3350) .Marland KitchenMarland KitchenMarland Kitchen 17 g by mouth daily as needed 8)  Colace 100 Mg Caps (Docusate sodium) .... Two times a day by mouth 9)  Preparation H 0.25-3-14-71.9 % Oint (Phenyleph-shark liv oil-mo-pet) .... Apply three times a day and after bms as needed 10)  Adult Aspirin Low Strength 81 Mg Tbdp (Aspirin) .... Take 1 tablet by mouth once a day 11)  Meclizine Hcl 12.5 Mg Tabs (Meclizine hcl) .... Take 1 tablet by mouth once a day as needed dizziness 12)  Vicodin 5-500 Mg Tabs (Hydrocodone-acetaminophen) .... Take one by mouth every six hours as needed pain 13)  Januvia 50 Mg Tabs (Sitagliptin phosphate) .Marland Kitchen.. 1 tab by mouth daily 14)  Potassium Chloride Cr 10 Meq Cr-tabs (Potassium chloride) .... Take 1 tablet by mouth once a day 15)  Loratadine 10 Mg Tabs (Loratadine) .Marland Kitchen.. 1 tab by mouth daily 16)  Sanctura Xr 60 Mg Xr24h-cap (Trospium chloride) .Marland Kitchen.. 1 capsule by mouth daily 17)  Zofran 8 Mg Tabs (Ondansetron hcl) .Marland Kitchen.. 1 tab by mouth q 8 hrs as needed nausea 18)  Gas-x 80 Mg Chew (Simethicone) .Marland Kitchen.. 1 tab by mouth q 4 hrs as needed gas pain 19)  Premarin 0.625 Mg/gm Crea (Estrogens, conjugated) .Marland KitchenMarland KitchenMarland Kitchen  Use pervaginal are 3 x a wk 20)  Zolpidem Tartrate 10 Mg Tabs (Zolpidem tartrate) .... Take 1 tab by mouth at bedtime as needed 21)  Bupropion Hcl 200 Mg Xr12h-tab (Bupropion hcl) ....  Take 1 tab by mouth two times a day 22)  Clonazepam 0.5 Mg Tabs (Clonazepam) .Marland Kitchen.. 1 tab by mouth two times a day as needed anxiety 23)  Fleets Enema  .... Use enema x1; repeat in 12 hrs if no results for a max of 2. 24)  Prune Juice 8 Oz + Benefiber Mixed in  .... Drink daily as directed 25)  Dulcolax 10 Mg Supp (Bisacodyl) .Marland Kitchen.. 1 suppository pr daily as needed for constipation 26)  Glimepiride 1 Mg Tabs (Glimepiride) .Marland Kitchen.. 1 tab by mouth daily with lunch 27)  Manual Wheelchair  .... Use as directed weakness, diabetic neuropathy 28)  Pravastatin Sodium 40 Mg Tabs (Pravastatin sodium) .Marland Kitchen.. 1 tab by mouth qhs 29)  Metoprolol Succinate 50 Mg Xr24h-tab (Metoprolol succinate) .Marland Kitchen.. 1 tab by mouth daily 30)  Doxepin Hcl 10 Mg Caps (Doxepin hcl) .Marland Kitchen.. 1 capsule by mouth qhs 31)  D/c Clonazepam  32)  Alprazolam 0.5 Mg Tabs (Alprazolam) .Marland Kitchen.. 1 tab by mouth two times a day as needed anxiety 33)  Minocycline Hcl 100 Mg Caps (Minocycline hcl) .Marland Kitchen.. 1 capsule by mouth two times a day x 10 days  Other Orders: T-Lipid Profile ([id number])  Patient Instructions: 1)  Update fasting labs downstairs today. 2)  Will call you w/ results tomorrow. 3)  Changes: 4)  Decreasing Januvia from 100 mg --> 50 mg for diabetes (A1C is < 7). 5)  Increasing Metoprolol from 25 mg --> 50 mg/ day for elevated BP.  Will watch for slow pulse rate. 6)  Treat abscess with 10 days of Minocycline + wound care.  Call if not improving. 7)  REturn for f/u diabetes in 3 mos. Prescriptions: MINOCYCLINE HCL 100 MG CAPS (MINOCYCLINE HCL) 1 capsule by mouth two times a day x 10 days  #20 x 0   Entered and Authorized by:   Seymour Bars DO   Signed by:   Seymour Bars DO on 08/11/2009   Method used:   Print then Give to Patient   RxID:   [id number] JANUVIA 50 MG TABS (SITAGLIPTIN PHOSPHATE) 1 tab by mouth daily  #30 x 5   Entered and Authorized by:   Seymour Bars DO   Signed by:   Seymour Bars DO on 08/11/2009   Method used:   Print  then Give to Patient   RxID:   [id number] METOPROLOL SUCCINATE 50 MG XR24H-TAB (METOPROLOL SUCCINATE) 1 tab by mouth daily  #30 x 5   Entered and Authorized by:   Seymour Bars DO   Signed by:   Seymour Bars DO on 08/11/2009   Method used:   Print then Give to Patient   RxID:   [id number]   Appended Document: f/u diabetes/ abscess  Laboratory Results   Blood Tests     HGBA1C: 6.4%   (Normal Range: Non-Diabetic - 3-6%   Control Diabetic - 6-8%) CBG Fasting:: 129mg /dL

## 2010-02-04 NOTE — Miscellaneous (Signed)
Summary: [facility] Order/[facility]  [facility] Order/[facility]   Imported By: Lanelle Bal 05/20/2009 14:00:27  _____________________________________________________________________  External Attachment:    Type:   Image     Comment:   External Document

## 2010-02-04 NOTE — Miscellaneous (Signed)
Summary: Personal Service Plan/[facility]  Personal Service Plan/[facility]   Imported By: Maryln Gottron 12/11/2009 13:12:42  _____________________________________________________________________  External Attachment:    Type:   Image     Comment:   External Document

## 2010-02-05 ENCOUNTER — Encounter: Payer: Self-pay | Admitting: Family Medicine

## 2010-03-02 NOTE — Miscellaneous (Signed)
Summary: [facility]  [facility]   Imported By: Lanelle Bal 02/25/2010 11:19:10  _____________________________________________________________________  External Attachment:    Type:   Image     Comment:   External Document

## 2010-03-03 ENCOUNTER — Encounter: Payer: Self-pay | Admitting: Family Medicine

## 2010-03-05 ENCOUNTER — Encounter: Payer: Self-pay | Admitting: Family Medicine

## 2010-03-11 NOTE — Miscellaneous (Signed)
Summary: A1C 7.3  Clinical Lists Changes  Observations: Added new observation of HGBA1C: 7.3 % (03/03/2010 8:24)  Appended Document: A1C 7.3 Pls let pt/ daughter, Harriett Sine know that Johni's A1C looks good at 7.3== good control of diabetes.  Continue current meds.  Seymour Bars, D.O.  Appended Document: A1C 7.3 Daughter aware of the above

## 2010-03-15 ENCOUNTER — Ambulatory Visit (INDEPENDENT_AMBULATORY_CARE_PROVIDER_SITE_OTHER): Payer: Medicare Other | Admitting: Family Medicine

## 2010-03-15 ENCOUNTER — Encounter: Payer: Self-pay | Admitting: Family Medicine

## 2010-03-15 DIAGNOSIS — H5710 Ocular pain, unspecified eye: Secondary | ICD-10-CM | POA: Insufficient documentation

## 2010-03-16 LAB — GLUCOSE, CAPILLARY: Glucose-Capillary: 132 mg/dL — ABNORMAL HIGH (ref 70–99)

## 2010-03-23 LAB — BASIC METABOLIC PANEL
CO2: 29 mEq/L (ref 19–32)
Calcium: 8.6 mg/dL (ref 8.4–10.5)
Chloride: 103 mEq/L (ref 96–112)
GFR calc non Af Amer: 42 mL/min — ABNORMAL LOW (ref >60)
GFR calc non Af Amer: 51 mL/min — ABNORMAL LOW (ref >60)
Glucose, Bld: 106 mg/dL — ABNORMAL HIGH (ref 70–99)
Glucose, Bld: 124 mg/dL — ABNORMAL HIGH (ref 70–99)
Potassium: 4.5 mEq/L (ref 3.5–5.1)
Sodium: 136 mEq/L (ref 135–145)
Sodium: 137 mEq/L (ref 135–145)

## 2010-03-23 LAB — CBC
HCT: 33.9 % — ABNORMAL LOW (ref 36.0–46.0)
Hemoglobin: 10.8 g/dL — ABNORMAL LOW (ref 12.0–15.0)
Hemoglobin: 11.5 g/dL — ABNORMAL LOW (ref 12.0–15.0)
MCHC: 33.9 g/dL (ref 30.0–36.0)
MCV: 94.3 fL (ref 78.0–100.0)
Platelets: 234 10*3/uL (ref 150–400)
Platelets: 314 10*3/uL (ref 150–400)
RDW: 13 % (ref 11.5–15.5)
RDW: 13.2 % (ref 11.5–15.5)
RDW: 13.4 % (ref 11.5–15.5)

## 2010-03-23 LAB — GLUCOSE, CAPILLARY
Glucose-Capillary: 112 mg/dL — ABNORMAL HIGH (ref 70–99)
Glucose-Capillary: 169 mg/dL — ABNORMAL HIGH (ref 70–99)
Glucose-Capillary: 83 mg/dL (ref 70–99)

## 2010-03-23 LAB — CULTURE, BLOOD (ROUTINE X 2)

## 2010-03-23 LAB — COMPREHENSIVE METABOLIC PANEL
ALT: 15 U/L (ref 0–35)
AST: 26 U/L (ref 0–37)
Albumin: 3.5 g/dL (ref 3.5–5.2)
Alkaline Phosphatase: 82 U/L (ref 39–117)
Potassium: 5.7 mEq/L — ABNORMAL HIGH (ref 3.5–5.1)
Sodium: 134 mEq/L — ABNORMAL LOW (ref 135–145)
Total Protein: 6.7 g/dL (ref 6.0–8.3)

## 2010-03-23 NOTE — Assessment & Plan Note (Signed)
Summary: eye pain/ red   Vital Signs:  Patient profile:   77 year old female Height:      64.5 inches Weight:      201 pounds BMI:     34.09 O2 Sat:      97 % on Room air Pulse rate:   62 / minute BP sitting:   165 / 75  (left arm) Cuff size:   large  Vitals Entered By: Payton Spark CMA (March 15, 2010 2:44 PM)  O2 Flow:  Room air CC: R eye red x 3 days. No known injury   Primary Care Provider:  Seymour Bars D.O.  CC:  R eye red x 3 days. No known injury.  History of Present Illness: 77 year old female presents with a red right eye.  Three days ago, she noticed that her right eye was very red when looking in the mirror.  She also had a dull, achy pain as well as pain when looking in the light.  She has been more dizzy than normal and is afraid she may fall.  She denies any burning in her eyes, watery or purulent discharge.  She states this has never happened before.  She denies any changes in her vision or feeling like her eyes are glued shut upon waking in the morning.  She denies any blackouts, whiteouts or foggy patches in the center of her vision.  She has not tried any medications as she is in assisted living.  She has had sick contacts with pink eye.  She denies any trauma.  Allergies: 1)  ! Codeine Sulfate (Codeine Sulfate)  Past History:  Past Medical History: Reviewed history from 01/07/2009 and no changes required. MRSA infection x 2 , 2007 CAD CHF OAB DM type II 2004 osteoarthritis high cholestrol HTN nephrolithiasis lumbar DDD CRF with GFR 30- Stage 3 CKD  [facility] cardiology GI Dr Rhetta Mura  Past Surgical History: Reviewed history from 02/26/2007 and no changes required. pacemaker 2006 appendectomy hysterectomy cholecystectomy back surgery x 2 CEA 4-07 L hip replacment 6-07 kidney stone removal  Lumbar fusion cataract surgery ankle surgery  Social History: Reviewed history from 12/18/2006 and no changes required. Retired from [employer] Never  Smoked 3 grown children Widowed.  Lifes in ALF in [place].  Ambulates w/ walker. Alcohol use-no Regular exercise-no  Review of Systems      See HPI  Physical Exam  General:  alert, well-developed, and well-nourished.  alert, well-developed, and well-nourished.   Head:  normocephalic and atraumatic.  normocephalic and atraumatic.   Eyes:  Pupils equal, round and reactive to light.  Conjunctival hemorrhage from 2-10 o'clock positions with green tint in the 6-9 o'clock area.  Mild orbital pain with palpation. Neurologic:  Cranial Nerves III, IV and VI intact. Skin:  color normal.     Impression & Recommendations:  Problem # 1:  EYE PAIN (ICD-379.91)  Patient with three day history of redness in eye, mild photophobia and dull pain in right eye.  Exam revealed extensive subconjunctival hemorrhage, elevated BP and mild orbital pain.  Concern for acute angle closure glaucoma vs. corneal ulcer vs. keratitis or uveilits.  I called Dr. Nile Riggs, her ophthalmologist and he has agreed to see her immediately.  Vision normal today on exam.  Orders: Vision Screen [phone])  Complete Medication List: 1)  Nexium 40 Mg Cpdr (Esomeprazole magnesium) .... Take 1 tablet by mouth two times a day 2)  Isosorbide Mononitrate Cr 30 Mg Xr24h-tab (Isosorbide mononitrate) .... Take 1  tablet by mouth once a day 3)  Calcarb 600/d 600-125 Mg-unit Tabs (Calcium-vitamin d) .Marland Kitchen.. 1 tab by mouth bid 4)  Nitroglycerin 0.4 Mg Subl (Nitroglycerin) .... Prn 5)  Tylenol 325 Mg Tabs (Acetaminophen) .... 2 tab q6 h prn 6)  Furosemide 40 Mg Tabs (Furosemide) .Marland Kitchen.. 1 tab by mouth qam x 7 days then back to 20 mg every other day. 7)  Miralax Powd (Polyethylene glycol 3350) .Marland KitchenMarland KitchenMarland Kitchen 17 g by mouth daily as needed 8)  Colace 100 Mg Caps (Docusate sodium) .... Two times a day by mouth 9)  Preparation H 0.25-3-14-71.9 % Oint (Phenyleph-shark liv oil-mo-pet) .... Apply three times a day and after bms as needed 10)  Adult Aspirin Low Strength 81  Mg Tbdp (Aspirin) .... Take 1 tablet by mouth once a day 11)  Meclizine Hcl 12.5 Mg Tabs (Meclizine hcl) .... Take 1 tablet by mouth once a day as needed dizziness 12)  Vicodin 5-500 Mg Tabs (Hydrocodone-acetaminophen) .... Take one by mouth every six hours as needed pain 13)  Januvia 50 Mg Tabs (Sitagliptin phosphate) .Marland Kitchen.. 1 tab by mouth daily 14)  Potassium Chloride Cr 10 Meq Cr-tabs (Potassium chloride) .... Take 1 tablet by mouth once a day 15)  Loratadine 10 Mg Tabs (Loratadine) .Marland Kitchen.. 1 tab by mouth daily 16)  Sanctura Xr 60 Mg Xr24h-cap (Trospium chloride) .Marland Kitchen.. 1 capsule by mouth daily 17)  Zofran 8 Mg Tabs (Ondansetron hcl) .Marland Kitchen.. 1 tab by mouth q 8 hrs as needed nausea 18)  Gas-x 80 Mg Chew (Simethicone) .Marland Kitchen.. 1 tab by mouth q 4 hrs as needed gas pain 19)  Premarin 0.625 Mg/gm Crea (Estrogens, conjugated) .... Use pervaginal are 3 x a wk 20)  Zolpidem Tartrate 10 Mg Tabs (Zolpidem tartrate) .... Take 1 tab by mouth at bedtime as needed 21)  Bupropion Hcl 200 Mg Xr12h-tab (Bupropion hcl) .... Take 1 tab by mouth two times a day 22)  Clonazepam 0.5 Mg Tabs (Clonazepam) .Marland Kitchen.. 1 tab by mouth two times a day as needed anxiety 23)  Fleets Enema  .... Use enema x1; repeat in 12 hrs if no results for a max of 2. 24)  Prune Juice 8 Oz + Benefiber Mixed in  .... Drink daily as directed 25)  Dulcolax 10 Mg Supp (Bisacodyl) .Marland Kitchen.. 1 suppository pr daily as needed for constipation 26)  Glimepiride 1 Mg Tabs (Glimepiride) .Marland Kitchen.. 1 tab by mouth daily with lunch 27)  Manual Wheelchair  .... Use as directed weakness, diabetic neuropathy 28)  Pravastatin Sodium 40 Mg Tabs (Pravastatin sodium) .Marland Kitchen.. 1 tab by mouth qhs 29)  Metoprolol Succinate 50 Mg Xr24h-tab (Metoprolol succinate) .Marland Kitchen.. 1 tab by mouth daily 30)  Doxepin Hcl 10 Mg Caps (Doxepin hcl) .Marland Kitchen.. 1 capsule by mouth qhs 31)  Alprazolam 0.5 Mg Tabs (Alprazolam) .Marland Kitchen.. 1 tab by mouth two times a day as needed anxiety  Patient Instructions: 1)  Present to  [facility] [place] NOW. 2)  Call me if any problems. 3)  Return for f/u BP in 1 month, sooner if needed for anything.   Orders Added: 1)  Est. Patient Level III [21308] 2)  Vision Screen [[id number]]  Appended Document: eye pain/ red     Vision Screening:Left eye w/o correction: 20 / 50 Right Eye w/o correction: 20 / 50 Both eyes w/o correction:  20/ 50       Vision Comments: Pt forgot glasses  Vision Entered By: Payton Spark CMA (March 16, 2010 8:28 AM)  Allergies: 1)  ! Codeine Sulfate (Codeine Sulfate)   Complete Medication List: 1)  Nexium 40 Mg Cpdr (Esomeprazole magnesium) .... Take 1 tablet by mouth two times a day 2)  Isosorbide Mononitrate Cr 30 Mg Xr24h-tab (Isosorbide mononitrate) .... Take 1 tablet by mouth once a day 3)  Calcarb 600/d 600-125 Mg-unit Tabs (Calcium-vitamin d) .Marland Kitchen.. 1 tab by mouth bid 4)  Nitroglycerin 0.4 Mg Subl (Nitroglycerin) .... Prn 5)  Tylenol 325 Mg Tabs (Acetaminophen) .... 2 tab q6 h prn 6)  Furosemide 40 Mg Tabs (Furosemide) .Marland Kitchen.. 1 tab by mouth qam x 7 days then back to 20 mg every other day. 7)  Miralax Powd (Polyethylene glycol 3350) .Marland KitchenMarland KitchenMarland Kitchen 17 g by mouth daily as needed 8)  Colace 100 Mg Caps (Docusate sodium) .... Two times a day by mouth 9)  Preparation H 0.25-3-14-71.9 % Oint (Phenyleph-shark liv oil-mo-pet) .... Apply three times a day and after bms as needed 10)  Adult Aspirin Low Strength 81 Mg Tbdp (Aspirin) .... Take 1 tablet by mouth once a day 11)  Meclizine Hcl 12.5 Mg Tabs (Meclizine hcl) .... Take 1 tablet by mouth once a day as needed dizziness 12)  Vicodin 5-500 Mg Tabs (Hydrocodone-acetaminophen) .... Take one by mouth every six hours as needed pain 13)  Januvia 50 Mg Tabs (Sitagliptin phosphate) .Marland Kitchen.. 1 tab by mouth daily 14)  Potassium Chloride Cr 10 Meq Cr-tabs (Potassium chloride) .... Take 1 tablet by mouth once a day 15)  Loratadine 10 Mg Tabs (Loratadine) .Marland Kitchen.. 1 tab by mouth daily 16)  Sanctura Xr 60  Mg Xr24h-cap (Trospium chloride) .Marland Kitchen.. 1 capsule by mouth daily 17)  Zofran 8 Mg Tabs (Ondansetron hcl) .Marland Kitchen.. 1 tab by mouth q 8 hrs as needed nausea 18)  Gas-x 80 Mg Chew (Simethicone) .Marland Kitchen.. 1 tab by mouth q 4 hrs as needed gas pain 19)  Premarin 0.625 Mg/gm Crea (Estrogens, conjugated) .... Use pervaginal are 3 x a wk 20)  Zolpidem Tartrate 10 Mg Tabs (Zolpidem tartrate) .... Take 1 tab by mouth at bedtime as needed 21)  Bupropion Hcl 200 Mg Xr12h-tab (Bupropion hcl) .... Take 1 tab by mouth two times a day 22)  Clonazepam 0.5 Mg Tabs (Clonazepam) .Marland Kitchen.. 1 tab by mouth two times a day as needed anxiety 23)  Fleets Enema  .... Use enema x1; repeat in 12 hrs if no results for a max of 2. 24)  Prune Juice 8 Oz + Benefiber Mixed in  .... Drink daily as directed 25)  Dulcolax 10 Mg Supp (Bisacodyl) .Marland Kitchen.. 1 suppository pr daily as needed for constipation 26)  Glimepiride 1 Mg Tabs (Glimepiride) .Marland Kitchen.. 1 tab by mouth daily with lunch 27)  Manual Wheelchair  .... Use as directed weakness, diabetic neuropathy 28)  Pravastatin Sodium 40 Mg Tabs (Pravastatin sodium) .Marland Kitchen.. 1 tab by mouth qhs 29)  Metoprolol Succinate 50 Mg Xr24h-tab (Metoprolol succinate) .Marland Kitchen.. 1 tab by mouth daily 30)  Doxepin Hcl 10 Mg Caps (Doxepin hcl) .Marland Kitchen.. 1 capsule by mouth qhs 31)  Alprazolam 0.5 Mg Tabs (Alprazolam) .Marland Kitchen.. 1 tab by mouth two times a day as needed anxiety

## 2010-03-24 LAB — URINE CULTURE: Colony Count: >=100000

## 2010-03-24 LAB — URINALYSIS, ROUTINE W REFLEX MICROSCOPIC
Bilirubin Urine: NEGATIVE
Glucose, UA: NEGATIVE mg/dL
Ketones, ur: NEGATIVE mg/dL
pH: 6.5 (ref 5.0–8.0)

## 2010-03-24 LAB — DIFFERENTIAL
Basophils Relative: 2 % — ABNORMAL HIGH (ref 0–1)
Eosinophils Absolute: 0.1 10*3/uL (ref 0.0–0.7)
Monocytes Relative: 6 % (ref 3–12)
Neutrophils Relative %: 65 % (ref 43–77)

## 2010-03-24 LAB — URINE MICROSCOPIC-ADD ON

## 2010-03-24 LAB — BASIC METABOLIC PANEL
BUN: 15 mg/dL (ref 6–23)
CO2: 32 mEq/L (ref 19–32)
Chloride: 104 mEq/L (ref 96–112)
Creatinine, Ser: 1.3 mg/dL — ABNORMAL HIGH (ref 0.4–1.2)

## 2010-03-24 LAB — CBC
MCHC: 33.7 g/dL (ref 30.0–36.0)
MCV: 93.7 fL (ref 78.0–100.0)
Platelets: 249 10*3/uL (ref 150–400)

## 2010-04-06 LAB — URINALYSIS, ROUTINE W REFLEX MICROSCOPIC
Bilirubin Urine: NEGATIVE
Glucose, UA: 100 mg/dL — AB
Hgb urine dipstick: NEGATIVE
Ketones, ur: NEGATIVE mg/dL
Nitrite: NEGATIVE
Protein, ur: NEGATIVE mg/dL
Specific Gravity, Urine: 1.012 (ref 1.005–1.030)
Urobilinogen, UA: 0.2 mg/dL (ref 0.0–1.0)
pH: 7 (ref 5.0–8.0)

## 2010-04-06 LAB — DIFFERENTIAL
Basophils Absolute: 0.1 K/uL (ref 0.0–0.1)
Basophils Relative: 2 % — ABNORMAL HIGH (ref 0–1)
Eosinophils Absolute: 0.1 10*3/uL (ref 0.0–0.7)
Eosinophils Relative: 1 % (ref 0–5)
Lymphocytes Relative: 22 % (ref 12–46)
Lymphs Abs: 1.6 10*3/uL (ref 0.7–4.0)
Monocytes Absolute: 0.7 10*3/uL (ref 0.1–1.0)
Monocytes Relative: 9 % (ref 3–12)
Neutro Abs: 4.8 K/uL (ref 1.7–7.7)
Neutrophils Relative %: 67 % (ref 43–77)

## 2010-04-06 LAB — COMPREHENSIVE METABOLIC PANEL
ALT: 13 U/L (ref 0–35)
AST: 24 U/L (ref 0–37)
Albumin: 4.3 g/dL (ref 3.5–5.2)
CO2: 22 mEq/L (ref 19–32)
Calcium: 9.6 mg/dL (ref 8.4–10.5)
GFR calc Af Amer: 57 mL/min — ABNORMAL LOW (ref >60)
GFR calc non Af Amer: 47 mL/min — ABNORMAL LOW (ref >60)
Sodium: 140 mEq/L (ref 135–145)
Total Protein: 7.4 g/dL (ref 6.0–8.3)

## 2010-04-06 LAB — CBC
HCT: 40.8 % (ref 36.0–46.0)
Hemoglobin: 14 g/dL (ref 12.0–15.0)
MCHC: 34.3 g/dL (ref 30.0–36.0)
MCV: 93.1 fL (ref 78.0–100.0)
Platelets: 234 10*3/uL (ref 150–400)
RBC: 4.38 MIL/uL (ref 3.87–5.11)
RDW: 12.8 % (ref 11.5–15.5)
WBC: 7.3 K/uL (ref 4.0–10.5)

## 2010-04-06 LAB — HEMOCCULT GUIAC POC 1CARD (OFFICE): Fecal Occult Bld: NEGATIVE

## 2010-04-06 LAB — LIPASE, BLOOD: Lipase: 65 U/L (ref 23–300)

## 2010-04-06 LAB — COMPREHENSIVE METABOLIC PANEL WITH GFR
Alkaline Phosphatase: 88 U/L (ref 39–117)
BUN: 12 mg/dL (ref 6–23)
Chloride: 102 meq/L (ref 96–112)
Creatinine, Ser: 1.1 mg/dL (ref 0.4–1.2)
Glucose, Bld: 192 mg/dL — ABNORMAL HIGH (ref 70–99)
Potassium: 4.3 meq/L (ref 3.5–5.1)
Total Bilirubin: 0.6 mg/dL (ref 0.3–1.2)

## 2010-04-07 ENCOUNTER — Encounter: Payer: Self-pay | Admitting: Family Medicine

## 2010-04-13 LAB — URINE MICROSCOPIC-ADD ON

## 2010-04-13 LAB — URINALYSIS, ROUTINE W REFLEX MICROSCOPIC
Bilirubin Urine: NEGATIVE
Glucose, UA: NEGATIVE mg/dL
Specific Gravity, Urine: 1.028 (ref 1.005–1.030)
pH: 6 (ref 5.0–8.0)

## 2010-04-13 LAB — URINE CULTURE

## 2010-04-16 ENCOUNTER — Encounter: Payer: Self-pay | Admitting: Family Medicine

## 2010-04-16 ENCOUNTER — Telehealth: Payer: Self-pay | Admitting: Family Medicine

## 2010-04-16 NOTE — Telephone Encounter (Signed)
YESTERDAY 04/15/10-Ann Hook from [facility] called in re: [name] ..... Has a question about patient care... Call [phone]... Thanks, [employer]

## 2010-04-20 ENCOUNTER — Telehealth: Payer: Self-pay | Admitting: *Deleted

## 2010-04-20 NOTE — Telephone Encounter (Signed)
Spoke to ann and and she didn't know why she called and she states they faxed over a paper and waiting on it to come back. Didn't say what it was for but she said if there was anything else she would call

## 2010-04-21 ENCOUNTER — Encounter: Payer: Self-pay | Admitting: Family Medicine

## 2010-04-21 ENCOUNTER — Ambulatory Visit (INDEPENDENT_AMBULATORY_CARE_PROVIDER_SITE_OTHER): Payer: Medicare Other | Admitting: Family Medicine

## 2010-04-21 DIAGNOSIS — E119 Type 2 diabetes mellitus without complications: Secondary | ICD-10-CM

## 2010-04-21 DIAGNOSIS — I10 Essential (primary) hypertension: Secondary | ICD-10-CM

## 2010-04-21 DIAGNOSIS — F411 Generalized anxiety disorder: Secondary | ICD-10-CM

## 2010-04-21 NOTE — Assessment & Plan Note (Signed)
A1C last month 7.3.  Labs UTD.  Eye exam due soon.  Unable to give urine today for microalbumin.  Doing well overall.  No changes to meds today.

## 2010-04-21 NOTE — Assessment & Plan Note (Signed)
Stable.  She seems happier today and more calm now that she is getting better care at her new ALF.

## 2010-04-21 NOTE — Patient Instructions (Signed)
BP recheck:  A1C 7.06 March 2010 = good!  Physical therapy will help work on your strength.  Labs are up to date.  Dilated eye exam due soon.  Return for f/u Diabetes/ BP in 3 mos.

## 2010-04-21 NOTE — Progress Notes (Signed)
  Subjective:    Patient ID: [name], female    DOB: 02/06/1923, 77 y.o.   MRN: [id number]  HPI 77 yo WF presents for f/u visit.  She is doing well.  She moved to a new ALF in [place] and seems to be thriving better here.  She seems happier.  Her daughter is here with her today and reports that she is getting her meds when she's supposed to, drinking more water and that the food is better.  She plans to start PT soon to work on her strength since she c/o fatige and weakness all the time.  The transfer of all of her meds went well.  Her A1C last month was 7.3.  BP 157/78  Pulse 59  Ht 5' 4.5" (1.638 m)  Wt 197 lb (89.359 kg)  BMI 33.29 kg/m2  SpO2 99% REpeat BP 138/88.    Review of Systems  Constitutional: Positive for fatigue. Negative for fever, chills and appetite change.  Eyes: Negative for visual disturbance.  Respiratory: Negative for shortness of breath and wheezing.   Cardiovascular: Negative for chest pain, palpitations and leg swelling.  Gastrointestinal: Positive for nausea. Negative for vomiting, diarrhea and blood in stool.  Genitourinary: Negative for dysuria and difficulty urinating.  Neurological: Positive for weakness. Negative for headaches.  Psychiatric/Behavioral: Negative for suicidal ideas, sleep disturbance and dysphoric mood. The patient is nervous/anxious.        Objective:   Physical Exam  Constitutional: She appears well-developed and well-nourished. No distress.  HENT:  Head: Normocephalic and atraumatic.  Eyes: Conjunctivae are normal. No scleral icterus.  Neck: No thyromegaly present.  Cardiovascular: Normal rate and regular rhythm.   Pulmonary/Chest: Effort normal and breath sounds normal. No respiratory distress. She has no wheezes. She has no rales.  Abdominal: Soft. There is no tenderness.  Musculoskeletal: She exhibits edema (1+ nonpitting foot/ ankle edema bilat).  Lymphadenopathy:    She has no cervical adenopathy.  Skin: Skin is warm  and dry.  Psychiatric: She has a normal mood and affect.          Assessment & Plan:

## 2010-04-21 NOTE — Assessment & Plan Note (Signed)
Recheck BP looks good.  Continue current meds.  Labs utd.

## 2010-04-22 NOTE — Telephone Encounter (Signed)
v

## 2010-05-03 ENCOUNTER — Ambulatory Visit: Payer: Self-pay | Admitting: Family Medicine

## 2010-05-07 ENCOUNTER — Telehealth: Payer: Self-pay | Admitting: Family Medicine

## 2010-05-07 MED ORDER — LEVOFLOXACIN 750 MG PO TABS: 750.0000 mg | ORAL_TABLET | Freq: Every day | ORAL | Status: AC

## 2010-05-07 NOTE — Telephone Encounter (Signed)
Pls call daughter and let her know that I'm starting Jenese on Levaquin.  It looks like she has a UTI.   I will f/u the cx results on Monday.

## 2010-05-13 NOTE — Telephone Encounter (Signed)
Daughter aware of the above

## 2010-05-18 NOTE — Assessment & Plan Note (Signed)
[facility]                         GASTROENTEROLOGY OFFICE NOTE   NAME:[name], [name]                     MRN:          [id number]  DATE:11/07/2006                            DOB:          1923-08-03    REFERRING PHYSICIAN:  Seymour Bars, D.O., her primary care physician.   REASON FOR CONSULTATION:  Dr. Cathey Endow asked me to evaluate the patient in  consultation regarding abdominal pain.   HISTORY OF PRESENT ILLNESS:  The patient is a pleasant, mildly demented  77 year old woman who has had decades of abdominal discomfort.  She was  a former longterm patient of Ulyess Mort, M.D., last seen here  actually in 1995.  She had undergone multiple upper and lower  endoscopies, abdominal imaging, essentially from what I can tell from  his review of the records, she had irritable bowel syndrome and  functional dyspepsia.  She was recently admitted for worsening of her  abdominal epigastric pain.  They thought this may be cardiac in origin  and so observed her in the hospital for 2-3 days.  She was admitted in  mid September and did undergo an abdominal and pelvic CT with IV and  oral contrast and this was essentially normal.  She is somewhat unclear  about the nature of the pain.  She says she has severe pain, gasses,  bloating, severe acid problems, and regurgitation, but she is quite  repetitive in nature and brings up things that she has already said  several times in the visit and so I am not sure she is the most accurate  historian.   REVIEW OF SYSTEMS:  Notable for stable weight.  Otherwise essentially  normal by nurse intake sheet.   PAST MEDICAL HISTORY:  Hypertension, CHF, arrhythmia, problems with a  pacemaker in 2005, diabetes, elevated cholesterol, arthritis, anxiety,  hip replacement in 2007, cataracts removed in 2008, spinal fusion in  1970, depression, kidney stones, status post open cholecystectomy in the  1960's, status post  hysterectomy in the 1960's, status post appendectomy  as a teenager.   CURRENT MEDICATIONS:  Lisinopril, alprazolam, nitroglycerin, Tylenol,  Lasix, Flonase, zolpidem, bupropion, Paxil, Januvia, Darvocet, Zofran,  Detrol, preparation H periodically, aspirin, Phenergan, Aricept,  meclizine, and Nexium.   ALLERGIES:  CODEINE which causes nausea.   SOCIAL HISTORY:  Widowed.  Lives in [facility].  She has three children.  Nonsmoker and nondrinker.   FAMILY HISTORY:  No colon cancer or colon polyps in the family.  Mother  and sibling have diabetes.  Sister had breast cancer.   PHYSICAL EXAMINATION:  VITAL SIGNS:  5 feet 3 inches, 189 pounds, blood  pressure 160/60, pulse 78.  CONSTITUTIONAL:  Generally well appearing.  NEUROLOGY:  Alert and oriented x3, although seems somewhat forgetful and  is repeating things during this visit.  NECK:  Supple with no lymphadenopathy.  CARDIOVASCULAR:  Heart regular rate and rhythm.  LUNGS:  Clear to auscultation bilaterally.  ABDOMEN:  Soft.  Mildly tender in the mid epigastrium.  EXTREMITIES:  No lower extremity edema.  SKIN:  No rashes or lesions to the lower  extremities.   ASSESSMENT:  An 77 year old woman with a longterm history of probable  functional GI discomfort.  Last seen here at [facility] 13  years ago.   I repeated some blood tests today including a complete metabolic  profile, thyroid testing, and a CBC.  She did have a CT scan of abdomen  and pelvis that was fairly unrevealing a month and a half ago.  I will  also arrange for her to have an EGD performed at her soonest  convenience.  Possibly she has peptic ulcer disease, perhaps GERD or  GERD-related dyspepsia.     Rachael Fee, MD  Electronically Signed    DPJ/MedQ  DD: 11/07/2006  DT: 11/08/2006  Job #: [id number]   cc:   Seymour Bars, D.O.

## 2010-05-18 NOTE — H&P (Signed)
NAMEAERIEL, [name] NO.:  [id number]   MEDICAL RECORD NO.:  [id number]          PATIENT TYPE:  EMS   LOCATION:  ED                           FACILITY:  [facility]   PHYSICIAN:  Gardiner Barefoot, MD    DATE OF BIRTH:  03/20/1923   DATE OF ADMISSION:  10/01/2006  DATE OF DISCHARGE:                              HISTORY & PHYSICAL   CHIEF COMPLAINT:  Abdominal pain.   HISTORY OF PRESENT ILLNESS:  This is an 77 year old female nursing home  resident who presents with a 1-day history of mid epigastric abdominal  pain. The patient reports the pain started progressively over the day  before and was only associated with some feeling of sweatiness.  She  otherwise reports no fever, nausea, vomiting, or any other associated  symptoms.  The patient reports it was not exacerbated by food.  The  patient two weeks ago did have her pacemaker manipulated.   PAST MEDICAL HISTORY:  1. Hypertension.  2. History of cholecystectomy.  3. History of hysterectomy.  4. History of pacemaker insertion.  5. Tonsillectomy.  6. Total hip arthropathy.  7. Anxiety.  8. Depression.  9. Dementia.  10.Constipation.  11.Hyperlipidemia.  12.Type 2 diabetes.  13.CAD.  14.GERD.  15.Seasonal allergies.  16.Urinary retention.   FAMILY HISTORY:  Heart disease.   SOCIAL HISTORY:  The patient lives in a nursing home and denies any  smoking, alcohol, or drugs.   DRUG ALLERGIES:  1. CODEINE.  2. PENICILLIN.   MEDICATIONS:  All her medications in her chart reviewed and they  include:  1. Alprazolam 1 mg tab t.i.d. p.r.n. anxiety.  2. Meclizine 12.5 mg tab p.r.n. daily.  3. NitroQuick 0.4 mg tabs p.r.n. for chest pain every 5 minutes.  4. Zofran 4 mg tab q.8 h. p.r.n.  5. Preparation-H.  6. Promethazine 50 mg suppository q.8 h. p.r.n. nausea.  7. Senokot-S tablet one p.o. daily.  8. Wellbutrin 150 mg p.o. daily.  9. Colace 100 mg p.o. b.i.d.  10.Oyster calcium 500 mg plus D p.o. b.i.d.  11.Darvocet-N 100 tablets, one tab p.o. b.i.d. scheduled.  12.Crestor 5 mg tablet one p.o. q.h.s.  13.MiraLax 17 grams p.o. every other day.  14.Ambien 10 mg p.o. q.h.s.  15.Tylenol 650 mg q.6 h. p.r.n.  16.Januvia 25 mg p.o. daily.  17.Actos 45 mg p.o. every other day.  18.Aricept 10 mg p.o. q.h.s.  19.Aspirin 81 mg daily.  20.Detrol LA 4 mg p.o. daily.  21.Fluticasone 50 mg nasal spray two sprays each nostril  daily.  22.Lasix 20 mg tabs, one p.o. daily.  23.Isosorbide 30 mg ER one tab p.o. daily.  24.Lisinopril 5 mg tab one p.o. daily.  25.Nexium 40 mg tab p.o. daily.  26.Paxil 20 mg p.o. daily.   REVIEW OF SYSTEMS:  Negative except as in HPI.   PHYSICAL EXAMINATION:  VITAL SIGNS:  Temperature is 97.8, pulse 76,  respirations 18, blood pressure is 142/69, O2 sat is 98% on room air.  GENERAL:  The patient is awake, alert, appears in no acute distress.  CARDIOVASCULAR:  Regular rate and rhythm with a  3/6 systolic ejection  murmur.  LUNGS:  Clear to auscultation bilaterally.  ABDOMEN:  Soft, nontender, nondistended.  Positive bowel sounds.  No  hepatosplenomegaly.  EXTREMITIES:  No cyanosis, clubbing, or edema.   LABORATORY DATA:  Sodium is 138, potassium 4.4, chloride 101, bicarb 30,  BUN 13, creatinine 1.15, glucose 123.  AST 19, ALT 15.  Lipase 20.  UA  is negative. Troponin is less than 0.05.  CT scan with no acute disease.   IMPRESSION/PLAN:  Abdominal pain.  The pain is consistent with reflux  picture being mid epigastric and the patient will be continued on her  proton pump inhibitor.  However, the symptoms although unlikely with the  episode of sweating we will make sure there is no cardiac etiology and  we will check serial enzymes and have the patient monitored on tele.      Gardiner Barefoot, MD  Electronically Signed     RWC/MEDQ  D:  10/01/2006  T:  10/01/2006  Job:  [id number]

## 2010-05-18 NOTE — Discharge Summary (Signed)
NAMESEBASTIANA, [name] NO.:  [id number]   MEDICAL RECORD NO.:  [id number]          PATIENT TYPE:  INP   LOCATION:  1443                         FACILITY:  [facility]   PHYSICIAN:  Leslie Scott, MD     DATE OF BIRTH:  Feb 26, 1923   DATE OF ADMISSION:  10/01/2006  DATE OF DISCHARGE:  10/03/2006                               DISCHARGE SUMMARY   PRIMARY MEDICAL DOCTOR:  [name], D.O. of  [facility].   TRANSFERRING FACILITY:  [facility] which is an assisted  living.   DISCHARGE DIAGNOSES:  1. Abdominal pain - resolved - differential diagnosis: gastritis      versus gastroesophageal reflux disease versus gastroparesis.  2. Hypertension.  3. Type 2 diabetes.  4. Hyperlipidemia.  5. History of gastroesophageal reflux disease.  6. Coronary artery disease.   DISCHARGE MEDICATIONS:  1. Alprazolam 1 mg p.o. 3 times a day p.r.n.  2. Meclizine 12.5 mg p.o. daily p.r.n.  3. NitroQuick 0.4 mg sublingual p.r.n. To repeat every 5 minutes for a      max of 3 doses and if pain is not relieved, to call EMS.  4. Zofran 4 mg p.o. q.8 h. p.r.n.  5. Preparation H ointment per rectum t.i.d. after bowel movement      p.r.n.  6. Promethazine 50-mg suppositories per rectum q.8 h. p.r.n.  7. Senokot-S 1 p.o. daily.  8. Wellbutrin 150 mg p.o. twice daily.  9. Colace 100 mg p.o. twice daily.  10.Calcium 500 mg plus vitamin D 1 p.o. twice daily.  11.Darvocet-N 100 one p.o. twice daily p.r.n.  12.Crestor 5 mg p.o. at bedtime.  13.MiraLax 17 g in 8 ounces of water every other day.  14.Ambien 10 mg p.o. at bedtime.  15.Tylenol 650 mg p.o. q.4-6 h. p.r.n.  16.Januvia 25 mg p.o. daily.  17.Aricept 10 mg p.o. at bedtime.  18.Enteric-coated aspirin 81 mg p.o. daily.  19.Detrol LA 4 mg p.o. daily.  20.Fluticasone 50 mcg nasal spray, 2 sprays in each nostril daily.  21.Lasix 20 mg p.o. daily.  22.Imdur 30 mg p.o. daily.  23.Lisinopril 5 mg p.o. daily.  24.Protonix 40 mg p.o. twice daily for 1 week, then once daily.  25.Paxil 40 mg p.o. daily.   DISCONTINUED MEDS:  1. Actos.  2. Nexium.   PROCEDURES:  1. CT of the abdomen with contrast.  Impression: No acute abnormality.  2. Pelvic CT with contrast.  Impression: No acute abnormality.   PERTINENT LAB RESULTS:  Basic metabolic panel with BUN of 16, creatinine  of 1.23. CBC is normal with hemoglobin 12, hematocrit 34.9.  Cardiac  panel cycled and negative.  Urinalysis negative for features of UTI.  Lipase normal at 20.  Comprehensive metabolic panel with normal liver  function tests.   CONSULTATIONS:  None.   HOSPITAL COURSE/AND PATIENT DISPOSITION:  For details of initial  admission, please refer to History and Physical note.  In summary, Ms.  [name] is a pleasant 77 year old female patient, an assisted living  resident, with history of hypertension, type 2 diabetes, coronary artery  disease, gastroesophageal reflux disease,  and dementia who presented  with a 1-day history of midepigastric abdominal pain with associated  sweating.  There was no history of nausea, vomiting, or other associated  symptoms.  She had stable vital signs and unremarkable blood tests and  CT.  She was admitted with a diagnosis of abdominal pain possibly  secondary to reflux disease/gastritis.  In any case, she was admitted to  the telemetry bed.  Her cardiac enzymes were cycled and negative.  She  was continued on her proton pump inhibitors and her home medications.  Her abdominal pain has promptly resolved.  Currently patient is  asymptomatic of any abdominal pain.  She is tolerating oral diet.  She  has had good BM.  She has not had a colonoscopy ever.  The patient has  had an EGD done more than 10 years ago at [facility] with findings of  reflux disease.  The patient is stable to be discharged and to be  followed up as an outpatient.  She has to been seen by her  gastroenterologist for a repeat upper  endoscopy to evaluate her  gastroesophageal reflux disease and rule out Barrett's esophagus.  She  also has to be considered for a colonoscopy.  The patient's daughter who  is a nurse is at her bedside.  She indicates that patient has been  discontinued off her Actos by her primary medical doctor 6 months ago.  However, she has continued to receive it.  Her Actos will be  discontinued.      Leslie Scott, MD  Electronically Signed     AH/MEDQ  D:  10/03/2006  T:  10/03/2006  Job:  [id number]   cc:   [name], D.O.  [facility]  [address]  [place], [place] [address]

## 2010-05-21 NOTE — Discharge Summary (Signed)
[name], [name] NO.:  [id number]   MEDICAL RECORD NO.:  [id number]          PATIENT TYPE:  INP   LOCATION:  6529                         FACILITY:  [facility]   PHYSICIAN:  Cristy Hilts. Jacinto Halim, MD       DATE OF BIRTH:  [birth year]   DATE OF ADMISSION:  08/31/2004  DATE OF DISCHARGE:  09/03/2004                                 DISCHARGE SUMMARY   [name] is a 77 year old female with history of a 2:1 block and pacemaker  placement May 13, 2004 with a [employer] DDD, ADx Carlynn Purl.  DDDR model number  [id number].  Presented to [facility] yesterday with left shoulder pain and  radiation down her left arm with dizziness, near syncope after dinner.  She  was treated with morphine, aspirin, Lovenox, nitroglycerin paste and then  transferred to [facility].  She stated that she had left arm pain, left  anterior chest pain and complains of weakness, tiredness, diaphoresis and  nausea.  She had a Persantine Cardiolite done August 20, 2002 that showed no  ischemia.  She was seen by Dr. Lenise Herald and admitted for cardiac  catheterization.  Her pacemaker was interrogated on August 31, 2004.  It was  pacing and sensing appropriately.  She had underlying sinus rhythm.  She  underwent cardiac catheterization by Dr. Yates Decamp on September 01, 2004.  She  has diffuse CAD.  He felt that her shortness of breath was probably  secondary to chronic diastolic heart failure.  Chest pain may be due to  small vessel disease.  She did have an LAD mid stenosis of 70%.  However,  Dr. Jacinto Halim felt that she should have a Cardiolite to see if she has any  ischemia with this and would only do intervention if it was positive  ischemia in the mid LAD territory.  Thus, he recommended Cardiolite as an  outpatient and to follow up with Dr. Jenne Campus after the [facility] is  performed.  He also added a beta blocker, ACE and Imdur and Crestor.  She  had a temperature up to 100.2 on September 02, 2004; thus, she was not  discharged home.  She was ambulated, and UA C&S was performed.  She was  found to have an UTI with 75,000 Enterococcus on September 03, 2004.  Thus,  she will be discharged home on Macrodantin.  She is allergic to penicillin.   Discharge medications will be:  1.  Tranxene 7.5 mg t.i.d.  2.  Actos 45 mg every day.  3.  Prilosec 20 mg once a day.  4.  Wellbutrin 150 mg once a day.  5.  Aspirin 152 mg once a day.  6.  Crestor 5 mg every day.  7.  Imdur 30 mg every day.  8.  Lisinopril 10 mg at bedtime.  9.  Ambien 10 mg at bedtime.  10. Colace 100 mg two at bedtime.  11. Macrobid 100 mg b.i.d. for seven days.   Blood pressure was 120/42, heart rate 72, respirations 20, temperature is  98.  She was afebrile for 24 hours.  Last labs showed a hemoglobin of 10.4,  hematocrit 30.0, platelets 156, WBCs 4.4.  Sodium 139, potassium 4.2, BUN  18, creatinine 1.3, glucose 116.  Total cholesterol was 256, triglycerides  209; HDL was 37 and LDL was 177.  CK-MBs and troponins were negative.  TSH  was 1.323.  No x-ray was done at [facility].   DISCHARGE DIAGNOSES:  1.  Chest pain, USAP.  2.  Coronary artery disease, status post cardiac catheterization with      diffuse disease, high grade 70% in her mid left anterior descending,      multiple other diffuse blockages.  Unknown if this is causing her angina      at this time.  She will have an outpatient Cardiolite to determine if      there is any ischemia.  Chest pain may also be due to small vessel      disease.  She is placed on medical therapy.  3.  Hypertension.  4.  Ejection fraction of 65%.  5.  Diastolic dysfunction.  6.  Near syncope.  7.  History of PTVP implant secondary to 2:1 block.  8.  Adult-onset diabetes mellitus type 2, non-insulin dependent.  9.  Gastroesophageal reflux disease.  10. Depression.  11. Urinary tract infection, Enterococcus 75,000, placed on Macrobid 100 mg      b.i.d. for seven days at the time  discharge.      [name], N.P.      Cristy Hilts. Jacinto Halim, MD  Electronically Signed    BB/MEDQ  D:  09/03/2004  T:  09/03/2004  Job:  [id number]

## 2010-05-21 NOTE — Discharge Summary (Signed)
[name], BIN NO.:  [id number]   MEDICAL RECORD NO.:  [id number]          PATIENT TYPE:  INP   LOCATION:  2039                         FACILITY:  [facility]   PHYSICIAN:  Balinda Quails, M.D.    DATE OF BIRTH:  Aug 19, 1923   DATE OF ADMISSION:  05/02/2005  DATE OF DISCHARGE:  05/04/2005                                 DISCHARGE SUMMARY   ADMISSION DIAGNOSIS:  Severe left internal carotid artery stenosis.   DISCHARGE DIAGNOSES:  1.  Severe left internal carotid artery stenosis, status post left carotid      endarterectomy.  2.  Urinary tract infection.  3.  Type 2 diabetes mellitus.  4.  Hypertension.  5.  Coronary artery disease.  6.  Second degree block status post pacemaker insertion.  7.  Hyperlipidemia.  8.  Congestive heart failure.   REFERRING PHYSICIAN:  None.   CONSULTATIONS:  None.   PROCEDURE:  On May 02, 2005, the patient underwent a left internal carotid  artery with Dacron patch angioplasty by [name], M.D.   HISTORY OF PRESENT ILLNESS:  The patient is a 77 year old female scheduled  to undergo left total arthroplasty for degenerative osteoarthritis of the  left hip.  However, she was found during preoperative evaluation to have  left carotid bruit.  Carotid Doppler evaluation verified a severe left  internal carotid artery stenosis.  The patient has undergone four vessel  arteriography carried out by Nanetta Batty, M.D. on April 12, 2005.  This  revealed a type 1 aortic arch and normal vertebral flow bilaterally.  There  was a normal right carotid bifurcation.  The left carotid bifurcation  revealed an 80% proximal left internal carotid artery stenosis.  The patient  has no history of TIA's.  She denies visual, sensory, and motor deficits.  The patient has no history of documented stroke.   HOSPITAL COURSE:  The patient's hospital course was uneventful.  The patient  progressed as expected.  On postoperative day #1, the  patient's vital signs  remained stable.  Neuro was intact.  The patient's labs were within normal  limits.  The patient was ambulating with a slightly unsteady gait.  She was  able to void and eat.  The patient was instructed to continue her incentive  spirometry.  The patient's discharge was held on May 03, 2005, due to her  unsteady feet.  On postoperative day #2, the patient remained neurologically  intact.  She was ambulating and much steadier.  The patient did complain of  some bladder pressure.  Prior to admission, the patient was on Cipro for a  UTI.  A urinalysis and culture were sent on May 04, 2005, and the patient was  restarted on Cipro empirically.  The patient will be discharged back to  assisted living this afternoon.   PHYSICAL EXAMINATION:  VITAL SIGNS:  Blood pressure 131/58, heart rate 73,  respirations 20, temperature 97.8, O2 saturations 95% on room air, I&O's -  690.  The patient remains afebrile.  CARDIOVASCULAR:  Regular rate and rhythm.  LUNGS:  Clear to auscultation bilaterally.  EXTREMITIES:  No edema.  Incisions clear, dry, and intact.   LABORATORY DATA:  None.   CONDITION ON DISCHARGE:  Good.   DISPOSITION:  The patient will be discharged back to assisted living.   MEDICATIONS:  1.  Flonase nasal inhalation two sprays b.i.d.  2.  Lasix 20 mg Leslieo. daily.  3.  Hydrocodone 5/500 one Leslieo. q.4 hours Leslier.n.  4.  Cyclobenzaprine 5 mg Leslieo. t.i.d. Leslier.n.  5.  Cipro 500 mg Leslieo. b.i.d. x10 days.  6.  Oxycodone one to two tablets every 4 hours Leslier.n.  7.  Aleve two Leslieo. b.i.d. Leslier.n.  8.  Lorazepam 2 mg Leslieo. q.i.d. Leslier.n.  9.  Lactulose 10/15 mL Leslieo. daily Leslier.n.  10. Meclizine 25 mg Leslieo. every 6 hours Leslier.n.  11. NitroQuick 0.4 mg sublingual Leslier.n.  12. Clorazepate 7.5 mg Leslieo. t.i.d.  13. Ambien 10 mg Leslieo. q.h.s.  14. Actos 45 mg Leslieo. daily.  15. Dulcolax 100 mg two Leslieo. q.h.s.  16. Crestor 5 mg Leslieo. daily.  17. Isosorbide 30 mg Leslieo. daily.  18. Lisinopril  10 mg Leslieo. q.h.s.  19. Nexium 40 mg Leslieo. daily.  20. Enteric-coated aspirin 81 mg Leslieo. daily.  21. Bupropion 150 mg Leslieo. b.i.d.   DISCHARGE INSTRUCTIONS:  The patient is instructed to follow a low fat, low  salt, diabetic diet.  The patient is to increase activity slowly.  She is to  ambulate 3-4 times daily.  The patient is instructed to continue her  breathing exercises.  She is to do no driving or heavy lifting greater than  10 pounds for 3 weeks.  The patient may shower and clean her wounds with  mild soap and water.  The patient is instructed to call the office for any  problems such as incision erythema, drainage, temperature greater than  101.5.   FOLLOW UP:  The patient has a follow-up appointment with Dr. Madilyn Fireman in 3  weeks.  The office will contact her with a date and time.  If the office  does not contact the patient by the end of the week, please have her call  the office at [phone].      Leslie Holster, PA      [name], M.D.  Electronically Signed    JMW/MEDQ  D:  05/04/2005  T:  05/04/2005  Job:  [id number]

## 2010-05-21 NOTE — Cardiovascular Report (Signed)
[name], [name] NO.:  [id number]   MEDICAL RECORD NO.:  [id number]          PATIENT TYPE:  AMB   LOCATION:  SDS                          FACILITY:  [facility]   PHYSICIAN:  Nanetta Batty, M.D.   DATE OF BIRTH:  08-30-1923   DATE OF PROCEDURE:  04/12/2005  DATE OF DISCHARGE:                              CARDIAC CATHETERIZATION   PROCEDURE:  Arch aortogram and cerebral angiogram.   [name] is a 77 year old female with noncritical CAD and permanent  transvenous pacemaker implantation who was found to have a high-grade left  ICA stenosis by duplex ultrasound prior to elective hip. She presents now  for cerebral angiography to define her anatomy prior to her elective  surgery.   PROCEDURE DESCRIPTION:  The patient is brought to the second floor [facility] peripheral vascular angiographic suite in a post __________ state. She  was not premedicated. The right and left groin was prepped and shaved in the  usual sterile fashion. Then 1% Xylocaine was used for local anesthesia. A 5-  French sheath was inserted into right femoral artery using Seldinger  technique. A 5-French long pigtail catheter and JB1 catheters were used for  arch angiogram, selective four vessel cerebral angiography both intra- and  extracranial views.   ANGIOGRAPHIC RESULTS:  1.  Arch aortogram--type 1 arch.  2.  Right vertebral--normal intra- and extracranial anatomy.  3.  Right coronary artery--normal bifurcation, normal intracranial anatomy.      The right anterior cerebral fills via the left system.  4.  Left carotid--80% __________ ostial left ICA stenosis. The intracranial      anatomy appears normal with filling of both the left and right anterior      cerebral arteries.  5.  Left vertebral--normal intra- and extracranial anatomy.   IMPRESSION:  [name] has high-grade ostial left internal carotid artery  stenosis in the 80% range. It is fairly smooth and focal. Her right femoral  arterial puncture site was [employer] with excellent hemostasis. The patient  left the lab in stable condition. She will be hydrated over the next several  hours, ambulated, and discharged home.  The patient is to follow up Dr.  Lenise Herald. She left the lab in stable condition.      Nanetta Batty, M.D.  Electronically Signed     JB/MEDQ  D:  04/12/2005  T:  04/12/2005  Job:  [id number]   cc:   [facility]   Juluis Mire, M.D.  Fax: [phone]

## 2010-05-21 NOTE — Op Note (Signed)
NAMENATAKI, MCCRUMB NO.:  [id number]   MEDICAL RECORD NO.:  [id number]          PATIENT TYPE:  INP   LOCATION:  3309                         FACILITY:  [facility]   PHYSICIAN:  Balinda Quails, M.D.    DATE OF BIRTH:  Aug 16, 1923   DATE OF PROCEDURE:  05/02/2005  DATE OF DISCHARGE:                                 OPERATIVE REPORT   SURGEON:  P Bud Face, MD   ASSISTANT:  [name], PA.   ANESTHETIC:  General endotracheal.   PREOPERATIVE DIAGNOSIS:  Severe left internal carotid artery stenosis.   POSTOPERATIVE DIAGNOSIS:  Severe left internal carotid artery stenosis.   PROCEDURE:  Left carotid endarterectomy with Dacron patch angioplasty.   CLINICAL NOTE:  Lindsea Olivar is a 77 year old female with asymptomatic  high-grade left internal carotid artery stenosis.  This was verified by  Doppler and arteriography.  She was seen in consultation the office, is  brought to the operating at this time for reduction of stroke risk with a  left carotid endarterectomy.  She is consented to the procedure.  Understands potential risks of the procedure with a major morbidity  mortality of 1-2%.   OPERATIVE PROCEDURE:  The patient brought to the operating room stable  hemodynamic condition.  Placed in supine position.  General endotracheal  anesthesia induced.  Foley catheter and arterial line was in place.  The  left neck prepped and draped in sterile fashion.   Curvilinear skin incision made along the anterior border of the left  sternomastoid muscle.  Dissection carried down through subcutaneous tissue  with electrocautery.  Platysma incised.  Deep dissection carried down to  expose the carotid bifurcation.  The facial vein ligated with 3-0 silk and  divided.  Carotid bifurcation exposed.  The common carotid artery mobilized  down to the omohyoid muscle encircled with vessel loop.  Distal dissection  then carried up along the internal carotid artery to the  posterior belly of  digastric muscle where it was encircled with vessel loop.  The vagus nerve  reflected posteriorly and preserved.  Hypoglossal nerve clearly identified  and preserved.  The origin of the superior thyroid and external carotid were  freed and encircled with vessel loops.   The patient administered a total of 7000 units heparin intravenously.  Adequate circulation time permitted.  The carotid vessels controlled with  clamps.  Longitudinal arteriotomy made in the distal common carotid artery.  The arteriotomy extended across carotid bulb up into the internal carotid  artery.  There was a severely ulcerated plaque present in the left internal  carotid artery.  A high-grade stenosis present.  Shunt was inserted.   An endarterectomy elevator used to remove the plaque.  Plaque raised down  into the common carotid artery where it was divided transversely with Potts  scissors.  At the bifurcation the external carotid and superior thyroid were  endarterectomized using an eversion technique.  The distal internal carotid  artery plaque then feathered out.  A good endpoint was obtained.  Site  irrigated with heparin saline solution and fragments of plaque removed with  fine forceps.  A patch angioplasty endarterectomy site was then carried out  using running 6-0 Prolene suture with a Finesse Dacron patch.  The  completion of the patch angioplasty shunt was removed.  All vessels well  flushed.  Clamps removed directing initial antegrade flow up the external  carotid artery, following this internal carotid was released.   Excellent pulse and Doppler signal in the distal internal carotid artery.  Adequate hemostasis obtained.  The patient administered 50 mg protamine  intravenously.   The sternomastoid fascia closed running 2-0 Vicryl suture.  Platysma closed  running 3-0 Vicryl suture.  Skin closed with 4-0 Monocryl.  Steri-Strips  applied.   The patient tolerated procedure well.   Transferred to recovery room in  stable condition.  Neurologically intact.  No apparent complications.      Balinda Quails, M.D.  Electronically Signed     PGH/MEDQ  D:  05/02/2005  T:  05/03/2005  Job:  [id number]

## 2010-05-21 NOTE — H&P (Signed)
[name], [name] NO.:  [id number]   MEDICAL RECORD NO.:  [id number]          PATIENT TYPE:  INP   LOCATION:  2114                         FACILITY:  [facility]   PHYSICIAN:  Ulyses Amor, MD DATE OF BIRTH:  May 26, 1923   DATE OF ADMISSION:  05/12/2004  DATE OF DISCHARGE:                                HISTORY & PHYSICAL   HISTORY OF PRESENT ILLNESS:  [name] is a 77 year old white woman who  was transferred from [facility] to [facility] for  further evaluation of bradycardia. The patient presented to the emergency  department at [facility] with a four-day history of dizziness and  generalized fatigue. She reported no chest pain, tightness, heaviness,  pressure, squeezing, nor was there syncope or near syncope. In the emergency  department, she was found to be bradycardic in the 30s. Her rhythm was  secondary degree AV block with 2:1 AV conduction. She was admitted and acute  myocardial infarction was excluded and she was then transferred here to  [facility] for further management.   The patient has a remote history of congestive heart failure but it is not  clear what the etiology was. There is no history of myocardial infarction or  coronary artery disease.   The patient has a number of risk factors for cardiac disease including  hypertension, type 2 diabetes mellitus and dyslipidemia. She does not smoke.  Her only other medical problem is depression.   MEDICATIONS:  Clorazepate, Ambien, Actos, Wellbutrin, Nexium, Xanax,  Vicodin, furosemide, Colace, Phenergan, Lactulose, and Darvocet.   ALLERGIES:  CODEINE and PENICILLIN.   SOCIAL HISTORY:  The patient is widowed. She lives in assisted living  facility. She neither smokes nor drinks.   REVIEW OF SYMPTOMS:  Review of systems reveals no problems related to head,  eyes, ears, nose, mouth, throat, lungs, gastrointestinal system,  genitourinary  system, or extremities. There is no history of neurologic or  psychiatric disorder. There is no history of fever, chills, or weight loss.   FAMILY HISTORY:  Noncontributory.   PAST SURGICAL HISTORY:  1.  Cholecystectomy.  2.  Hysterectomy.  3.  Appendectomy.  4.  Removal of kidney stones.  5.  Back surgery.   PHYSICAL EXAMINATION:  VITAL SIGNS:  Blood pressure 128/46, pulse 38 and  regular, respirations 15. Pulse oximeter 95% on two liters.  GENERAL:  The patient was an elderly white woman in no distress. She was  alert, oriented, and appropriate though weak.  HEENT:  Normal.  NECK:  Without thyromegaly or adenopathy. Carotid pulses were palpable  bilaterally and without bruits.  CARDIOVASCULAR:  Normal S1 and S2. There was no S3, S4, murmur, rub, or  click. Cardiac rhythm was regular. No chest wall tenderness was noted.  LUNGS:  Clear.  ABDOMEN:  Soft and nontender. There was no mass, hepatosplenomegaly, bruit,  distention, rebound, guarding, or rigidity. Bowel sounds were normal.  BREASTS:  Not performed as they were not pertinent to the reason for acute  care hospitalization.  EXTREMITIES:  Without edema, deviation, or deformity. Radial and dorsalis  pedis pulses were palpable bilaterally.  NEUROLOGICAL:  Brief screening neurologic survey was unremarkable.   LABORATORY DATA:  The electrocardiogram revealed a rate of 38 beats per  minute with second degree AV block and 2:1 AV conduction. Nonspecific T-wave  flattening was noted. The chest radiograph had not yet been performed at the  time of this dictation. Blood work results were not yet available at the  time of this dictation.   IMPRESSION:  1.  Second degree arteriovenous block with 2:1 arteriovenous conduction and      symptomatic bradycardia:  The patient has experienced dizziness and      weakness but no syncope. There has been no chest pain.  2.  Hypertension.  3.  Dyslipidemia.  4.  Type 2 diabetes mellitus.  5.   History of congestive heart failure. The documentation and the etiology      for this are not known at this time.  6.  Depression.   PLAN:  1.  Telemetry.  2.  Serial cardiac enzymes.  3.  Echocardiogram.  4.  Further measures per Dr. Dani Gobble.      MSC/MEDQ  D:  05/12/2004  T:  05/12/2004  Job:  [id number]   cc:   Dani Gobble, MD  Fax: [phone]

## 2010-05-21 NOTE — H&P (Signed)
NAMETAREKA, [name] NO.:  [id number]   MEDICAL RECORD NO.:  [id number]            PATIENT TYPE:   LOCATION:                                 FACILITY:   PHYSICIAN:  Rodney A. Mortenson, M.D.DATE OF BIRTH:  Jan 25, 1923   DATE OF ADMISSION:  04/07/2005  DATE OF DISCHARGE:                                HISTORY & PHYSICAL   CHIEF COMPLAINT:  Left hip pain for about a year.   HISTORY OF PRESENT ILLNESS:  A 77 year old white female patient presented to  Dr. Chaney Malling with a one-year history of gradual onset progressive left hip  pain.  She has had problems with trochanteric bursitis in the past, but  shots had not been helping.  She has had no known injury or prior surgery to  her hip.  At this point, the pain is a constant sharp sensation over the  trochanter and groin with radiation all the way down her leg into the ankle.  Pain increases with walking and decreases if she rests, sits down, or uses  the Vicodin.  She cannot lie on the left side, but there are no paresthesias  or associated back pain.  She is able to put on her socks and shoes without  much difficulty.  She is currently taking Vicodin and Naprosyn for pain and  that provides some relief.  She is ambulating with the use of a walker.  She  has great difficulty with walking because of the pain.   ALLERGIES:  1.  CODEINE.  2.  SENSITIVITY TO TAPE AND BAND-AIDS.   CURRENT MEDICATIONS:  1.  Clorazepate 7.5 mg one tablet p.o. t.i.d.  2.  Ambien 10 mg one tablet p.o. q.h.s.  3.  Actos 45 mg one tablet p.o. q.a.m.  4.  Colace 100 mg one tablet p.o. q.p.m.  5.  Crestor 5 mg one tablet p.o. q.a.m.  6.  Isosorbide 30 mg one tablet p.o. q.a.m.  7.  Lisinopril 10 mg one tablet p.o. q.p.m.  8.  Nexium 40 mg one tablet p.o. q.a.m.  9.  Fluticasone 50 mcg two sprays in each nares b.i.d.  10. Naprosyn 220 mg one tablet p.o. b.i.d. last does March 31, 2005.  11. Bupropion SR 150 mg one tablet p.o. b.i.d.  12.  Aspirin 81 mg one tablet p.o. q.a.m.  13. Lasix 20 mg p.o. q.a.m.  14. Nitroglycerin 0.4 mg sublingually q.15 minutes p.r.n. chest pain.  15. Lactulose 10 g/15 mL one teaspoon p.o. p.r.n. constipation.  16. Meclizine 25 mg one tablet p.o. q.6h. p.r.n. vertigo.  17. Alprazolam 2 mg one tablet p.o. q.i.d. p.r.n. anxiety.  18. Mylanta 15 mL p.o. q.4h. p.r.n. nausea.  19. Vicodin one tablet p.o. q.4h. p.r.n. for pain.  20. Phenergan 25 mg p.o. q.4h. p.r.n. for nausea.  21. Flexeril 10 mg one tablet p.o. q.8h. p.r.n. for spasms.   PAST MEDICAL HISTORY:  1.  Type 2 diabetes mellitus.  2.  Hypertension.  3.  Anxiety disorder.  4.  Coronary artery disease with history of cardiac cath and pacemaker.  5.  Frequent nausea.  6.  Carotid artery stenosis.   PAST SURGICAL PROCEDURE:  1.  Appendectomy 1940s.  2.  Cholecystectomy 1950s.  3.  Removal of kidney stones twice in the 1970s.  4.  Lumbar surgery in the 1970s, twice.  5.  Total abdominal hysterectomy 1960s.  6.  Ankle surgeries done bilaterally in the 1990s and then Achilles tendon      repair on the right in the 1990s.  7.  Cardiac cath. September 2006.  8.  Cardiac pacemaker, May 2006.   She is she denies any complications from the above-mentioned procedures.   SOCIAL HISTORY:  She denies any history of cigarette smoking, alcohol use or  drug use.  She is a widow and has two children.  She lives in assisted  living at [facility] in [place].   FAMILY HISTORY:  Mother had a history of heart disease, heart attack,  hypertension, and diabetes.  Father had cancer.  She did have a brother who  had heart disease, high blood pressure and diabetes, same with her sister.  Her sister also had cancer.   REVIEW OF SYSTEMS:  She does have bilateral cataracts that have not been  treated yet.  She does wear glasses and contacts.  She does have some  shortness of breath with exertion.  She complains of occasional chest pain  which she feels is  indigestion.  She does have heart disease with a history  of stents and hypertension and a pacemaker.  She does complain of frequent  nausea and has had an ulcer in the past, some problems with nausea and  vomiting.  She does have chronic constipation.  She has had diabetes for  about five years, some easy bruising and ankle swelling.  She does have a  history of kidney stones.  She does have problems with her nurse.  All other  systems are negative and noncontributory.   PHYSICAL EXAMINATION:  Well-developed, well-nourished, mildly overweight  white female in no acute distress.  Walks with an antalgic gait and the use  of a walker.  Accompanied by her son.  Mood and affect are appropriate.  Height 5 feet 3 inches, weight 177 pounds, BMI is 30.5.  VITAL SIGNS:  Temperature 97.6 degrees Fahrenheit, pulse 68, respirations  16, and BP 122/46.  HEENT:  She has scattered moles or papules significantly about her neck, but  head is otherwise normocephalic, atraumatic without frontal or maxillary  sinus tenderness to palpation.  Conjunctivae pink.  Sclerae anicteric.  PERRLA.  EOMs intact.  No visible external ear deformities.  Hearing grossly  intact.  Unable to visualize either tympanic membrane due to a large amount  of cerumen in the canals.  Nose and nasal septum midline.  Nasal mucosa pink  and moist without exudates or polyps noted.  Buccal mucosa pink and moist.  She does have chipped teeth in the front, but no signs of any infection and  wears dentures.  Pharynx without erythema or exudates.  Tongue and uvula  midline.  Tongue without fasciculations and uvula rises equally with  phonation.  NECK:  No visible masses or lesions noted.  Trachea midline.  No palpable  lymphadenopathy or thyromegaly.  Carotids +2 bilaterally with significant  bruits, left worse than right.  Nontender to palpation along the cervical spine and good range of motion of the cervical spine.  CARDIOVASCULAR:  Heart  rate and rhythm regular.  S1-S2 present without rubs,  clicks or murmurs noted.  RESPIRATORY:  Respirations even  and unlabored.  Breath sounds clear to  auscultation bilaterally without rales or wheezes noted.  ABDOMEN:  Well-healed midline abdominal incision.  Bowel sounds present x4  quadrants.  Soft, nontender to palpation without hepatosplenomegaly nor CVA  tenderness.  Femoral pulses +1 bilaterally.  Nontender to palpation along  the vertebral column.  BREASTS/GENITOURINARY/RECTAL/PELVIC:  These exams deferred at this time.  MUSCULOSKELETAL:  No obvious deformities bilateral upper extremities with  full range of motion of these extremities without pain.  Radial pulses +2  bilaterally.  She has full range of motion of her knees, ankles and toes  bilaterally.  DP and PT pulses are +2.  Mild +1 lower extremity edema.  She  does have mild calf pain with palpation on the left leg, none on the right,  but negative Homans sign bilaterally.  The right hip has full extension and  flexion about 100 degrees with good internal external rotation without pain.  No pain with palpation about the hip.  Left hip has full extension, but  flexion only to 45 degrees.  She has pretty much no internal-external  rotation to the knee attempts at movements do cause pain.  She does complain  of pain with palpation in the left groin and over the greater trochanter.  NEUROLOGIC:  Alert and oriented x3.  Cranial nerves II-XII are grossly  intact.  Strength 5/5 bilateral upper and lower extremities.  Rapid  alternating movements intact.  Deep tendon reflexes 2+ bilateral upper and  lower extremities.   RADIOLOGIC FINDINGS:  X-rays taken of the left hip in February 2007 showed  minimal narrowing, but cyst formation noted about the acetabulum that seems  to have advanced from previous films.   IMPRESSION:  1.  Osteoarthritis, left hip.  2.  Diabetes mellitus.  3.  Hypertension.  4.  Coronary artery disease with  history of cardiac cath and cardiac      pacemaker.  5.  Anxiety disorder.  6.  Carotid artery stenosis.  7.  Cataracts.   PLAN:  Ms. Deadwyler will be admitted to [facility] on April 07, 2005  where she will undergo a left total hip arthroplasty by Dr. Rinaldo Ratel.  She will undergo all the routine preoperative laboratory tests  and studies prior to this procedure.  If we have any medical issues while  she is hospitalized we will consult Dr. Shirlee Latch.      Legrand Pitts Duffy, P.A.    ______________________________  Lenard Galloway Chaney Malling, M.D.    KED/MEDQ  D:  03/28/2005  T:  03/29/2005  Job:  [id number]

## 2010-05-21 NOTE — Discharge Summary (Signed)
[name], [name] NO.:  [id number]   MEDICAL RECORD NO.:  [id number]          PATIENT TYPE:  INP   LOCATION:  5004                         FACILITY:  [facility]   PHYSICIAN:  [name], M.D.DATE OF BIRTH:  Sep 18, 1923   DATE OF ADMISSION:  06/28/2005  DATE OF DISCHARGE:  07/04/2005                                 DISCHARGE SUMMARY   ADMITTING DIAGNOSES:  1.  End-stage osteoarthritis left hip.  2.  Diabetes mellitus.  3.  Hypertension.  4.  Anxiety.  5.  Coronary artery disease.  6.  Cardiac pacemaker.  7.  Chronic constipation.  8.  History of recent carotid endarterectomies.  9.  History of bladder infection.   DISCHARGE DIAGNOSES:  1.  End-stage osteoarthritis left hip status post left total hip      arthroplasty.  2.  Hypotension, now resolved.  3.  Acute blood loss anemia secondary to surgery.  4.  Rhabdomyolysis, mild, now improving.  5.  Hypoxia, now improved.  6.  Chronic constipation.  7.  Diabetes mellitus.  8.  Hypertension.  9.  Anxiety disorder.  10. Coronary artery disease.  11. Cardiac pacemaker.  12. Chronic constipation.  13. History of recent carotid endarterectomy.  14. History of bladder infection.   SURGICAL PROCEDURE:  On June 28, 2005 [name] underwent a left total hip  arthroplasty by Dr. Rinaldo [name], assisted by Dr. Claude [name]. [name] and  [name], P.A.-C.  [name] had a DePuy Pinnacle 100 series acetabular cup size  56 mm with an apex hole eliminator and a Pinnacle Marathon acetabular liner  +4 10 degree 36 mm inner diameter 56 mm outer diameter, an AML large stature  160 mm length 45 mm offset size 13.5 femoral stem with an articules metal on  metal femoral head, 36 mm +5 neck, 12/14 cone.   COMPLICATIONS:  None.   CONSULTS:  1.  Case management consult June 29, 2005 in addition to occupational      therapy and physical therapy consult  2.  [name] Internal Medicine consult June 30, 2005   HISTORY OF  PRESENT ILLNESS:  A 77 year old white female patient presented to  [name] with a history of problems with [name] left hip.  It has been a  constant sharp sensation in the left groin with radiation down to [name] ankle.  Pain increases with walking and decreases with rest and sitting.  [name] is  requiring narcotics for pain.  [name] was scheduled for surgery earlier in the  year, but was found to have a carotid bruit that needed surgical correction  first.  [name] has failed conservative treatment and x-rays show end-stage  arthritic changes of that left hip.  Because of that [name] is presenting for a  left hip replacement.   [name] COURSE:  [name] tolerated [name] surgical procedure well without  immediate postoperative complications.  [name] was transferred to 5000.  On  postoperative day one Tmax 99.3, pulse 102, blood pressure 103/57.  Hemoglobin was 9.8, hematocrit 28.4.  Leg was neurovascularly intact.  [name]  was started on therapy per protocol  and oxygen was weaned.  [name] was started  on Arixtra for DVT prophylaxis.   On postoperative day two hemoglobin had dropped to 7.9 with hematocrit 22.7.  [name] was subsequently transfused with 2 units of packed red blood cells.  Temperature was 102.2, blood pressure 105/52.  Incision was well  approximated with staples, minimal drainage.  Foley was maintained at that  time for I&Os and [name] was continued on therapy.  [name] had some difficulty  with continued hypotension that day so an internal medicine consult was  obtained with [name] hospitalists and they followed [name] throughout [name]  hospitalization.   On postoperative day three Tmax 101.  Blood pressure had improved to 121/60.  Hemoglobin 11.4, hematocrit 32.5.  [name] making slow progress with therapy.  Search was done for SNF treatment postoperative.  Medicine felt [name] did have  a mild case of rhabdomyolysis and that was treated with IV fluids.   [name] continued to do well over the next several days.  [name]  did have one  episode of hypotension and some hypoxia which was evaluated also with a CT  scan due to an elevated D-dimer.  That was negative for PE.  [name] continued  to make slow progress with therapy and was felt [name] would be a good  candidate for a skilled facility.  Skilled facility placement has been  obtained for June 2 and [name] will be discharged there later today.   DISCHARGE INSTRUCTIONS:   DIET:  [name] can continue [name] pre [name] medications except no aspirin or  Aleve while on Arixtra.   HOME MEDICATIONS:  1.  Flexeril 5 mg p.o. q.8h. p.r.n. for spasms.  2.  Actos 45 mg p.o. q.a.m.  3.  Colace 100 mg two tablets p.o. q.h.s.  4.  Crestor 5 mg p.o. q.a.m.  5.  Isosorbide 30 mg p.o. q.a.m.  6.  Lisinopril 10 mg p.o. q.h.s.  7.  Nexium 40 mg p.o. q.a.m.  8.  Bisacodyl 5 mg two tablets p.o. q.a.m. p.r.n. constipation.  9.  Alprazolam 2 mg p.o. q.4h. p.r.n. for anxiety.  10. Vicodin 5/325 mg one to two tablets p.o. q.4h. p.r.n. for pain.  11. Bupropion 150 mg p.o. b.i.d.  12. Clorazepate 7.5 mg p.o. t.i.d.  13. Naproxen 220 mg two tablets p.o. q.a.m.  [name] is not to take this      anymore.  14. Ambien 10 mg p.o. q.h.s. p.r.n. insomnia.  15. Fluticasone 50 mcg inhaled two sprays inhaled b.i.d.  16. Lasix 20 mg p.o. q.a.m.  17. Nitroglycerin 0.4 mg sublingual p.r.n. chest pain.  18. Lactulose 10 g one tablespoon p.o. q.a.m. p.r.n.  19. Antacid suspension one tablespoon p.o. q.4h. p.r.n. GI upset.  20. Meclizine 25 mg p.o. t.i.d. p.r.n. dizziness.  21. Phenergan 25 mg p.o. q.4h. p.r.n. nausea.   ADDITIONAL MEDICATIONS AT THIS TIME:  Arixtra 2.5 mg subcutaneous every 9  a.m. with the last dose to be on July 07, 2005.  [name] may resume [name] baby  aspirin at that time.   ACTIVITY:  [name] can be out of bed 50% weightbearing on the left leg with use  of the walker.  [name] is to have the knee immobilizer to [name] left leg when [name] is in bed for six weeks.  [name] is to have home health PT per  rehabilitation  protocols.   [name] is to maintain posterior hip precautions for at least six weeks.   WOUND CARE:  Please clean the left hip incision with  Betadine daily and  apply a dry dressing.  [name] could shower after no drainage from the wound for  two days.  Please notify [name] of temperature greater than 101.5,  chills, pain unrelieved by pain medications, or foul smelling drainage from  the wound.   FOLLOW-UP:  [name] needs to follow up with [name] in our office on about  July 11, 2005 and you need to call [phone] for that appointment.  [name] will  have [name] staples removed at that time.   LABORATORY DATA:  Chest x-ray done on May 02, 2005 showed no acute  cardiopulmonary disease.  X-ray taken of [name] left hip on June 26 showed good  AP and lateral positron of [name] left hip.  Chest x-ray done on June 29 showed  low lung volumes and linear opacities at the lung base likely related to  subsegmental atelectasis.  CT angiography of the chest per PE protocol  showed borderline cardiomegaly, no pulmonary emboli or acute abnormality.   Hemoglobin and hematocrit have ranged from 10.9 and 30.3 on June 20 to a low  of 7.9 and 22.7 on the 28th to 9.9 and 28.8 on the 1st.  White count done  within normal limits.  Platelets were from 247 on the 20th to a low of 134  on the 28th to 177 on the 1st.   D-dimer on June 30 was 2.73.   Sodium dropped to a low of 131 on the 27th.  Potassium dropped to a low of  3.4 on the 1st.  Glucose ranged from 122 on the 20th to a low of 101 on the  1st.  BUN dropped to a low of 5 on July 1.   CK was elevated at 1515 on June 29 with a CK-MB of 8.4, index of 0.6, and  troponin 0.02.  I then dropped slowly to June 30 CK was 511.   Urinalysis on June 29 showed cloudy yellow urine, small hemoglobin, 3-6  white and red cells, and hyalin casts.  All other laboratory studies were  within normal limits.      Legrand Pitts Duffy, P.A.     ______________________________  Leslie Galloway [name], M.D.    KED/MEDQ  D:  07/04/2005  T:  07/04/2005  Job:  [id number]   cc:   Donnel Saxon  Fax: [phone]

## 2010-05-21 NOTE — Op Note (Signed)
[name], [name] NO.:  [id number]   MEDICAL RECORD NO.:  [id number]          PATIENT TYPE:  INP   LOCATION:  2899                         FACILITY:  [facility]   PHYSICIAN:  Lenard Galloway. Mortenson, M.D.DATE OF BIRTH:  Jan 10, 1923   DATE OF PROCEDURE:  06/28/2005  DATE OF DISCHARGE:                                 OPERATIVE REPORT   PREOPERATIVE DIAGNOSIS:  Severe osteoarthritis, left hip.   POSTOPERATIVE DIAGNOSIS:  Severe osteoarthritis, left hip.   OPERATION:  Total hip replacement on the left using a DePuy Pinnacle 100  Series acetabular cup, outside diameter of 56 mm with an apex hole  eliminator and a Pinnacle Marathon acetabular liner +4 with 10-degree lip,  36-mm inside diameter and an AML large-stature 13.5-mm size with a 36-mm  head and a +5 neck.   SURGEON:  Lenard Galloway. Chaney Malling, M.D.   ASSISTANTS:  1.  Claude Manges. Cleophas Dunker, M.D.  2.  Richardean Canal, P.A.   ANESTHESIA:  General.   PROCEDURE:  After satisfactory anesthesia, the patient was placed on  the  operating table in the full lateral position with the left hip up.  The left  lower extremity and hip were prepped with DuraPrep and draped out in the  usual manner.  A Vi-Drape was placed over the opposite side.  A southern  incision was made over the proximal femur.  Skin edges were well-retracted.  Bleeders were coagulated.  The tensor fascia lata was then split and [place] retractors were put in place.  The external hip rotators were  identified and isolated.  A suture was placed at the piriformis and this was  released and retracted through the midline.  A vertical incision was made  through the capsule and the hip was then dislocated.  Using a power saw, the  neck and head were amputated at the appropriate length and angle.  Good  exposure of the proximal femur was then achieved.  A hole was placed in the  area of foramen ovale and a canal finder was passed down the femoral canal.  There was  good capture.  A series of fully fluted reamers were used and this  was reamed out to a 13 mm.  A series of broaches were then passed down the  femoral canal and this was sized up to a 13.5 standard-size broach and this  seated very nicely and filled up the calcar.  There was excellent stability  and positioning.  This was removed.  Throughout the procedure, the hip was  irrigated with a copious amount of saline solution.   Attention was turned to the acetabulum.  A Cobra retractor was placed around  the rim of the acetabulum.  The labrum was removed.  A series of cheese-  cutter reamers were then used nd this was reamed up to a 55-mm acetabular  cup.  The 54-trial seated very nicely in the base.  There was good bleeding  bone throughout.  Again, the hip was irrigated with saline solution.  The 56  Pinnacle 100 Series cup was then selected and driven down into  the  acetabulum; there was an excellent rim fit all the way around and this was  extremely stable.  A 56-mm trial poly was inserted with a broach inserted  down the femoral canal and a series of different necks were used.  The +5  neck with a 36-mm head was extremely stable.  There was good range of motion  and leg lengths were equal.  This was then dislocated and all the trial  components were removed.  The apex hole eliminator was inserted.  The  Pinnacle Marathon acetabular liner was then snap-fitted in place and this  locked down very nicely.  The final large-stature AML femoral component was  driven down and seated very nicely in the calcar.  The +5 neck with the 36-  mm head was tapped onto the [facility] taper and the hip was reduced.  Again,  there was excellent stability in all positions.  The posterior hip capsule  was then closed with interrupted heavy Tycron sutures.  The tensor fascia  lata was closed with Tycron.  Vicryl was used to close the subcutaneous  tissues in multiple layers.  Stainless steel staples were used to  close the  skin.  Sterile dressings were applied and the patient returned to the  recovery room in excellent condition.  Technically, this went extremely  well.  I was extremely pleased with the surgical results.           ______________________________  Lenard Galloway Chaney Malling, M.D.     RAM/MEDQ  D:  06/28/2005  T:  06/28/2005  Job:  [id number]

## 2010-05-21 NOTE — Cardiovascular Report (Signed)
[name], [name] NO.:  [id number]   MEDICAL RECORD NO.:  [id number]          PATIENT TYPE:  INP   LOCATION:  2114                         FACILITY:  [facility]   PHYSICIAN:  Janeece Riggers. Severiano Gilbert, M.D.    DATE OF BIRTH:  [birth year]   DATE OF PROCEDURE:  05/13/2004  DATE OF DISCHARGE:                              CARDIAC CATHETERIZATION   PROCEDURE PERFORMED:  1.  Dual-chamber pacemaker implantation.  2.  Fluoroscopy for implantation.   PREPROCEDURE DIAGNOSIS:  Symptomatic bradycardia with high-degree second  degree AV block.   POSTPROCEDURE DIAGNOSIS:  Symptomatic bradycardia with high-degree second  degree AV block.   OPERATOR:  Mark E. Severiano Gilbert, M.D.   COMPLICATIONS:  None.   ESTIMATED BLOOD LOSS:  Less than 50 cc.   DRUGS USED:  Ancef, 1% lidocaine, fentanyl, Versed.   PROCEDURE IN DETAIL:  The patient was brought to the cardiac catheterization  laboratory in a fasting state.  The patient's left pectoral region was  prepped and draped in usual manner.  Ancef 1 g IV infused with needle into  prior skin incision for antimicrobial prophylaxis.  Local anesthesia  obtained by subcutaneous injection of 1% lidocaine.  Conscious sedation  obtained with intermittent injections of midazolam and fentanyl.  A 5 cm  incision line, 2 cm inferior to the left clavicle and reaching the left  deltopectoral groove, was made to the level of the prepectoral fascia using  a combination of blunt and sharp dissection.  Inferior to the incision line,  an appropriately sized pacemaker pocket was fashioned by blunt dissection,  was copiously irrigated with antibiotic-containing solution, and  electrocautery was used to obtain adequate hemostasis.  Using the anterior  first rib technique and a femoral needle, two 0.035 inch guide wires were  easily introduced into the left axillary vein and placed within the vena  cava.  Over these wires two 7-French safe sheath systems were easily  introduced.  Under fluoroscopic guidance a St. Jude Model [id number] active  fixation atrial pacing lead was advanced to the level of the right atrium,  safe sheath was removed, and using a curved stylet the atrium was mapped.  Multiple mapping sites within the right atrial appendage (more superior and  anterior) failed to reveal good thresholds or sensing activity, perhaps  secondary to fibrosis.  After extensively mapping a lower part of the right  atrial appendage near the lateral wall of the right atrium, demonstrated  excellent pace capture thresholds and sensitivity characteristics.  The lead  was actively affixed carefully, visualizing a lead extrusion and over  corking to minimize risk of perforation.  After determining adequate  electronic characteristics, we monitored the patient for about 25 min so  that there was no evidence of perforation.  The patient had no symptoms to  suggest perforation.   Next, a St. Jude Model [id number] passive fixation ventricular lead was advanced  fluoroscopically; first into the right ventricular outflow tract, the safe  sheath system was removed, and then using a straight stylet positioned  within the right ventricular apex.  After demonstrating adequate electronic  characteristics, the  lead was sutured in place using 0 silk ligatures.   Next, the atrial and ventricular pacing leads were connected to a Office manager (Ser. No. [id number]) dual-chamber rate responsive mode switching  pacemaker.  Then the pacemaker lead combination placed within the preformed  pacemaker pocket without difficulty.  The pocket was again flushed with  antibiotic-containing solution.  The pocket was closed in two layers with 2-  0 and 4-0 Vicryl.  The skin incision was reinforced with Steri-Strips, and a  bulky pressure dressing applied.   The patient tolerated the procedure well and returned to the holding area in  stable hemodynamic condition.  Electronic characteristics  of the atrial lead  demonstrated the P-wave of 1.7 and 2.0 V, impedance of 588 ohms and capture  threshold of 0.7 V at 0.5 msec.  Ventricular lead demonstrated capture  threshold of 0.7 V at 0.5 msec, impedance 707 ohms, and average R-wave  between 7-8 mV.  Device was programmed in the dual-chamber nonrate-  responsive mode.  Mode switching was programmed on.  Outputs in the A and B  were both 5 V at 0.5 msec.      MEP/MEDQ  D:  05/13/2004  T:  05/13/2004  Job:  [id number]

## 2010-05-21 NOTE — Cardiovascular Report (Signed)
NAMEAVAH, BASHOR NO.:  [id number]   MEDICAL RECORD NO.:  [id number]          PATIENT TYPE:  INP   LOCATION:  6529                         FACILITY:  [facility]   PHYSICIAN:  Cristy Hilts. Jacinto Halim, MD       DATE OF BIRTH:  01/26/1923   DATE OF PROCEDURE:  09/01/2004  DATE OF DISCHARGE:                              CARDIAC CATHETERIZATION   CARDIOLOGIST:  Darlin Priestly, MD   PRIMARY CARE PHYSICIAN:  Dr.  Judith Blonder.   PROCEDURES:  1.  Left ventriculography.  2.  Selective left and right coronary arteriography.  3.  Abdominal aortogram.  4.  Right femoral angiography with closure of the right femoral arterial      access with StarClose.   INDICATIONS:  Mrs. Kestner is a 77 year old female with history of  hypertension, hyperlipidemia, diabetes mellitus who is admitted to the  hospital with chest pain and shortness of breath.  Given her multiple  cardiac risk factors, she was brought to the cardiac catheterization lab to  evaluate her coronary artery.  Myocardial infarction was ruled out.   HEMODYNAMIC DATA:  The left ventricular pressure was is 124/6 with an end  diastolic pressure of 14 mmHg.  Aortic pressure of 127/55 with a mean of 85  mmHg.  There was no pressure gradient across the aortic valve.   The left ventricle systolic function was normal and ejection fraction was  estimated at 65%.  There was no significant mitral regurgitation.   The right coronary artery is a large caliber vessel, dominant, superior,  gives origin to a moderate sized PDA and a large PLA.  It is smooth and  normal.   Left main has a small to moderate caliber vessel which is normal.   Circumflex is a moderate caliber vessel in the mid segment just before the  bifurcation at the OM1 and OM2.  There is a 40-50% stenosis.   Ramus intermedius is a moderate caliber vessel measuring about 2.0 to 2.5  mm.  This had a 60% mid stenosis.   LAD is a moderate to large caliber vessel in the  proximal segment.  It has  mild to moderate diffuse disease throughout.  The mid segment has a 40%  eccentric stenosis, and the mid to distal segment give origin to a moderate  sized to moderate large size diagonal 3, there is a 70% stenosis.  The  diagonal 1 is very tiny, less than 1.0 mm, and the diagonal 2 is also tiny,  measuring most at 1.5 to 1.55 mm, and the diagonal 3 is the larger vessel  measuring about 1.75 to 2.0 mm.  This lady measures approximately 2.5 mm.  The stenosis is after the origin of the diagonals.   Abdominal aortogram:  Abdominal aortogram revealed the presence of two renal  arteries, on either side.  Aortoiliac bifurcation was widely patent.   IMPRESSION:  1.  Diffuse coronary artery disease as dictated above.  The circumflex has a      mid 40-50% stenosis.  The obtuse margins are 2.0 mm vessels.  The ramus  intermedius has a 60% mid stenosis.  It is a 2.0-2.5 mm vessel.  The LAD      has a mid 40% stenosis.  The mid to distal segment after the origin of a      moderate to large diagonal 3 has a 70% stenosis.  The diagonal 1 was      very tiny, less than 1 mm.  Diagonal 2 is very small, again, measuring      1.5 x 1.75 mm.  Both of these have 80% ostial stenosis.   RECOMMENDATIONS:  1.  The patient has diffuse small vessel disease along with a large vessel      70% mid LAD stenosis.  However, the chest pain and shortness of breath      is probably secondary to chronic diastolic heart failure secondary to      diabetes, hypertension and also a light coronary disease.  The mid LAD      stenosis appears to be significant.  She will be a candidate for      angioplasty.  However, I would like to document ischemia by obtaining an      outpatient Cardiolite stress test.   1.  Would agree with having started on beta blockers with ACE inhibitors and      Imdur and add a very low dose of a lipid lowering therapy, due to the      fact that she is intolerant to  statins.  She can be discharged home      either today or tomorrow and outpatient evaluation can be performed.   1.  A total of 80 cc of contrast was required for diagnostic angiography.   TECHNIQUES OF PROCEDURES:  Under the usual sterile technique, using a 6-  French right femoral artery access and a 5-French luminal venous access,  left heart catheterization was performed throughout arterial access.  A 6-  [place] multipurpose B2 catheter was advanced into the ascending aorta with a  0.025 J wire.  The catheter was entered into the left ventricle.  The left  ventricular pressures were monitored.  Hand contrast in the left ventricle  was performed both in the LAO and RAO projections.  The catheter was  flushed, pulled back in the ascending aorta and pressure gradient across the  aortic valve was monitored.  Right coronary artery was selectively engaged  and angiography was performed.  Similar fashion, left main coronary artery  was selectively engaged and angiography was performed.  Catheter was then  pulled back to the abdominal area and abdominal aortogram was performed.  Right femoral angiography was performed through the arterial access sheath,  and the access was closed with StarClose with excellent hemostasis obtained.  Patient tolerated the procedure well.  No immediate complication noted.      Cristy Hilts. Jacinto Halim, MD  Electronically Signed     JRG/MEDQ  D:  09/01/2004  T:  09/01/2004  Job:  [id number]   cc:   Darlin Priestly, MD  [address], [address]  [place]  [place] [address]  Fax: [phone]

## 2010-05-21 NOTE — Discharge Summary (Signed)
NAMESTACEYANN, [name] NO.:  [id number]   MEDICAL RECORD NO.:  [id number]          PATIENT TYPE:  INP   LOCATION:  3734                         FACILITY:  [facility]   PHYSICIAN:  Cristy Hilts. Jacinto Halim, MD       DATE OF BIRTH:  09-Mar-1923   DATE OF ADMISSION:  05/12/2004  DATE OF DISCHARGE:                                 DISCHARGE SUMMARY   HISTORY OF PRESENT ILLNESS:  Mr. [name] came into the hospital on  May 12, 2004, transferred from [facility] with a four day history of  dizziness and generalized fatigue.  She had no chest pain, tightness,  heaviness, pressure, squeezing, nor was there any syncope or near syncope.  She was found to be bradycardic in the 30's.  She was in 2:1 block.  The  following morning, she was seen by Dr. Jacinto Halim.  It was decided she would need  to undergo pacemaker placement.  She was assessed by Dr. Severiano Gilbert.  She had a  DDD St. Jude variety ADXXLDR and DDDR model #5356 placed.  The following  morning, May 14, 2004, she was in stable condition.  Her dressing was  removed.  The Steri-Strips were intact.  Her site was without any  significant hematoma, bruising or swelling.  Her blood pressure was 122/62,  heart rate 72, respirations 20, temperature 98.7.  SAO2 was 98% on room air.  Up to now, she has only walked in her room.  We will have her walk in the  hall.  I will review her chest x-ray, and she will go back to assisted  living.  She has an office appointment to see Dr. Severiano Gilbert for wound check on  May 17 at 10:30.  I made an appointment to see Dr. Jacinto Halim, but I realized  that she has been seen in our office by Dr. Jenne Campus.  Thus, she will be  followed by Dr. Jenne Campus for any other cardiac needs.   DISCHARGE MEDICATIONS:  1.  Actos 45 mg in the morning.  2.  Wellbutrin SR 150 mg daily.  3.  Tranxene 7.5 mg t.i.d.  4.  Ambien 10 mg q.h.s.  5.  Protonix 80 mg one time per day.  6.  Tylenol for discomfort or Darvocet.   DISCHARGE  INSTRUCTIONS:  She should do no lifting, strenuous activity,  reaching, pushing, pulling or stretching with her left arm.  She should keep  the posterior side dry x4 days, and she may wash it gently with soap and  water and pat it dry.  She is to follow up on her wound check on May 17 at  10:30.  She will have an appointment also to see Dr. Jenne Campus at a later date  in May.   DISCHARGE LABORATORIES:  On May 13, 2004, hemoglobin 12.7, hematocrit 37.4,  WBC 5.7, platelets 171,000.  Sodium 140, potassium 4.4, chloride 108, CO2  23, BUN 20, creatinine 1.0, glucose 114.  CK MB's were negative x2.  Her  third troponin was elevated at 0.09 on May 13, 2004, at 3:15.  BNP was 166.  TSH  was 2.866.  INR was 1.0.  Her urine was without any pathology.  Chest x-  ray is pending at the time of discharge status post pacemaker insertion.  Her pre-chest x-ray showed some left lower lobe atelectasis.   DISCHARGE DIAGNOSES:  1.  Symptomatic bradycardia with 2-1 AV block with right bundle branch block      in the LAD.  2.  Status post PTVDP secondary to #1, performed by Dr. Severiano Gilbert.  3.  Non-insulin-dependent diabetes mellitus.  4.  Gastroesophageal reflux disease.  5.  Hypertension.  Records show a previous history of hypertension.  This is      not a problem now, and she is not on any medication.  6.  Depression on Wellbutrin.      BB/MEDQ  D:  05/14/2004  T:  05/14/2004  Job:  [id number]   cc:   Judith Blonder, M.D.  [facility]

## 2010-05-26 ENCOUNTER — Telehealth: Payer: Self-pay | Admitting: Family Medicine

## 2010-05-26 NOTE — Telephone Encounter (Signed)
LMOM for daughter to call back.

## 2010-05-26 NOTE — Telephone Encounter (Signed)
[employer] called in [employer]) and she said pt is requesting to go back to a regular diet.  Please advise. Plan:  Routed to Dr. Cathey Endow for order Jarvis Newcomer, LPN Domingo Dimes

## 2010-05-26 NOTE — Telephone Encounter (Signed)
SHE IS DIABETIC AND HAS HEART DZ, SO BEST TO NOT CHANGE UNLESS OK'D BY PT AND FAMILY AND THEY UNDERSTAND THE RISK OF CHANGING.

## 2010-05-26 NOTE — Telephone Encounter (Signed)
Pts daughter called back and said that [facility] had called and given wrong information.  She only wants not a change in the diet, but rather it needs a new order stating change from mechanical grind to just cutting up the food.  She heard the in charge person tell the med tech this order and the daughter said they obviously got the order messed up. Please inform daughter that clarification made. Plan:  Routed to Payton Spark, CMA and Dr. Cathey Endow. Jarvis Newcomer, LPN Domingo Dimes

## 2010-06-15 ENCOUNTER — Telehealth: Payer: Self-pay | Admitting: Family Medicine

## 2010-06-15 NOTE — Telephone Encounter (Signed)
Pls let pt and daughter know that Zahniya's last set of labs look great.

## 2010-06-16 NOTE — Telephone Encounter (Signed)
LMOM informing daughter of the above

## 2010-06-24 ENCOUNTER — Encounter: Payer: Self-pay | Admitting: Family Medicine

## 2010-07-26 ENCOUNTER — Encounter: Payer: Self-pay | Admitting: Family Medicine

## 2010-07-26 DIAGNOSIS — N390 Urinary tract infection, site not specified: Secondary | ICD-10-CM | POA: Insufficient documentation

## 2010-07-30 ENCOUNTER — Ambulatory Visit (INDEPENDENT_AMBULATORY_CARE_PROVIDER_SITE_OTHER): Payer: Medicare Other | Admitting: Family Medicine

## 2010-07-30 ENCOUNTER — Encounter: Payer: Self-pay | Admitting: Family Medicine

## 2010-07-30 VITALS — BP 115/60 | HR 60 | Wt 190.0 lb

## 2010-07-30 DIAGNOSIS — E119 Type 2 diabetes mellitus without complications: Secondary | ICD-10-CM

## 2010-07-30 DIAGNOSIS — I10 Essential (primary) hypertension: Secondary | ICD-10-CM

## 2010-07-30 LAB — POCT GLYCOSYLATED HEMOGLOBIN (HGB A1C): Hemoglobin A1C: 6.5

## 2010-07-30 IMAGING — CR DG ANKLE COMPLETE 3+V*R*
3 series · 3 of 3 positions shown · non-contrast
Comparison: 07/23/2007

CLINICAL DATA: Recurrent fall.  Pain and swelling.  History of
surgery 25 years ago.

RIGHT ANKLE - COMPLETE 3+ VIEW

[view not recorded (1 of 3)]
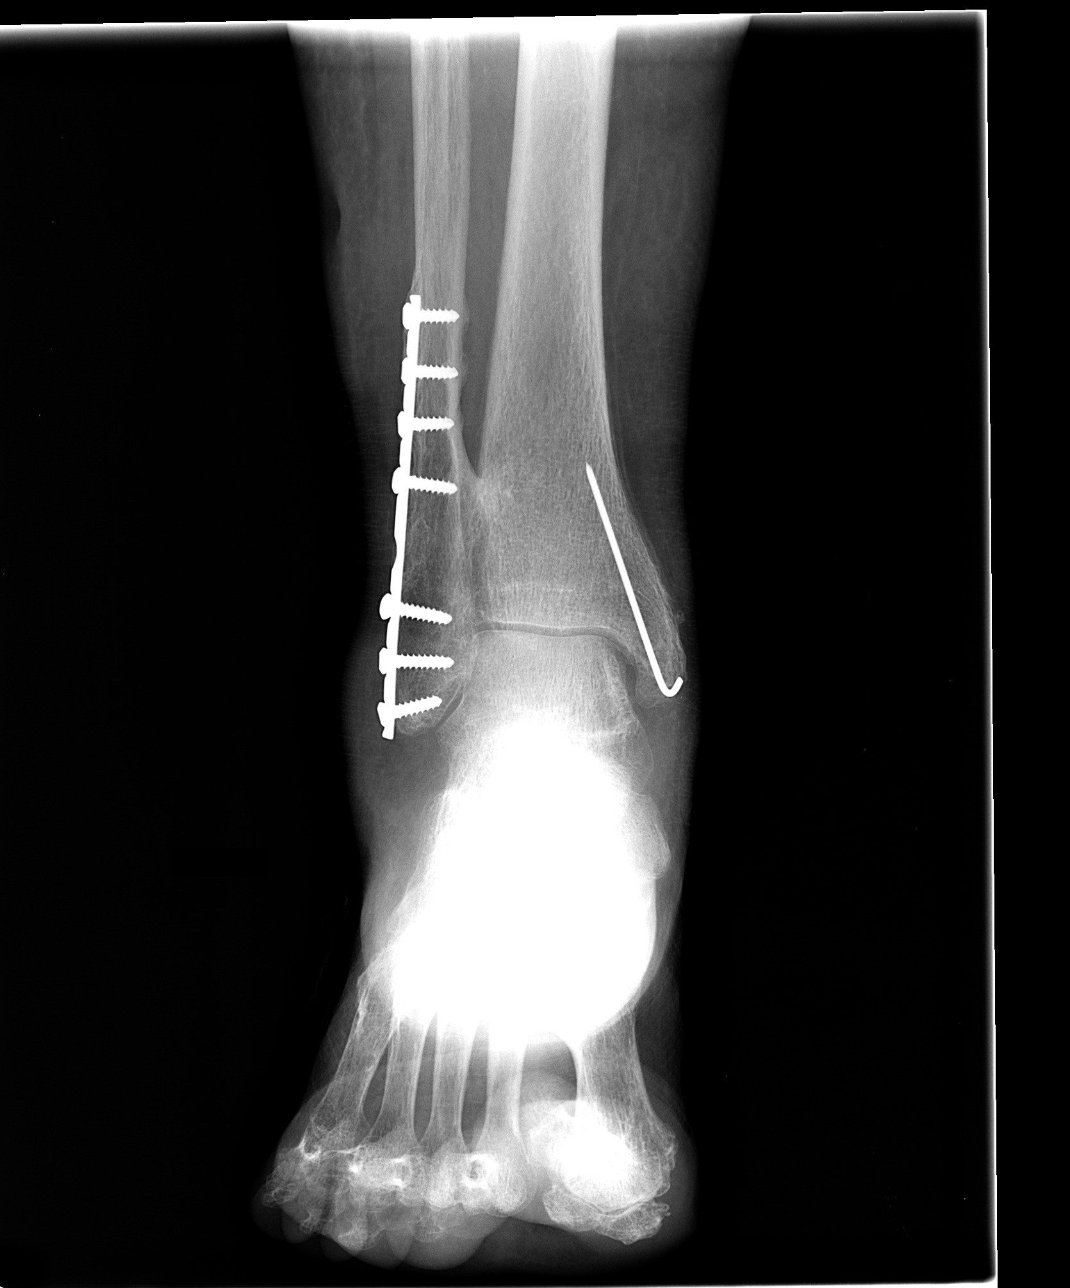

[view not recorded (2 of 3)]
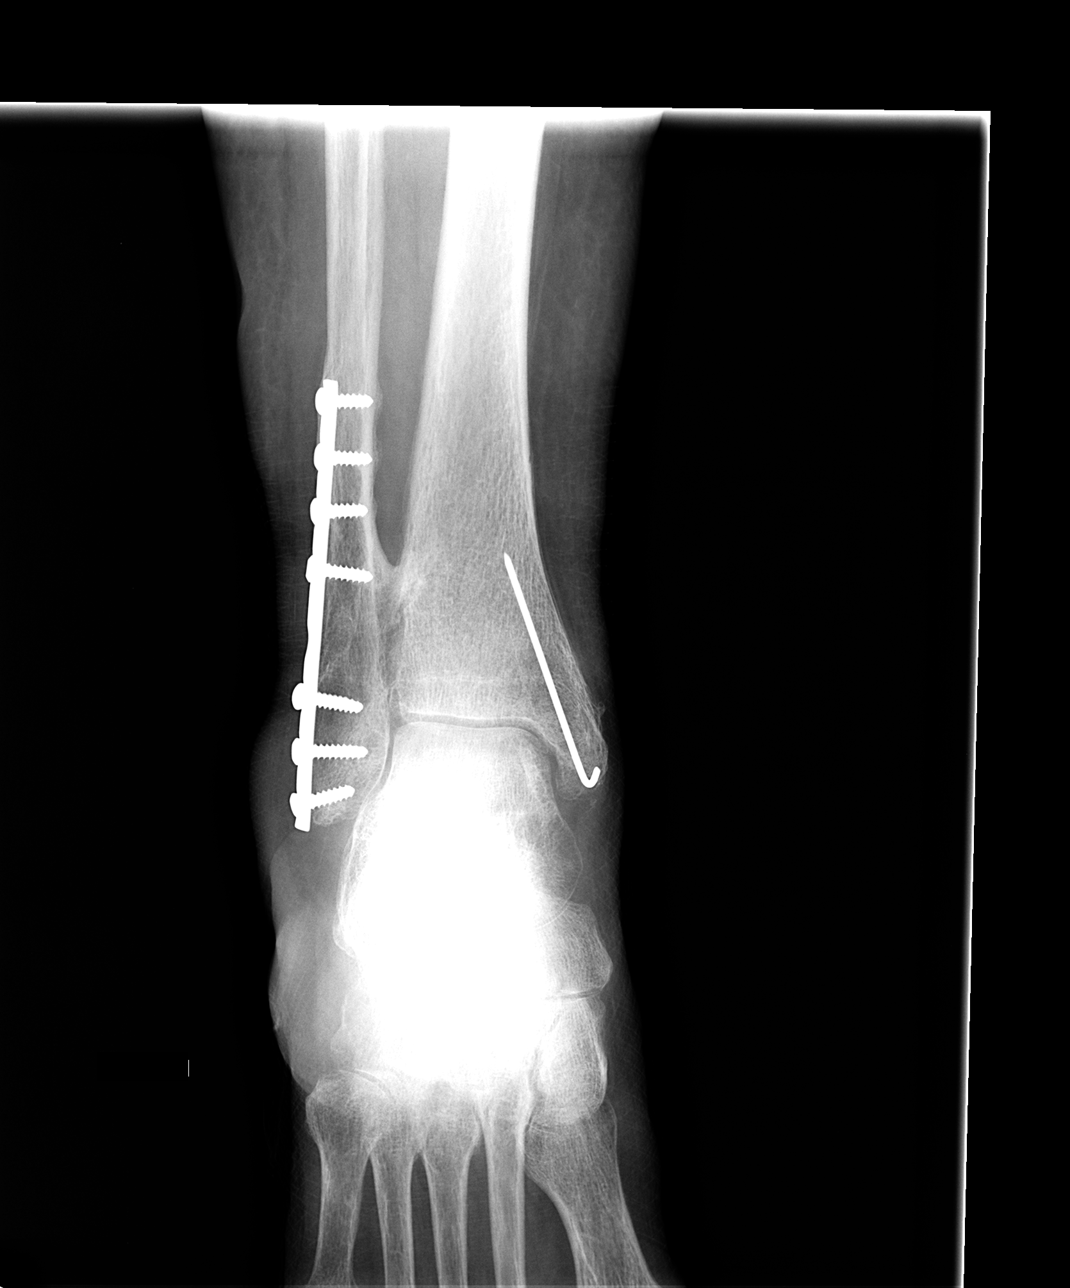

[view not recorded (3 of 3)]
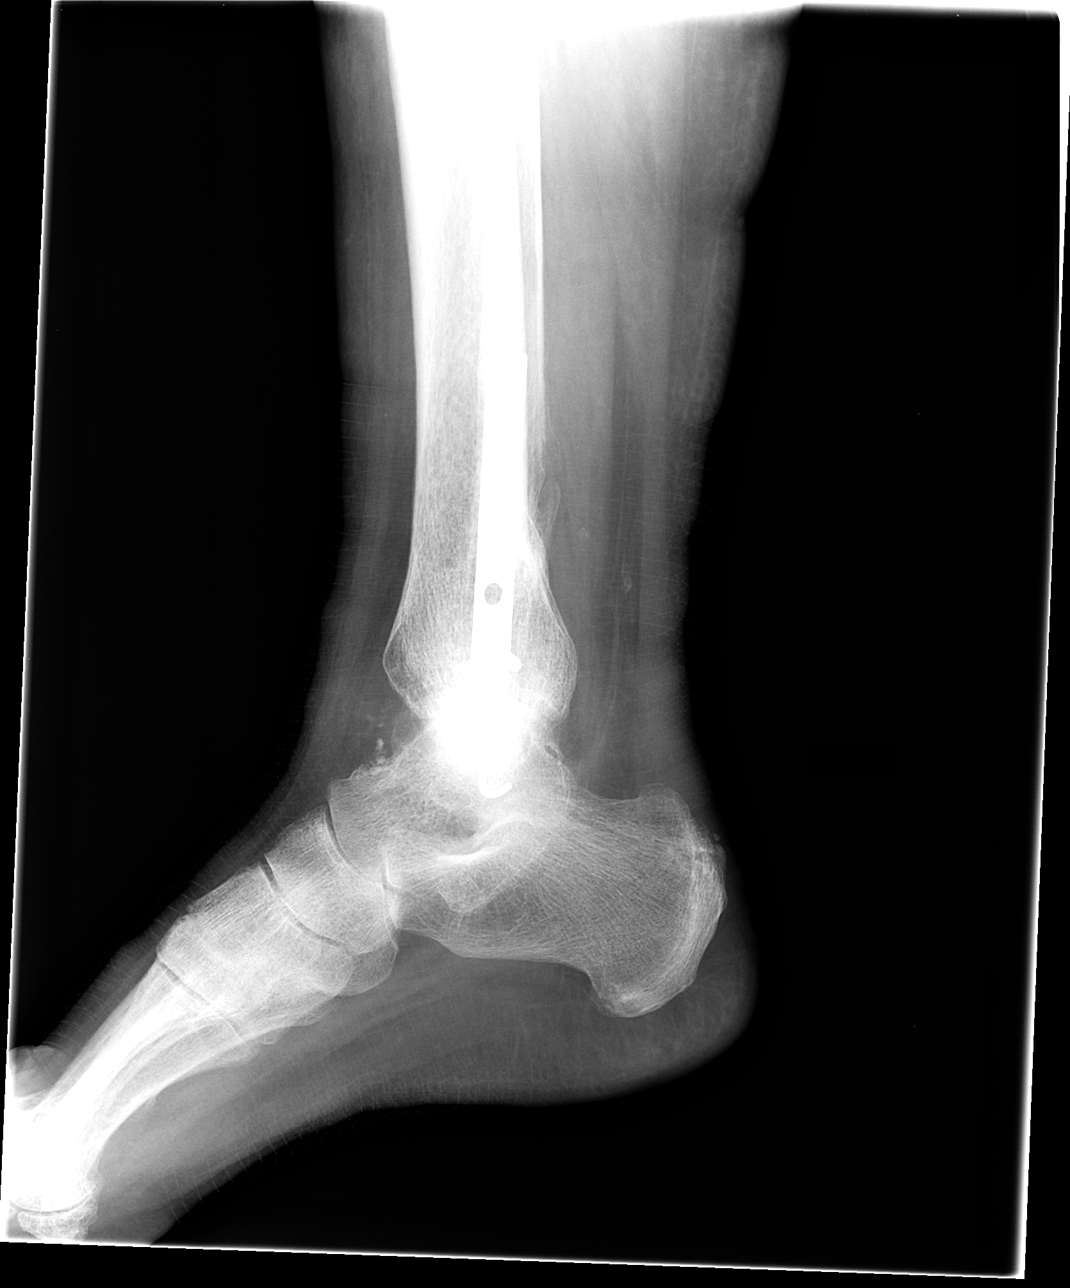

[3 of 3 positions shown; findings below may reference images not displayed]

FINDINGS: patient status post plate screw fixation of the distal
fibula.  Pin fixation of the medial malleolus.

Mild disuse osteopenia.  Mild lateral greater than medial soft
tissue swelling which is similar to on the prior exam.  No acute
hardware complication.  The talar dome and base of fifth metatarsal
are intact.  Moderate osteoarthritis including about the tibiotalar
joint.  Small Achilles spur.
IMPRESSION: 1. No significant change since the prior exam.
2.  Postsurgical changes with moderate osteoarthritis and mild
diffuse soft tissue swelling but no acute hardware complication.

## 2010-07-30 MED ORDER — AMBULATORY NON FORMULARY MEDICATION: Status: DC

## 2010-07-30 NOTE — Progress Notes (Signed)
  Subjective:    Patient ID: [name], female    DOB: [birth year], 77 y.o.   MRN: [id number]  HPI  77 yo WF presents for f/u visit.  She has been feeling a little lightheaded and dizzy, mostly when she goes from sitting to standing position w/o acutally passing out.  Denies CP, palpitations or SOB.  She is eating better and has lost 10 lbs.  Her ALF did not bring in a copy of her sugar log.  Denies feeling shakey or sweaty.   She is on Lasix every other day.  Denies vomitting or diarrhea.  Leg swelling has improved.    BP 115/60  Pulse 60  Wt 190 lb (86.183 kg)  SpO2 96%   Review of Systems  Constitutional: Positive for fatigue. Negative for fever, appetite change and unexpected weight change.  Eyes: Negative for visual disturbance.  Respiratory: Negative for shortness of breath.   Cardiovascular: Positive for leg swelling. Negative for chest pain and palpitations.  Genitourinary: Negative for difficulty urinating.  Musculoskeletal: Positive for gait problem.  Neurological: Positive for dizziness and light-headedness. Negative for weakness, numbness and headaches.       Objective:   Physical Exam  Constitutional: She appears well-developed and well-nourished.       Here with daughter, in wheelchair  HENT:  Mouth/Throat: Oropharynx is clear and moist.  Eyes: Conjunctivae are normal. No scleral icterus.  Neck: No thyromegaly present.  Cardiovascular: Normal rate, regular rhythm and normal heart sounds.   Pulmonary/Chest: Effort normal and breath sounds normal. No respiratory distress. She has no rales.  Abdominal: Soft. Bowel sounds are normal. She exhibits no distension.  Musculoskeletal: She exhibits edema (1+ bilat LE edema).  Skin: Skin is warm and dry.  Psychiatric: She has a normal mood and affect.          Assessment & Plan:   No problem-specific assessment & plan notes found for this encounter.

## 2010-07-30 NOTE — Assessment & Plan Note (Signed)
A1C good at 6.5.  Question if her fatigue is related to lower sugars now that she lost 10 lbs and is eating better.  Will STOP her Amaryl but keep everything else the same.  I do not have her sugar readings from her ALF to review today.  RTC for f/u in 6 wks.

## 2010-07-30 NOTE — Patient Instructions (Addendum)
Stop IMDUR. Increase intake of water to 6 glasses/ day. Stay on Lasix every OTHER day. Elevate legs.  RX for new wheelchair given. A1C:  Return for f/u low BPs/ Fatigue in 6 wks.

## 2010-07-30 NOTE — Assessment & Plan Note (Signed)
BP is a little low and she is having symptoms of orthostasis.  Will stop her imdur and will keep her Lasix every other day.  F/u in 6 wks.

## 2010-09-27 LAB — CBC
HCT: 33 — ABNORMAL LOW
MCHC: 34.6
Platelets: 168
RDW: 13.7

## 2010-09-27 LAB — URINALYSIS, ROUTINE W REFLEX MICROSCOPIC
Nitrite: NEGATIVE
Protein, ur: NEGATIVE
Specific Gravity, Urine: 1.011
Urobilinogen, UA: 0.2

## 2010-09-27 LAB — ABO/RH: ABO/RH(D): A POS

## 2010-09-27 LAB — TYPE AND SCREEN
ABO/RH(D): A POS
Antibody Screen: NEGATIVE

## 2010-09-27 LAB — BASIC METABOLIC PANEL
BUN: 12
CO2: 28
Calcium: 9.4
GFR calc non Af Amer: >60
Glucose, Bld: 108 — ABNORMAL HIGH

## 2010-09-27 LAB — PROTIME-INR: INR: 1

## 2010-10-14 LAB — CARDIAC PANEL(CRET KIN+CKTOT+MB+TROPI)
CK, MB: 1.6
Relative Index: INVALID
Relative Index: INVALID
Total CK: 64
Total CK: 73

## 2010-10-14 LAB — URINALYSIS, ROUTINE W REFLEX MICROSCOPIC
Glucose, UA: NEGATIVE
Nitrite: NEGATIVE
Specific Gravity, Urine: 1.006
pH: 7.5

## 2010-10-14 LAB — BASIC METABOLIC PANEL
CO2: 30
Creatinine, Ser: 1.23 — ABNORMAL HIGH
Glucose, Bld: 97
Sodium: 141

## 2010-10-14 LAB — COMPREHENSIVE METABOLIC PANEL
Albumin: 3.6
Alkaline Phosphatase: 95
BUN: 13
Calcium: 9.4
Creatinine, Ser: 1.15
Glucose, Bld: 123 — ABNORMAL HIGH
Potassium: 4.4
Total Protein: 6.9

## 2010-10-14 LAB — CBC
HCT: 37.3
Hemoglobin: 12.6
MCHC: 33.7
MCV: 92.9
Platelets: 224
RBC: 3.76 — ABNORMAL LOW
RDW: 13.3
WBC: 6.4

## 2010-10-14 LAB — POCT CARDIAC MARKERS
CKMB, poc: 1.5
Myoglobin, poc: 96.5
Operator id: 3206
Troponin i, poc: <0.05

## 2010-10-14 LAB — LIPASE, BLOOD: Lipase: 20

## 2010-10-14 LAB — CK TOTAL AND CKMB (NOT AT ARMC): CK, MB: 1.9

## 2010-10-26 ENCOUNTER — Telehealth: Payer: Self-pay | Admitting: Family Medicine

## 2010-10-26 NOTE — Telephone Encounter (Signed)
Daughter calling to see if we had ever received a medical release of records for her mother which is the pt from Dr. Domenick Gong office. Plan:  Told the pt's daughter thatn I do not see med release scanned in the system.  Pt's daughter will have the other office send the release again. Jarvis Newcomer, LPN Domingo Dimes

## 2010-12-09 LAB — LIPID PANEL
HDL: 37 mg/dL (ref 35–70)
LDL Cholesterol: 71 mg/dL
Triglycerides: 206 mg/dL — AB (ref 40–160)

## 2010-12-09 LAB — HEMOGLOBIN A1C: Hgb A1c MFr Bld: 6.9 % — AB (ref 4.0–6.0)

## 2011-03-08 ENCOUNTER — Telehealth: Payer: Self-pay | Admitting: Family Medicine

## 2011-03-08 DIAGNOSIS — Z950 Presence of cardiac pacemaker: Secondary | ICD-10-CM | POA: Insufficient documentation

## 2011-03-08 NOTE — Telephone Encounter (Signed)
[facility] and let them know that per our records patient is taking pravastin 40mg . Lets also  call pt first and confirm that she is taking this medication. WE also need to get an uptodate Lipid panel.  Please place order ahd have her go. Plus she needs f/u soon with me. It has been 6 months since her last OV.

## 2011-03-09 NOTE — Telephone Encounter (Signed)
Called and left message on vm; vm was a different name that pt and alt contact so may be a wrong num

## 2011-03-11 NOTE — Telephone Encounter (Signed)
Pt is taking Pravastatin 40mg  also.

## 2011-03-11 NOTE — Telephone Encounter (Signed)
Daughter Harriett Sine states that pt is at [facility] and that they have a NP that comes out to see the pt's there. States that they done a liver panel there in Dec. 2012 and she will get the results and have them faxed to [place] to have scanned in. Daughter states that she would like to have to stay as her provider if possible just in case they have to move her from there.

## 2011-03-16 ENCOUNTER — Encounter: Payer: Self-pay | Admitting: *Deleted

## 2011-04-10 ENCOUNTER — Encounter (HOSPITAL_BASED_OUTPATIENT_CLINIC_OR_DEPARTMENT_OTHER): Payer: Self-pay | Admitting: *Deleted

## 2011-04-10 ENCOUNTER — Emergency Department (INDEPENDENT_AMBULATORY_CARE_PROVIDER_SITE_OTHER): Payer: Medicare Other

## 2011-04-10 ENCOUNTER — Emergency Department (HOSPITAL_BASED_OUTPATIENT_CLINIC_OR_DEPARTMENT_OTHER)
Admission: EM | Admit: 2011-04-10 | Discharge: 2011-04-10 | Disposition: A | Discharge status: Home / Self Care | Payer: Medicare Other | Attending: Emergency Medicine | Admitting: Emergency Medicine

## 2011-04-10 DIAGNOSIS — I2510 Atherosclerotic heart disease of native coronary artery without angina pectoris: Secondary | ICD-10-CM | POA: Insufficient documentation

## 2011-04-10 DIAGNOSIS — IMO0002 Reserved for concepts with insufficient information to code with codable children: Secondary | ICD-10-CM | POA: Insufficient documentation

## 2011-04-10 DIAGNOSIS — M1990 Unspecified osteoarthritis, unspecified site: Secondary | ICD-10-CM | POA: Insufficient documentation

## 2011-04-10 DIAGNOSIS — I509 Heart failure, unspecified: Secondary | ICD-10-CM | POA: Insufficient documentation

## 2011-04-10 DIAGNOSIS — Z7982 Long term (current) use of aspirin: Secondary | ICD-10-CM | POA: Insufficient documentation

## 2011-04-10 DIAGNOSIS — T18108A Unspecified foreign body in esophagus causing other injury, initial encounter: Secondary | ICD-10-CM | POA: Insufficient documentation

## 2011-04-10 DIAGNOSIS — I10 Essential (primary) hypertension: Secondary | ICD-10-CM | POA: Insufficient documentation

## 2011-04-10 DIAGNOSIS — E785 Hyperlipidemia, unspecified: Secondary | ICD-10-CM | POA: Insufficient documentation

## 2011-04-10 DIAGNOSIS — X58XXXA Exposure to other specified factors, initial encounter: Secondary | ICD-10-CM

## 2011-04-10 DIAGNOSIS — Z79899 Other long term (current) drug therapy: Secondary | ICD-10-CM | POA: Insufficient documentation

## 2011-04-10 DIAGNOSIS — Z0389 Encounter for observation for other suspected diseases and conditions ruled out: Secondary | ICD-10-CM

## 2011-04-10 DIAGNOSIS — E119 Type 2 diabetes mellitus without complications: Secondary | ICD-10-CM | POA: Insufficient documentation

## 2011-04-10 DIAGNOSIS — T189XXA Foreign body of alimentary tract, part unspecified, initial encounter: Secondary | ICD-10-CM

## 2011-04-10 NOTE — ED Provider Notes (Signed)
History     CSN: [id number]  Arrival date & time 04/10/11  1514   First MD Initiated Contact with Patient 04/10/11 1530      Chief Complaint  Patient presents with  . Swallowed Foreign Body    (Consider location/radiation/quality/duration/timing/severity/associated sxs/prior treatment) HPI  Patient presents to the ED with complaints of having a chicken bone stuck in her throat. She says that her daughter came to get her from her home for lunch today and while she was eating something poked her in the back of the throat. Patient tells me this story without any trouble. She has drunk some water and eaten bread since then without choking, she just feels as if something was left in her throat. She denies having a history of any medical problems that predispose her to difficulty swallowing or breathing.   Past Medical History  Diagnosis Date  . MRSA infection 2007    X 2  . CAD (coronary artery disease)   . CHF (congestive heart failure)   . OAB (overactive bladder)   . Diabetes mellitus 2004    type 2  . Osteoarthritis   . Hyperlipidemia   . Hypertension   . Nephrolithiasis   . DDD (degenerative disc disease), lumbar     Past Surgical History  Procedure Date  . Pacemaker insertion 2006  . Appendectomy   . Abdominal hysterectomy   . Cholecystectomy   . Back surgery     X 2  . Cea 4-07  . Left hip replacement 6-07  . Kidney stone removal   . Lumbar fusion   . Cataract surgery   . Ankle surgery     Family History  Problem Relation Age of Onset  . Hypertension Mother   . Diabetes Mother   . Cancer Sister     breast    History  Substance Use Topics  . Smoking status: Never Smoker   . Smokeless tobacco: Not on file  . Alcohol Use: No    OB History    Grav Para Term Preterm Abortions TAB SAB Ect Mult Living                  Review of Systems  All other systems reviewed and are negative.    Allergies  Codeine sulfate  Home Medications   Current  Outpatient Rx  Name Route Sig Dispense Refill  . ACETAMINOPHEN 325 MG PO TABS Oral Take 650 mg by mouth every 6 (six) hours as needed.      . ALPRAZOLAM 0.5 MG PO TABS Oral Take 0.5 mg by mouth 2 (two) times daily as needed.      Marland Kitchen ALUM & MAG HYDROXIDE-SIMETH 500-450-40 MG/5ML PO SUSP Oral Take 30 mLs by mouth every 4 (four) hours as needed.      . AMBULATORY NON FORMULARY MEDICATION  Medication Name: Manual Wheelchair with seat cushion  Dx: weakness, generalized 1 Device 0  . ASPIRIN 81 MG PO TABS Oral Take 81 mg by mouth daily.      Marland Kitchen BISACODYL 10 MG RE SUPP Rectal Place 10 mg rectally as needed.      . BUPROPION HCL ER (SR) 200 MG PO TB12 Oral Take 200 mg by mouth 2 (two) times daily.      Marland Kitchen CALCIUM CARBONATE-VITAMIN D 500-200 MG-UNIT PO TABS Oral Take 1 tablet by mouth daily.      Marland Kitchen DOCUSATE SODIUM 100 MG PO CAPS Oral Take 100 mg by mouth 2 (two) times daily.      Marland Kitchen  DOXEPIN HCL 10 MG PO CAPS Oral Take 10 mg by mouth at bedtime.      Marland Kitchen ESOMEPRAZOLE MAGNESIUM 40 MG PO CPDR Oral Take 40 mg by mouth 2 (two) times daily.      Marland Kitchen ESTRADIOL 0.1 MG/GM VA CREA  Use vaginally three times weekly @@ qhs     . FUROSEMIDE 20 MG PO TABS  Take 1 tab po every other day.     Marland Kitchen GLIMEPIRIDE 1 MG PO TABS Oral Take 1 mg by mouth. At 12 noon     . GUAIFENESIN 100 MG/5ML PO SYRP  100 mg. 5 mls po q 4 hours prn cough     . HYDROCODONE-ACETAMINOPHEN 5-500 MG PO TABS Oral Take 1 tablet by mouth every 6 (six) hours as needed.      Marland Kitchen LORATADINE 10 MG PO TABS Oral Take 10 mg by mouth daily.      Marland Kitchen MECLIZINE HCL 12.5 MG PO TABS Oral Take 12.5 mg by mouth daily as needed.      Marland Kitchen METOPROLOL SUCCINATE ER 50 MG PO TB24 Oral Take 50 mg by mouth daily.      Marland Kitchen NITROGLYCERIN 0.4 MG SL SUBL Sublingual Place 0.4 mg under the tongue every 5 (five) minutes as needed.      Marland Kitchen ONDANSETRON HCL 8 MG PO TABS Oral Take by mouth every 8 (eight) hours as needed.      . OXYBUTYNIN CHLORIDE ER 10 MG PO TB24 Oral Take 10 mg by mouth daily.       Marland Kitchen PHENYLEPH-SHARK LIV OIL-MO-PET 0.25-3-14-71.9 % RE OINT Rectal Place rectally 2 (two) times daily as needed.      Marland Kitchen POLYETHYLENE GLYCOL 3350 PO PACK  Take 1 capful in 8 oz of liquid and drink everyday. (hold for diarrhea)     . POTASSIUM CHLORIDE 10 MEQ PO TBCR Oral Take 10 mEq by mouth daily.      Marland Kitchen PRAVASTATIN SODIUM 40 MG PO TABS Oral Take 40 mg by mouth at bedtime.      Marland Kitchen PROMETHAZINE HCL 12.5 MG RE SUPP Rectal Place 12.5 mg rectally every 6 (six) hours as needed.      Marland Kitchen SIMETHICONE 80 MG PO CHEW Oral Chew 80 mg by mouth every 4 (four) hours.      Marland Kitchen SITAGLIPTIN PHOSPHATE 50 MG PO TABS Oral Take 50 mg by mouth daily.      Marland Kitchen ZOLPIDEM TARTRATE 10 MG PO TABS Oral Take 10 mg by mouth at bedtime as needed.        BP 131/68  Pulse 82  Temp(Src) 98.1 F (36.7 C) (Oral)  Resp 20  Ht 5\' 3"  (1.6 m)  Wt 172 lb (78.019 kg)  BMI 30.47 kg/m2  SpO2 98%  Physical Exam  Nursing note and vitals reviewed. Constitutional: She appears well-developed and well-nourished. No distress. She is not intubated.  HENT:  Head: Normocephalic and atraumatic.  Eyes: Pupils are equal, round, and reactive to light.  Neck: Normal range of motion. Neck supple. No tracheal tenderness present. No tracheal deviation and normal range of motion present.  Cardiovascular: Normal rate and regular rhythm.   Pulmonary/Chest: No accessory muscle usage or stridor. No apnea, not tachypneic and not bradypneic. She is not intubated. No respiratory distress. She exhibits no tenderness, no crepitus, no edema and no retraction.  Abdominal: Soft.  Neurological: She is alert.  Skin: Skin is warm and dry.    ED Course  Procedures (including critical care time)  Labs Reviewed - No data to display Dg Neck Soft Tissue  04/10/2011  *RADIOLOGY REPORT*  Clinical Data: Swallowed foreign body  NECK SOFT TISSUES - 1+ VIEW  Comparison: None.  Findings: Single lateral views soft tissue neck submitted. Degenerative changes of cervical spine  noted.  No retropharyngeal soft tissue swelling.  Mild prevertebral soft tissue swelling at C4- C5 level.  The visualized laryngeal and cervical airway is patent. No radiopaque foreign body is identified.  The epiglottis is normal size.  IMPRESSION:  Degenerative changes of cervical spine noted.  No retropharyngeal soft tissue swelling.  Mild prevertebral soft tissue swelling at C4- C5 level.  The visualized laryngeal and cervical airway is patent. No radiopaque foreign body is identified.  Original Report Authenticated By: Natasha Mead, M.D.     1. Swallowed foreign body       MDM  I have low suspicion for retained foreign body in throat. Dr. Bebe Shaggy has examined patient as well and agrees with my treatment and plan to do plan films of the neck and if normal patient can be discharged and follow-up with her primary care provider.  Soft tissue of the neck shows no foreign body remains in neck, will discharge patient with instructions to follow-up with PCP.  Pt has been advised of the symptoms that warrant their return to the ED. Patient has voiced understanding and has agreed to follow-up with the PCP or specialist.         Dorthula Matas, PA 04/10/11 1621

## 2011-04-10 NOTE — Discharge Instructions (Signed)
Swallowed Foreign Body, Adult  You have swallowed an object (foreign body). Once the foreign body has passed through the food tube (esophagus), which leads from the mouth to the stomach, it will usually continue through the body without problems. This is because the point where the esophagus enters into the stomach is the narrowest place through which the foreign body must pass.  Sometimes the foreign body gets stuck. The most common type of foreign body obstruction in adults is food impaction. Many times, bones from fish or meat products may become lodged in the esophagus or injure the throat on the way down. When there is an object that obstructs the esophagus, the most obvious symptoms are pain and the inability to swallow normally. In some cases, foreign bodies that can be life threatening are swallowed. Examples of these are certain medications and illicit drugs. Often in these instances, patients are afraid of telling what they swallowed. However, it is extremely important to tell the emergency caregiver what was swallowed because life-saving treatment may be needed.   X-ray exams may be taken to find the location of the foreign body. However, some objects do not show up well or may be too small to be seen on an X-ray image. If the foreign body is too large or too sharp, it may be too dangerous to allow it to pass on its own. You may need to see a caregiver who specializes in the digestive system (gastroenterologist). In a few cases, a specialist may need to remove the object using a method called "endoscopy". This involves passing a thin, soft, flexible tube into the food pipe to locate and remove the object. Follow up with your primary doctor or the referral you were given by the emergency caregiver.  HOME CARE INSTRUCTIONS    If your caregiver says it is safe for you to eat, then only have liquids and soft foods until your symptoms improve.   Once you are eating normally:   Cut food into small  pieces.   Remove small bones from food.   Remove large seeds and pits from fruit.   Chew your food well.   Do not talk, laugh, or engage in physical activity while eating or swallowing.  SEEK MEDICAL CARE IF:   You develop worsening shortness of breath, uncontrollable coughing, chest pains or high fever, greater than 102 F (38.9 C).   You are unable to eat or drink or you feel that food is getting stuck in your throat.   You have choking symptoms or cannot stop drooling.   You develop abdominal pain, vomiting (especially of blood), or rectal bleeding.  MAKE SURE YOU:    Understand these instructions.   Will watch your condition.   Will get help right away if you are not doing well or get worse.  Document Released: 06/09/2009 Document Revised: 12/09/2010 Document Reviewed: 06/09/2009  [employer] Patient Information 2012 [employer]

## 2011-04-10 NOTE — ED Provider Notes (Signed)
Pt seen with PA Pt reports she may have swallowed a chicken bone She reports that she has been tolerating PO fluids since the incident No stridor noted, she is awake/alert Awaiting imaging at this time  Joya Gaskins, MD 04/10/11 [phone]

## 2011-04-10 NOTE — ED Notes (Signed)
Pt was eating chicken pot pie and ? Swallowed a bone or something. No distress. Tried crackers, bread with no relief.

## 2011-04-11 NOTE — ED Provider Notes (Signed)
Medical screening examination/treatment/procedure(s) were conducted as a shared visit with non-physician practitioner(s) and myself.  I personally evaluated the patient during the encounter   Joya Gaskins, MD 04/11/11 1030

## 2012-09-16 ENCOUNTER — Encounter: Payer: Self-pay | Admitting: *Deleted

## 2012-09-19 ENCOUNTER — Other Ambulatory Visit: Payer: Self-pay | Admitting: Cardiovascular Disease

## 2012-09-19 ENCOUNTER — Ambulatory Visit (INDEPENDENT_AMBULATORY_CARE_PROVIDER_SITE_OTHER): Payer: Medicare Other | Admitting: Cardiovascular Disease

## 2012-09-19 ENCOUNTER — Encounter: Payer: Self-pay | Admitting: Cardiovascular Disease

## 2012-09-19 VITALS — BP 130/74 | HR 65 | Resp 18 | Ht 63.0 in | Wt 181.9 lb

## 2012-09-19 DIAGNOSIS — I10 Essential (primary) hypertension: Secondary | ICD-10-CM

## 2012-09-19 DIAGNOSIS — I442 Atrioventricular block, complete: Secondary | ICD-10-CM

## 2012-09-19 DIAGNOSIS — I6522 Occlusion and stenosis of left carotid artery: Secondary | ICD-10-CM

## 2012-09-19 DIAGNOSIS — Z950 Presence of cardiac pacemaker: Secondary | ICD-10-CM

## 2012-09-19 DIAGNOSIS — I2510 Atherosclerotic heart disease of native coronary artery without angina pectoris: Secondary | ICD-10-CM

## 2012-09-19 DIAGNOSIS — I6529 Occlusion and stenosis of unspecified carotid artery: Secondary | ICD-10-CM

## 2012-09-19 LAB — PACEMAKER DEVICE OBSERVATION
AL THRESHOLD: 0.75 V
ATRIAL PACING PM: 1
BAMS-0003: 60 {beats}/min
DEVICE MODEL PM: 1369928
RV LEAD IMPEDENCE PM: 519 Ohm
RV LEAD THRESHOLD: 0.625 V
VENTRICULAR PACING PM: 99

## 2012-09-19 NOTE — Patient Instructions (Addendum)
Your physician recommends that you schedule a follow-up appointment in: 6 months  

## 2012-09-29 ENCOUNTER — Encounter: Payer: Self-pay | Admitting: Cardiovascular Disease

## 2012-09-29 DIAGNOSIS — I6529 Occlusion and stenosis of unspecified carotid artery: Secondary | ICD-10-CM | POA: Insufficient documentation

## 2012-09-29 NOTE — Assessment & Plan Note (Signed)
Good control

## 2012-09-29 NOTE — Progress Notes (Signed)
Patient ID: [name], female   DOB: 1923/09/08, 79 y.o.   MRN: [id number]     Reason for office visit F/U complete heart block, pacemaker, HTN, carotid and coronary atherosclerosis  She is in a nursing home and has gradual memory deterioration, still reasonably good quality of life. No cardiovascular complaints. No syncopal events. No significant tachyarrhythmia recorded on pacemaker. Normal device function by comprehensive in office check  Allergies  Allergen Reactions  . Codeine Sulfate   . Prevacid [Lansoprazole]     Current Outpatient Prescriptions  Medication Sig Dispense Refill  . acetaminophen (TYLENOL) 325 MG tablet Take 650 mg by mouth every 6 (six) hours as needed.        . ALPRAZolam (XANAX) 0.5 MG tablet Take 0.5 mg by mouth 2 (two) times daily as needed.        Marland Kitchen aluminum & magnesium hydroxide-simethicone (MYLANTA) 500-450-40 MG/5ML suspension Take 30 mLs by mouth every 4 (four) hours as needed.        Marland Kitchen amoxicillin (AMOXIL) 500 MG capsule Take 500 mg by mouth as needed. Take 4 tabs prior to dental work      . aspirin 81 MG tablet Take 81 mg by mouth daily.        . bisacodyl (DULCOLAX) 10 MG suppository Place 10 mg rectally as needed.        Marland Kitchen buPROPion (WELLBUTRIN SR) 200 MG 12 hr tablet Take 200 mg by mouth 2 (two) times daily.        . calcium-vitamin D (OSCAL WITH D) 500-200 MG-UNIT per tablet Take 1 tablet by mouth daily.        Marland Kitchen docusate sodium (COLACE) 100 MG capsule Take 100 mg by mouth 2 (two) times daily.        Marland Kitchen doxepin (SINEQUAN) 10 MG capsule Take 10 mg by mouth at bedtime.        Marland Kitchen esomeprazole (NEXIUM) 40 MG capsule Take 40 mg by mouth 2 (two) times daily.        Marland Kitchen estradiol (ESTRACE) 0.1 MG/GM vaginal cream Use vaginally three times weekly @@ qhs       . furosemide (LASIX) 20 MG tablet Take 1 tab po every other day.       . guaifenesin (TUSSIN) 100 MG/5ML syrup 100 mg. 5 mls po q 4 hours prn cough       . HYDROcodone-acetaminophen (VICODIN) 5-500  MG per tablet Take 1 tablet by mouth every 6 (six) hours as needed.        . loratadine (CLARITIN) 10 MG tablet Take 10 mg by mouth daily.        . meclizine (ANTIVERT) 12.5 MG tablet Take 12.5 mg by mouth daily as needed.        . meloxicam (MOBIC) 7.5 MG tablet Take 7.5 mg by mouth daily as needed for pain.      . metFORMIN (GLUCOPHAGE) 500 MG tablet Take 250 mg by mouth daily with breakfast.      . metoprolol (TOPROL-XL) 50 MG 24 hr tablet Take 50 mg by mouth daily.        . nitroGLYCERIN (NITROSTAT) 0.4 MG SL tablet Place 0.4 mg under the tongue every 5 (five) minutes as needed.        . ondansetron (ZOFRAN) 8 MG tablet Take by mouth every 8 (eight) hours as needed.        Marland Kitchen oxybutynin (DITROPAN-XL) 10 MG 24 hr tablet Take 10 mg by mouth daily.        Marland Kitchen  phenylephrine-shark liver oil-mineral oil-petrolatum (PREPARATION H) 0.25-3-14-71.9 % rectal ointment Place rectally 2 (two) times daily as needed.        . polyethylene glycol (MIRALAX / GLYCOLAX) packet Take 1 capful in 8 oz of liquid and drink everyday. (hold for diarrhea)       . potassium chloride (KLOR-CON) 10 MEQ CR tablet Take 10 mEq by mouth daily.        . promethazine (PHENERGAN) 12.5 MG suppository Place 12.5 mg rectally every 6 (six) hours as needed.        . simethicone (GAS-X) 80 MG chewable tablet Chew 80 mg by mouth every 4 (four) hours.        . sitaGLIPtan (JANUVIA) 50 MG tablet Take 50 mg by mouth daily.         No current facility-administered medications for this visit.    Past Medical History  Diagnosis Date  . MRSA infection 2007    X 2  . CAD (coronary artery disease)   . CHF (congestive heart failure)   . OAB (overactive bladder)   . Diabetes mellitus 2004    type 2  . Osteoarthritis   . Hyperlipidemia   . Hypertension   . Nephrolithiasis   . DDD (degenerative disc disease), lumbar   . CHB (complete heart block)     Permanent pacemaker- St.Jude  . Coronary artery stenosis     Past Surgical History    Procedure Laterality Date  . Pacemaker insertion  05/13/2004    St.Jude  . Appendectomy    . Abdominal hysterectomy    . Cholecystectomy    . Back surgery      X 2  . Cea  4-07  . Left hip replacement  6-07  . Kidney stone removal    . Lumbar fusion    . Cataract surgery    . Ankle surgery    . Cardiac catheterization  09/01/2004    diffuse small vessel disease w/large vessel 70% mid LAD stenosis  . US echocardiography  08/06/2008    Grade I diastolic dysfunction,trace TR,ca+ MV  . Nm myocar perf wall motion  08/18/2008    normal    Family History  Problem Relation Age of Onset  . Hypertension Mother   . Diabetes Mother   . Cancer Sister     breast  . Heart attack Mother   . Cancer Father     History   Social History  . Marital Status: Widowed    Spouse Name: N/A    Number of Children: N/A  . Years of Education: N/A   Occupational History  . Not on file.   Social History Main Topics  . Smoking status: Never Smoker   . Smokeless tobacco: Not on file  . Alcohol Use: No  . Drug Use:   . Sexual Activity:    Other Topics Concern  . Not on file   Social History Narrative  . No narrative on file    Review of systems: The patient specifically denies any chest pain at rest or with exertion, dyspnea at rest or with exertion, orthopnea, paroxysmal nocturnal dyspnea, syncope, palpitations, focal neurological deficits, intermittent claudication, lower extremity edema, unexplained weight gain, cough, hemoptysis or wheezing.  The patient also denies abdominal pain, nausea, vomiting, dysphagia, diarrhea, constipation, polyuria, polydipsia, dysuria, hematuria, frequency, urgency, abnormal bleeding or bruising, fever, chills, unexpected weight changes, mood swings, change in skin or hair texture, change in voice quality, auditory or visual problems, allergic reactions or  rashes, new musculoskeletal complaints other than usual "aches and pains".   PHYSICAL EXAM BP 130/74   Pulse 65  Resp 18  Ht 5\' 3"  (1.6 m)  Wt 181 lb 14.4 oz (82.509 kg)  BMI 32.23 kg/m2  General: Alert, oriented x3, no distress Head: no evidence of trauma, PERRL, EOMI, no exophtalmos or lid lag, no myxedema, no xanthelasma; normal ears, nose and oropharynx Neck: normal jugular venous pulsations and no hepatojugular reflux; brisk carotid pulses without delay and no carotid bruits Chest: clear to auscultation, no signs of consolidation by percussion or palpation, normal fremitus, symmetrical and full respiratory excursions; healthy pacemaker site. Cardiovascular: normal position and quality of the apical impulse, regular rhythm, normal first and second heart sounds, no murmurs, rubs or gallops Abdomen: no tenderness or distention, no masses by palpation, no abnormal pulsatility or arterial bruits, normal bowel sounds, no hepatosplenomegaly Extremities: no clubbing, cyanosis or edema; 2+ radial, ulnar and brachial pulses bilaterally; 2+ right femoral, posterior tibial and dorsalis pedis pulses; 2+ left femoral, posterior tibial and dorsalis pedis pulses; no subclavian or femoral bruits Neurological: grossly nonfocal   EKG: A sensed V paced  Lipid Panel     Component Value Date/Time   CHOL 149 12/09/2010   TRIG 206* 12/09/2010   HDL 37 12/09/2010   CHOLHDL 4.0 Ratio 08/12/2009 0005   VLDL 50* 08/12/2009 0005   LDLCALC 71 12/09/2010    BMET    Component Value Date/Time   NA 138 01/06/2010 1442   K 4.9 01/06/2010 1442   CL 101 01/06/2010 1442   CO2 31 01/06/2010 1442   GLUCOSE 106* 01/06/2010 1442   BUN 16 01/06/2010 1442   CREATININE 1.00 01/06/2010 1442   CALCIUM 9.7 01/06/2010 1442   GFRNONAA 51* 05/01/2009 0430   GFRAA  Value: >60        The eGFR has been calculated using the MDRD equation. This calculation has not been validated in all clinical situations. eGFR's persistently <60 mL/min signify possible Chronic Kidney Disease. 05/01/2009 0430     ASSESSMENT AND PLAN Pacemaker - St. Jude, non RF  dual chamber, 2006 Pacemaker dependent/complete heart block. Normal device function. Pacemaker check in clinic. Normal device function. Thresholds, sensing, impedances consistent with previous measurements. Device programmed to maximize longevity. No mode switch or high ventricular rates noted. Device programmed at appropriate safety margins. Histogram distribution appropriate for patient activity level. Device programmed to optimize intrinsic conduction. Estimated longevity 4-6 years. Patient will follow upin 6 months.   CAD Nonobstructive by cath 2006, normal nuclear study 2010, no angina or dyspnea.  HYPERTENSION, BENIGN ESSENTIAL Good control.  Carotid stenosis Left carotid endarterectomy site patent by Duplex US 2013. No routine monitoring due to advanced age and memory problems.  Orders Placed This Encounter  Procedures  . EKG 12-Lead   Meds ordered this encounter  Medications  . metFORMIN (GLUCOPHAGE) 500 MG tablet    Sig: Take 250 mg by mouth daily with breakfast.  . meloxicam (MOBIC) 7.5 MG tablet    Sig: Take 7.5 mg by mouth daily as needed for pain.  Marland Kitchen amoxicillin (AMOXIL) 500 MG capsule    Sig: Take 500 mg by mouth as needed. Take 4 tabs prior to dental work    Haaris Metallo  Thurmon Fair, MD, [facility] [phone] office [phone] pager

## 2012-09-29 NOTE — Assessment & Plan Note (Signed)
Nonobstructive by cath 2006, normal nuclear study 2010, no angina or dyspnea.

## 2012-09-29 NOTE — Assessment & Plan Note (Signed)
Left carotid endarterectomy site patent by Duplex US 2013. No routine monitoring due to advanced age and memory problems.

## 2012-09-29 NOTE — Assessment & Plan Note (Signed)
Pacemaker dependent/complete heart block. Normal device function. Pacemaker check in clinic. Normal device function. Thresholds, sensing, impedances consistent with previous measurements. Device programmed to maximize longevity. No mode switch or high ventricular rates noted. Device programmed at appropriate safety margins. Histogram distribution appropriate for patient activity level. Device programmed to optimize intrinsic conduction. Estimated longevity 4-6 years. Patient will follow upin 6 months.

## 2013-03-21 ENCOUNTER — Ambulatory Visit (INDEPENDENT_AMBULATORY_CARE_PROVIDER_SITE_OTHER): Payer: Medicare Other | Admitting: Cardiovascular Disease

## 2013-03-21 VITALS — BP 122/70 | HR 72 | Ht 63.0 in | Wt 179.0 lb

## 2013-03-21 DIAGNOSIS — I2510 Atherosclerotic heart disease of native coronary artery without angina pectoris: Secondary | ICD-10-CM

## 2013-03-21 DIAGNOSIS — I498 Other specified cardiac arrhythmias: Secondary | ICD-10-CM

## 2013-03-21 DIAGNOSIS — I442 Atrioventricular block, complete: Secondary | ICD-10-CM

## 2013-03-21 DIAGNOSIS — Z950 Presence of cardiac pacemaker: Secondary | ICD-10-CM

## 2013-03-21 LAB — PACEMAKER DEVICE OBSERVATION

## 2013-03-21 NOTE — Progress Notes (Signed)
Patient ID: [name], female   DOB: [birth year], 80 y.o.   MRN: [id number]     Reason for office visit Pacemaker check   [name] has a dual-chamber permanent pacemaker implanted for complete heart block (pacemaker dependent). She is a nursing home patient with progressive dementia, minor coronary artery disease, previous left carotid endarterectomy, hypertension and type 2 diabetes mellitus . She is more interactive today than at previous appointments. She has no complaints. Her pacemaker interrogation shows normal function.   Allergies  Allergen Reactions  . Codeine Sulfate   . Prevacid [Lansoprazole]     Current Outpatient Prescriptions  Medication Sig Dispense Refill  . acetaminophen (TYLENOL) 325 MG tablet Take 650 mg by mouth every 6 (six) hours as needed.        . ALPRAZolam (XANAX) 0.5 MG tablet Take 0.5 mg by mouth 2 (two) times daily as needed.        Marland Kitchen aluminum & magnesium hydroxide-simethicone (MYLANTA) 500-450-40 MG/5ML suspension Take 30 mLs by mouth every 4 (four) hours as needed.        Marland Kitchen amoxicillin (AMOXIL) 500 MG capsule Take 500 mg by mouth as needed. Take 4 tabs prior to dental work      . aspirin 81 MG tablet Take 81 mg by mouth daily.        . bisacodyl (DULCOLAX) 10 MG suppository Place 10 mg rectally as needed.        Marland Kitchen buPROPion (WELLBUTRIN SR) 200 MG 12 hr tablet Take 200 mg by mouth 2 (two) times daily.        . calcium-vitamin D (OSCAL WITH D) 500-200 MG-UNIT per tablet Take 1 tablet by mouth daily.        Marland Kitchen docusate sodium (COLACE) 100 MG capsule Take 100 mg by mouth 2 (two) times daily.        Marland Kitchen esomeprazole (NEXIUM) 40 MG capsule Take 40 mg by mouth 2 (two) times daily.        Marland Kitchen estradiol (ESTRACE) 0.1 MG/GM vaginal cream Use vaginally three times weekly @@ qhs       . furosemide (LASIX) 20 MG tablet Take 1 tab po every other day.       . guaifenesin (TUSSIN) 100 MG/5ML syrup 100 mg. 5 mls po q 4 hours prn cough       . HYDROcodone-acetaminophen  (VICODIN) 5-500 MG per tablet Take 1 tablet by mouth every 6 (six) hours as needed.        . loratadine (CLARITIN) 10 MG tablet Take 10 mg by mouth daily.        . meclizine (ANTIVERT) 12.5 MG tablet Take 12.5 mg by mouth daily as needed.        . meloxicam (MOBIC) 7.5 MG tablet Take 7.5 mg by mouth daily as needed for pain.      . metFORMIN (GLUCOPHAGE) 500 MG tablet Take 250 mg by mouth daily with breakfast.      . metoprolol (TOPROL-XL) 50 MG 24 hr tablet Take 50 mg by mouth daily.        . nitroGLYCERIN (NITROSTAT) 0.4 MG SL tablet Place 0.4 mg under the tongue every 5 (five) minutes as needed.        . ondansetron (ZOFRAN) 8 MG tablet Take by mouth every 8 (eight) hours as needed.        Marland Kitchen oxybutynin (DITROPAN-XL) 10 MG 24 hr tablet Take 10 mg by mouth daily.        Marland Kitchen  phenylephrine-shark liver oil-mineral oil-petrolatum (PREPARATION H) 0.25-3-14-71.9 % rectal ointment Place rectally 2 (two) times daily as needed.        . polyethylene glycol (MIRALAX / GLYCOLAX) packet Take 1 capful in 8 oz of liquid and drink everyday. (hold for diarrhea)       . potassium chloride (KLOR-CON) 10 MEQ CR tablet Take 10 mEq by mouth daily.        . promethazine (PHENERGAN) 12.5 MG suppository Place 12.5 mg rectally every 6 (six) hours as needed.        . simethicone (GAS-X) 80 MG chewable tablet Chew 80 mg by mouth every 4 (four) hours.        . sitaGLIPtan (JANUVIA) 50 MG tablet Take 50 mg by mouth daily.        Marland Kitchen doxepin (SINEQUAN) 10 MG capsule Take 10 mg by mouth at bedtime.         No current facility-administered medications for this visit.    Past Medical History  Diagnosis Date  . MRSA infection 2007    X 2  . CAD (coronary artery disease)   . CHF (congestive heart failure)   . OAB (overactive bladder)   . Diabetes mellitus 2004    type 2  . Osteoarthritis   . Hyperlipidemia   . Hypertension   . Nephrolithiasis   . DDD (degenerative disc disease), lumbar   . CHB (complete heart block)      Permanent pacemaker- St.Jude  . Coronary artery stenosis     Past Surgical History  Procedure Laterality Date  . Pacemaker insertion  05/13/2004    St.Jude  . Appendectomy    . Abdominal hysterectomy    . Cholecystectomy    . Back surgery      X 2  . Cea  4-07  . Left hip replacement  6-07  . Kidney stone removal    . Lumbar fusion    . Cataract surgery    . Ankle surgery    . Cardiac catheterization  09/01/2004    diffuse small vessel disease w/large vessel 70% mid LAD stenosis  . US echocardiography  08/06/2008    Grade I diastolic dysfunction,trace TR,ca+ MV  . Nm myocar perf wall motion  08/18/2008    normal    Family History  Problem Relation Age of Onset  . Hypertension Mother   . Diabetes Mother   . Cancer Sister     breast  . Heart attack Mother   . Cancer Father     History   Social History  . Marital Status: Widowed    Spouse Name: N/A    Number of Children: N/A  . Years of Education: N/A   Occupational History  . Not on file.   Social History Main Topics  . Smoking status: Never Smoker   . Smokeless tobacco: Not on file  . Alcohol Use: No  . Drug Use:   . Sexual Activity:    Other Topics Concern  . Not on file   Social History Narrative  . No narrative on file    Review of systems: Short of breath with exertion, very sedentary The patient specifically denies any chest pain at rest, dyspnea at rest or with exertion, orthopnea, paroxysmal nocturnal dyspnea, syncope, palpitations, focal neurological deficits, intermittent claudication, lower extremity edema, unexplained weight gain, cough, hemoptysis or wheezing.  The patient also denies abdominal pain, nausea, vomiting, dysphagia, diarrhea, constipation, polyuria, polydipsia, dysuria, hematuria, frequency, urgency, abnormal bleeding or bruising, fever,  chills, unexpected weight changes, mood swings, change in skin or hair texture, change in voice quality, auditory or visual problems, allergic  reactions or rashes, new musculoskeletal complaints other than usual "aches and pains".   PHYSICAL EXAM BP 122/70  Pulse 72  Ht _0  (1.6 m)  Wt 81.194 kg (179 lb)  BMI 31.72 kg/m2 General: Alert, oriented x3, no distress  Head: no evidence of trauma, PERRL, EOMI, no exophtalmos or lid lag, no myxedema, no xanthelasma; normal ears, nose and oropharynx  Neck: normal jugular venous pulsations and no hepatojugular reflux; brisk carotid pulses without delay and no carotid bruits  Chest: clear to auscultation, no signs of consolidation by percussion or palpation, normal fremitus, symmetrical and full respiratory excursions; healthy pacemaker site.  Cardiovascular: normal position and quality of the apical impulse, regular rhythm, normal first and second heart sounds, no murmurs, rubs or gallops  Abdomen: no tenderness or distention, no masses by palpation, no abnormal pulsatility or arterial bruits, normal bowel sounds, no hepatosplenomegaly  Extremities: no clubbing, cyanosis or edema; 2+ radial, ulnar and brachial pulses bilaterally; 2+ right femoral, posterior tibial and dorsalis pedis pulses; 2+ left femoral, posterior tibial and dorsalis pedis pulses; no subclavian or femoral bruits  Neurological: grossly nonfocal  EKG: A sensed V paced  Multivessel coronary artery disease by cath 2000, with widespread stenoses felt best suited for medical therapy  EKG: Atrial sensed ventricular paced  Lipid Panel     Component Value Date/Time   CHOL 149 12/09/2010   TRIG 206* 12/09/2010   HDL 37 12/09/2010   CHOLHDL 4.0 Ratio 08/12/2009 0005   VLDL 50* 08/12/2009 0005   LDLCALC 71 12/09/2010    BMET    Component Value Date/Time   NA 138 01/06/2010 1442   K 4.9 01/06/2010 1442   CL 101 01/06/2010 1442   CO2 31 01/06/2010 1442   GLUCOSE 106* 01/06/2010 1442   BUN 16 01/06/2010 1442   CREATININE 1.00 01/06/2010 1442   CALCIUM 9.7 01/06/2010 1442   GFRNONAA 51* 05/01/2009 0430   GFRAA  Value: >60        The eGFR  has been calculated using the MDRD equation. This calculation has not been validated in all clinical situations. eGFR's persistently <60 mL/min signify possible Chronic Kidney Disease. 05/01/2009 0430     ASSESSMENT AND PLAN No problem-specific assessment & plan notes found for this encounter.  Orders Placed This Encounter  Procedures  . Implantable device check  . EKG 12-Lead   No orders of the defined types were placed in this encounter.    Holli Humbles, MD, [place] [phone] office [phone] pager

## 2013-03-21 NOTE — Patient Instructions (Addendum)
Your physician recommends that you schedule a follow-up appointment in: 6 Months with Dr. Royann Shiversroitoru with a pacemaker check.

## 2013-03-22 LAB — MDC_IDC_ENUM_SESS_TYPE_INCLINIC
Battery Impedance: 2000 Ohm
Battery Voltage: 2.75 V
Brady Statistic RA Percent Paced: 1 % — CL
Implantable Pulse Generator Serial Number: 1369928
Lead Channel Impedance Value: 479 Ohm
Lead Channel Pacing Threshold Pulse Width: 0.5 ms
Lead Channel Sensing Intrinsic Amplitude: 2 mV
Lead Channel Setting Pacing Amplitude: 2 V
Lead Channel Setting Pacing Pulse Width: 0.5 ms
Lead Channel Setting Sensing Sensitivity: 2 mV
MDC IDC MSMT LEADCHNL RA PACING THRESHOLD AMPLITUDE: 0.75 V
MDC IDC MSMT LEADCHNL RA PACING THRESHOLD PULSEWIDTH: 0.5 ms
MDC IDC MSMT LEADCHNL RV IMPEDANCE VALUE: 538 Ohm
MDC IDC MSMT LEADCHNL RV PACING THRESHOLD AMPLITUDE: 0.5 V
MDC IDC MSMT LEADCHNL RV SENSING INTR AMPL: 6.7 mV
MDC IDC SESS DTM: 20150319154631
MDC IDC STAT BRADY RV PERCENT PACED: 99 % — AB

## 2013-03-24 ENCOUNTER — Encounter: Payer: Self-pay | Admitting: Cardiovascular Disease

## 2013-03-24 NOTE — Assessment & Plan Note (Signed)
St Jude Verity XL DR [phone], implanted 2006, non RF. Pacemaker check in clinic. Normal device function. Thresholds, sensing, impedances consistent with previous measurements. Device programmed to maximize longevity. No mode switch or high ventricular rates noted. Device programmed at appropriate safety  margins. Histogram distribution appropriate for patient activity level. Device programmed to optimize intrinsic conduction. Estimated longevity 3.75-5.5 years.

## 2013-10-15 ENCOUNTER — Ambulatory Visit (INDEPENDENT_AMBULATORY_CARE_PROVIDER_SITE_OTHER): Payer: Medicare Other | Admitting: Cardiovascular Disease

## 2013-10-15 ENCOUNTER — Encounter: Payer: Self-pay | Admitting: Cardiovascular Disease

## 2013-10-15 VITALS — BP 138/60 | HR 67 | Resp 16 | Ht 63.0 in | Wt 176.2 lb

## 2013-10-15 DIAGNOSIS — I2510 Atherosclerotic heart disease of native coronary artery without angina pectoris: Secondary | ICD-10-CM

## 2013-10-15 DIAGNOSIS — I442 Atrioventricular block, complete: Secondary | ICD-10-CM

## 2013-10-15 DIAGNOSIS — I10 Essential (primary) hypertension: Secondary | ICD-10-CM

## 2013-10-15 DIAGNOSIS — I441 Atrioventricular block, second degree: Secondary | ICD-10-CM

## 2013-10-15 DIAGNOSIS — Z950 Presence of cardiac pacemaker: Secondary | ICD-10-CM

## 2013-10-15 LAB — MDC_IDC_ENUM_SESS_TYPE_INCLINIC
Battery Impedance: 2400 Ohm
Date Time Interrogation Session: 20151013152523
Implantable Pulse Generator Model: 5356
Implantable Pulse Generator Serial Number: 1369928
Lead Channel Impedance Value: 456 Ohm
Lead Channel Impedance Value: 501 Ohm
Lead Channel Pacing Threshold Pulse Width: 0.5 ms
Lead Channel Pacing Threshold Pulse Width: 0.5 ms
Lead Channel Sensing Intrinsic Amplitude: 6.2 mV
MDC IDC MSMT BATTERY VOLTAGE: 2.75 V
MDC IDC MSMT LEADCHNL RA PACING THRESHOLD AMPLITUDE: 0.75 V
MDC IDC MSMT LEADCHNL RA SENSING INTR AMPL: 1.8 mV
MDC IDC MSMT LEADCHNL RV PACING THRESHOLD AMPLITUDE: 0.625 V
MDC IDC SET LEADCHNL RA PACING AMPLITUDE: 2 V
MDC IDC SET LEADCHNL RV PACING PULSEWIDTH: 0.5 ms
MDC IDC SET LEADCHNL RV SENSING SENSITIVITY: 2 mV

## 2013-10-15 LAB — PACEMAKER DEVICE OBSERVATION

## 2013-10-15 NOTE — Patient Instructions (Signed)
Your physician recommends that you schedule a follow-up appointment in: 6 months with Dr.Croitoru    

## 2013-10-15 NOTE — Progress Notes (Signed)
Patient ID: [name], female   DOB: [birth year], 80 y.o.   MRN: [id number]     Reason for office visit Pacemaker followup  [name] has a dual-chamber permanent pacemaker implanted in 2006 and has high grade second degree AV block with intermittent complete heart block (pacemaker dependent). She is a nursing home patient with progressive dementia, widespread multivessel but relatively minor coronary artery disease, previous left carotid endarterectomy, hypertension and type 2 diabetes mellitus . She is smiling and seems to be doing well. She has no complaints. Her pacemaker interrogation shows normal function. She has 100% ventricular pacing but has very infrequent atrial pacing. No meaningful tachyarrhythmia has been recorded. Lead and generator parameters are normal. Estimated battery longevity is 3-4 years.  Allergies  Allergen Reactions  . Codeine Sulfate   . Prevacid [Lansoprazole]     Current Outpatient Prescriptions  Medication Sig Dispense Refill  . acetaminophen (TYLENOL) 325 MG tablet Take 650 mg by mouth every 6 (six) hours as needed.        . ALPRAZolam (XANAX) 0.5 MG tablet Take 0.5 mg by mouth 2 (two) times daily as needed.        Marland Kitchen aluminum & magnesium hydroxide-simethicone (MYLANTA) 500-450-40 MG/5ML suspension Take 30 mLs by mouth every 4 (four) hours as needed.        Marland Kitchen aspirin 81 MG tablet Take 81 mg by mouth daily.        . bisacodyl (DULCOLAX) 10 MG suppository Place 10 mg rectally as needed.        Marland Kitchen buPROPion (WELLBUTRIN SR) 200 MG 12 hr tablet Take 200 mg by mouth 2 (two) times daily.        . calcium-vitamin D (OSCAL WITH D) 500-200 MG-UNIT per tablet Take 1 tablet by mouth daily.        Marland Kitchen docusate sodium (COLACE) 100 MG capsule Take 100 mg by mouth 2 (two) times daily.        Marland Kitchen doxepin (SINEQUAN) 10 MG capsule Take 10 mg by mouth at bedtime.        Marland Kitchen esomeprazole (NEXIUM) 40 MG capsule Take 40 mg by mouth 2 (two) times daily.        Marland Kitchen estradiol (ESTRACE)  0.1 MG/GM vaginal cream Use vaginally three times weekly @@ qhs       . furosemide (LASIX) 20 MG tablet Take 1 tab po every other day.       . guaifenesin (TUSSIN) 100 MG/5ML syrup 100 mg. 5 mls po q 4 hours prn cough       . HYDROcodone-acetaminophen (VICODIN) 5-500 MG per tablet Take 1 tablet by mouth every 6 (six) hours as needed.        . loratadine (CLARITIN) 10 MG tablet Take 10 mg by mouth daily.        . meclizine (ANTIVERT) 12.5 MG tablet Take 12.5 mg by mouth daily as needed.        . meloxicam (MOBIC) 7.5 MG tablet Take 7.5 mg by mouth daily as needed for pain.      . metFORMIN (GLUCOPHAGE) 500 MG tablet Take 250 mg by mouth daily with breakfast.      . nitroGLYCERIN (NITROSTAT) 0.4 MG SL tablet Place 0.4 mg under the tongue every 5 (five) minutes as needed.        . ondansetron (ZOFRAN) 8 MG tablet Take by mouth every 8 (eight) hours as needed.        Marland Kitchen oxybutynin (DITROPAN-XL) 10 MG  24 hr tablet Take 10 mg by mouth daily.        . phenylephrine-shark liver oil-mineral oil-petrolatum (PREPARATION H) 0.25-3-14-71.9 % rectal ointment Place rectally 2 (two) times daily as needed.        . polyethylene glycol (MIRALAX / GLYCOLAX) packet Take 1 capful in 8 oz of liquid and drink everyday. (hold for diarrhea)       . potassium chloride (KLOR-CON) 10 MEQ CR tablet Take 10 mEq by mouth daily.        . promethazine (PHENERGAN) 12.5 MG suppository Place 12.5 mg rectally every 6 (six) hours as needed.        . simethicone (GAS-X) 80 MG chewable tablet Chew 80 mg by mouth every 4 (four) hours.        . sitaGLIPtan (JANUVIA) 50 MG tablet Take 50 mg by mouth daily.         No current facility-administered medications for this visit.    Past Medical History  Diagnosis Date  . MRSA infection 2007    X 2  . CAD (coronary artery disease)   . CHF (congestive heart failure)   . OAB (overactive bladder)   . Diabetes mellitus 2004    type 2  . Osteoarthritis   . Hyperlipidemia   . Hypertension    . Nephrolithiasis   . DDD (degenerative disc disease), lumbar   . CHB (complete heart block)     Permanent pacemaker- St.Jude  . Coronary artery stenosis     Past Surgical History  Procedure Laterality Date  . Pacemaker insertion  05/13/2004    St.Jude  . Appendectomy    . Abdominal hysterectomy    . Cholecystectomy    . Back surgery      X 2  . Cea  4-07  . Left hip replacement  6-07  . Kidney stone removal    . Lumbar fusion    . Cataract surgery    . Ankle surgery    . Cardiac catheterization  09/01/2004    diffuse small vessel disease w/large vessel 70% mid LAD stenosis  . US echocardiography  08/06/2008    Grade I diastolic dysfunction,trace TR,ca+ MV  . Nm myocar perf wall motion  08/18/2008    normal    Family History  Problem Relation Age of Onset  . Hypertension Mother   . Diabetes Mother   . Cancer Sister     breast  . Heart attack Mother   . Cancer Father     History   Social History  . Marital Status: Widowed    Spouse Name: N/A    Number of Children: N/A  . Years of Education: N/A   Occupational History  . Not on file.   Social History Main Topics  . Smoking status: Never Smoker   . Smokeless tobacco: Not on file  . Alcohol Use: No  . Drug Use:   . Sexual Activity:    Other Topics Concern  . Not on file   Social History Narrative  . No narrative on file    Review of systems: She complains of a lot of arthritis pain, especially in her shoulders. The patient specifically denies any chest pain at rest or with exertion, dyspnea at rest or with exertion, orthopnea, paroxysmal nocturnal dyspnea, syncope, palpitations, focal neurological deficits, intermittent claudication, lower extremity edema, unexplained weight gain, cough, hemoptysis or wheezing.  The patient also denies abdominal pain, nausea, vomiting, dysphagia, diarrhea, constipation, polyuria, polydipsia, dysuria, hematuria, frequency, urgency,  abnormal bleeding or bruising, fever,  chills, unexpected weight changes, mood swings, change in skin or hair texture, change in voice quality, auditory or visual problems, allergic reactions or rashes, new musculoskeletal complaints other than usual "aches and pains".   PHYSICAL EXAM BP 138/60  Pulse 67  Resp 16  Ht '5\' 3"'  (1.6 m)  Wt 79.924 kg (176 lb 3.2 oz)  BMI 31.22 kg/m2 General: Alert, oriented x3, no distress  Head: no evidence of trauma, PERRL, EOMI, no exophtalmos or lid lag, no myxedema, no xanthelasma; normal ears, nose and oropharynx  Neck: normal jugular venous pulsations and no hepatojugular reflux; brisk carotid pulses without delay and no carotid bruits  Chest: clear to auscultation, no signs of consolidation by percussion or palpation, normal fremitus, symmetrical and full respiratory excursions; healthy pacemaker site.  Cardiovascular: normal position and quality of the apical impulse, regular rhythm, normal first and second heart sounds, no murmurs, rubs or gallops  Abdomen: no tenderness or distention, no masses by palpation, no abnormal pulsatility or arterial bruits, normal bowel sounds, no hepatosplenomegaly  Extremities: no clubbing, cyanosis or edema; 2+ radial, ulnar and brachial pulses bilaterally; 2+ right femoral, posterior tibial and dorsalis pedis pulses; 2+ left femoral, posterior tibial and dorsalis pedis pulses; no subclavian or femoral bruits  Neurological: grossly nonfocal   EKG: A sensed V paced   Multivessel coronary artery disease by cath 2000, with widespread stenoses felt best suited for medical therapy    Lipid Panel     Component Value Date/Time   CHOL 149 12/09/2010   TRIG 206* 12/09/2010   HDL 37 12/09/2010   CHOLHDL 4.0 Ratio 08/12/2009 0005   VLDL 50* 08/12/2009 0005   LDLCALC 71 12/09/2010    BMET    Component Value Date/Time   NA 138 01/06/2010 1442   K 4.9 01/06/2010 1442   CL 101 01/06/2010 1442   CO2 31 01/06/2010 1442   GLUCOSE 106* 01/06/2010 1442   BUN 16 01/06/2010 1442    CREATININE 1.00 01/06/2010 1442   CALCIUM 9.7 01/06/2010 1442   GFRNONAA 51* 05/01/2009 0430   GFRAA  Value: >60        The eGFR has been calculated using the MDRD equation. This calculation has not been validated in all clinical situations. eGFR's persistently <60 mL/min signify possible Chronic Kidney Disease. 05/01/2009 0430     ASSESSMENT AND PLAN  [name] does have an underlying ventricular rhythm at about 34 beats per minute, but has 100% ventricular pacing and should technically be considered pacemaker dependent. Her dual-chamber permanent pacemaker has normal function. Unfortunately it is not amenable to remote monitoring. Transportation to and from the nursing home is difficult due to her arthritis, will continue checking the device every 6 months. When the pacemaker generator is closer to elective replacement indicator, will increase the frequency of pacemaker checks.  She continues to have no symptoms he is and has no clinical manifestations of congestive heart failure.  Orders Placed This Encounter  Procedures  . EKG 12-Lead   No orders of the defined types were placed in this encounter.    Holli Humbles, MD, [place] [phone] office [phone] pager

## 2013-10-21 ENCOUNTER — Encounter: Payer: Self-pay | Admitting: Cardiovascular Disease

## 2014-03-04 HISTORY — PX: SKIN CANCER EXCISION: SHX779

## 2014-04-04 ENCOUNTER — Other Ambulatory Visit: Payer: Self-pay | Admitting: Dermatology

## 2014-06-17 ENCOUNTER — Encounter: Payer: Self-pay | Admitting: Cardiovascular Disease

## 2014-06-17 ENCOUNTER — Ambulatory Visit (INDEPENDENT_AMBULATORY_CARE_PROVIDER_SITE_OTHER): Payer: Medicare Other | Admitting: Cardiovascular Disease

## 2014-06-17 VITALS — BP 114/60 | HR 73 | Ht 63.0 in | Wt 168.9 lb

## 2014-06-17 DIAGNOSIS — I442 Atrioventricular block, complete: Secondary | ICD-10-CM | POA: Diagnosis not present

## 2014-06-17 DIAGNOSIS — I2510 Atherosclerotic heart disease of native coronary artery without angina pectoris: Secondary | ICD-10-CM | POA: Diagnosis not present

## 2014-06-17 DIAGNOSIS — Z950 Presence of cardiac pacemaker: Secondary | ICD-10-CM | POA: Diagnosis not present

## 2014-06-17 DIAGNOSIS — I441 Atrioventricular block, second degree: Secondary | ICD-10-CM | POA: Diagnosis not present

## 2014-06-17 LAB — CUP PACEART INCLINIC DEVICE CHECK
Battery Voltage: 2.75 V
Date Time Interrogation Session: 20160614124534
Lead Channel Impedance Value: 523 Ohm
Lead Channel Pacing Threshold Amplitude: 0.625 V
Lead Channel Pacing Threshold Pulse Width: 0.5 ms
Lead Channel Pacing Threshold Pulse Width: 0.5 ms
Lead Channel Sensing Intrinsic Amplitude: 1.7 mV
Lead Channel Sensing Intrinsic Amplitude: 6.7 mV
MDC IDC MSMT LEADCHNL RA IMPEDANCE VALUE: 456 Ohm
MDC IDC MSMT LEADCHNL RA PACING THRESHOLD AMPLITUDE: 0.75 V
MDC IDC SET LEADCHNL RA PACING AMPLITUDE: 2 V
MDC IDC SET LEADCHNL RV PACING PULSEWIDTH: 0.5 ms
MDC IDC SET LEADCHNL RV SENSING SENSITIVITY: 2 mV
MDC IDC STAT BRADY RA PERCENT PACED: 1 % — AB
MDC IDC STAT BRADY RV PERCENT PACED: 99 % — AB
Pulse Gen Model: 5356
Pulse Gen Serial Number: 1369928

## 2014-06-17 NOTE — Patient Instructions (Signed)
Dr. Royann Shivers recommends that you schedule a follow-up appointment in: 6 months with pacermaker check.

## 2014-06-18 NOTE — Progress Notes (Signed)
Patient ID: [name], female   DOB: 05-07-1923, 81 y.o.   MRN: [id number]     Cardiology Office Note   Date:  06/18/2014   ID:  [name], DOB 05/05/1923, MRN [id number]  PCP:  Keane Police, MD  Cardiologist:   Thurmon Fair, MD   Chief Complaint  Patient presents with  . Follow-up    no complaints other than no energy.      History of Present Illness: [name] is a 81 y.o. female who presents for high-grade second-degree AV block with intermittent complete heart block and pacemaker check.  Should be considered pacemaker dependent. She has progressive dementia and lives in a nursing home. She is very inactive due to orthopedic problems and spends most of her time sitting in a chair. She is known to have widespread coronary atherosclerosis but without significant stenoses and large vessels. She has previous undergone a left carotid endarterectomy and has hypertension and type 2 diabetes mellitus. She has no cardiac complaints other than lack of energy. Interrogation of her pacemaker shows normal device function with 100% ventricular pacing and rare atrial pacing and good heart rate histogram distribution. They have been no meaningful episodes of atrial or ventricular tachyarrhythmia and lead parameters are excellent. Estimated battery longevity is  3-4  Years. No pacemaker setting changes were necessary today.  Today , there is no intrinsic AV conduction and there is an idioventricular escape rhythm in the 30s. She should be considered pacemaker dependent.    Past Medical History  Diagnosis Date  . MRSA infection 2007    X 2  . CAD (coronary artery disease)   . CHF (congestive heart failure)   . OAB (overactive bladder)   . Diabetes mellitus 2004    type 2  . Osteoarthritis   . Hyperlipidemia   . Hypertension   . Nephrolithiasis   . DDD (degenerative disc disease), lumbar   . CHB (complete heart block)     Permanent pacemaker- St.Jude  .  Coronary artery stenosis     Past Surgical History  Procedure Laterality Date  . Pacemaker insertion  05/13/2004    St[employer]Jude  . Appendectomy    . Abdominal hysterectomy    . Cholecystectomy    . Back surgery      X 2  . Cea  4-07  . Left hip replacement  6-07  . Kidney stone removal    . Lumbar fusion    . Cataract surgery    . Ankle surgery    . Cardiac catheterization  09/01/2004    diffuse small vessel disease w/large vessel 70% mid LAD stenosis  . US echocardiography  08/06/2008    Grade I diastolic dysfunction,trace TR,ca+ MV  . Nm myocar perf wall motion  08/18/2008    normal  . Skin cancer excision  03/2014    right chest area     Current Outpatient Prescriptions  Medication Sig Dispense Refill  . acetaminophen (TYLENOL) 325 MG tablet Take 650 mg by mouth every 6 (six) hours as needed.      . ALPRAZolam (XANAX) 0.5 MG tablet Take 0.5 mg by mouth 2 (two) times daily as needed.      Marland Kitchen aluminum & magnesium hydroxide-simethicone (MYLANTA) 500-450-40 MG/5ML suspension Take 30 mLs by mouth every 4 (four) hours as needed.      Marland Kitchen aspirin 81 MG tablet Take 81 mg by mouth daily.      . bisacodyl (DULCOLAX) 10 MG suppository Place 10 mg  rectally as needed.      Marland Kitchen buPROPion (WELLBUTRIN SR) 200 MG 12 hr tablet Take 200 mg by mouth 2 (two) times daily.      . calcium-vitamin D (OSCAL WITH D) 500-200 MG-UNIT per tablet Take 1 tablet by mouth daily.      Marland Kitchen docusate sodium (COLACE) 100 MG capsule Take 100 mg by mouth 2 (two) times daily.      Marland Kitchen doxepin (SINEQUAN) 75 MG capsule Take 75 mg by mouth at bedtime.    Marland Kitchen esomeprazole (NEXIUM) 40 MG capsule Take 40 mg by mouth 2 (two) times daily.      Marland Kitchen estradiol (ESTRACE) 0.1 MG/GM vaginal cream Use vaginally three times weekly @@ qhs     . furosemide (LASIX) 20 MG tablet Take 1 tab po every other day.     . guaifenesin (TUSSIN) 100 MG/5ML syrup 100 mg. 5 mls po q 4 hours prn cough     . HYDROcodone-acetaminophen (VICODIN) 5-500 MG per tablet  Take 1 tablet by mouth every 6 (six) hours as needed.      . loratadine (CLARITIN) 10 MG tablet Take 10 mg by mouth daily.      . meclizine (ANTIVERT) 12.5 MG tablet Take 12.5 mg by mouth daily as needed.      . meloxicam (MOBIC) 7.5 MG tablet Take 7.5 mg by mouth daily as needed for pain.    . metFORMIN (GLUCOPHAGE) 500 MG tablet Take 250 mg by mouth daily with breakfast.    . nitroGLYCERIN (NITROSTAT) 0.4 MG SL tablet Place 0.4 mg under the tongue every 5 (five) minutes as needed.      . ondansetron (ZOFRAN) 8 MG tablet Take by mouth every 8 (eight) hours as needed.      Marland Kitchen oxybutynin (DITROPAN-XL) 10 MG 24 hr tablet Take 10 mg by mouth daily.      . phenylephrine-shark liver oil-mineral oil-petrolatum (PREPARATION H) 0.25-3-14-71.9 % rectal ointment Place rectally 2 (two) times daily as needed.      . polyethylene glycol (MIRALAX / GLYCOLAX) packet Take 1 capful in 8 oz of liquid and drink everyday. (hold for diarrhea)     . potassium chloride (KLOR-CON) 10 MEQ CR tablet Take 10 mEq by mouth daily.      . promethazine (PHENERGAN) 12.5 MG suppository Place 12.5 mg rectally every 6 (six) hours as needed.      . simethicone (GAS-X) 80 MG chewable tablet Chew 80 mg by mouth every 4 (four) hours.      . sitaGLIPtan (JANUVIA) 50 MG tablet Take 100 mg by mouth daily.      No current facility-administered medications for this visit.    Allergies:   Codeine sulfate and Prevacid    Social History:  The patient  reports that she has never smoked. She does not have any smokeless tobacco history on file. She reports that she does not drink alcohol.   Family History:  The patient's    family history includes Cancer in her father and sister; Diabetes in her mother; Heart attack in her mother; Hypertension in her mother.    ROS:  Please see the history of present illness.    Otherwise, review of systems positive for none.   All other systems are reviewed and negative.    PHYSICAL EXAM: VS:  BP  114/60 mmHg  Pulse 73  Ht 5\' 3"  (1.6 m)  Wt 168 lb 14.4 oz (76.613 kg)  BMI 29.93 kg/m2 , BMI Body mass index  is 29.93 kg/(m^2).  General: Alert, oriented x3, no distress Head: no evidence of trauma, PERRL, EOMI, no exophtalmos or lid lag, no myxedema, no xanthelasma; normal ears, nose and oropharynx Neck: normal jugular venous pulsations and no hepatojugular reflux; brisk carotid pulses without delay and no carotid bruits Chest: clear to auscultation, no signs of consolidation by percussion or palpation, normal fremitus, symmetrical and full respiratory excursions,  Healthy pacemaker site and left subclavian area Cardiovascular: normal position and quality of the apical impulse, regular rhythm, normal first and  Paradoxically splitsecond heart sounds, no murmurs, rubs or gallops Abdomen: no tenderness or distention, no masses by palpation, no abnormal pulsatility or arterial bruits, normal bowel sounds, no hepatosplenomegaly Extremities: no clubbing, cyanosis or edema; 2+ radial, ulnar and brachial pulses bilaterally; 2+ right femoral, posterior tibial and dorsalis pedis pulses; 2+ left femoral, posterior tibial and dorsalis pedis pulses; no subclavian or femoral bruits Neurological: grossly nonfocal Psych: euthymic mood, full affect   EKG:  EKG is ordered today. The ekg ordered today demonstrates atrial sensed ventricular paced   Recent Labs: No results found for requested labs within last 365 days.    Lipid Panel    Component Value Date/Time   CHOL 149 12/09/2010   TRIG 206* 12/09/2010   HDL 37 12/09/2010   CHOLHDL 4.0 Ratio 08/12/2009 0005   VLDL 50* 08/12/2009 0005   LDLCALC 71 12/09/2010      Wt Readings from Last 3 Encounters:  06/17/14 168 lb 14.4 oz (76.613 kg)  10/15/13 176 lb 3.2 oz (79.924 kg)  03/21/13 179 lb (81.194 kg)      ASSESSMENT AND PLAN:  Mrs. Rybacki has complete heart block and is pacemaker dependent. Pacemaker function is normal. It is not  unfortunately amenable to remote monitoring and she will have to return to the office every 6 months for full pacemaker check. When the device enters his last year of anticipated longevity will start every 3 month checks.   Current medicines are reviewed at length with the patient today.  The patient does not have concerns regarding medicines.  The following changes have been made:  no change  Labs/ tests ordered today include:  Orders Placed This Encounter  Procedures  . Implantable device check  . EKG 12-Lead   Patient Instructions  Dr. Royann Shivers recommends that you schedule a follow-up appointment in: 6 months with pacermaker check.       Joie Bimler, MD  06/18/2014 2:37 PM    Thurmon Fair, MD, [facility] [phone] office [phone] pager

## 2015-01-27 ENCOUNTER — Ambulatory Visit (INDEPENDENT_AMBULATORY_CARE_PROVIDER_SITE_OTHER): Payer: Medicare Other | Admitting: Cardiovascular Disease

## 2015-01-27 ENCOUNTER — Encounter: Payer: Self-pay | Admitting: Cardiovascular Disease

## 2015-01-27 VITALS — BP 124/82 | HR 64 | Ht 63.0 in | Wt 171.0 lb

## 2015-01-27 DIAGNOSIS — I10 Essential (primary) hypertension: Secondary | ICD-10-CM

## 2015-01-27 DIAGNOSIS — I2510 Atherosclerotic heart disease of native coronary artery without angina pectoris: Secondary | ICD-10-CM

## 2015-01-27 DIAGNOSIS — I442 Atrioventricular block, complete: Secondary | ICD-10-CM

## 2015-01-27 DIAGNOSIS — Z950 Presence of cardiac pacemaker: Secondary | ICD-10-CM

## 2015-01-27 NOTE — Progress Notes (Signed)
Patient ID: [name], female   DOB: 28-May-1923, 82 y.o.   MRN: [id number]    Cardiology Office Note    Date:  01/29/2015   ID:  [name], DOB 03-Jul-1923, MRN [id number]  PCP:  Keane Police, MD  Cardiologist:   Thurmon Fair, MD   Chief Complaint  Patient presents with  . Pacemaker Check    History of Present Illness:  [name] is a 82 y.o. female  who presents for high-grade second-degree AV block with intermittent complete heart block and pacemaker check. (pacemaker dependent).   She has dementia and lives in a nursing home. Accompanied by her daughter. She is very inactive due to orthopedic problems and spends most of her time sitting in a chair.   She has widespread coronary atherosclerosis, but without significant stenoses in large vessels. She has previous undergone a left carotid endarterectomy and has hypertension and type 2 diabetes mellitus.   She complains of feeling tired all the time, but no other CV symptoms.  Interrogation of her pacemaker shows normal device function with 100% ventricular pacing and rare atrial pacing and good heart rate histogram distribution, considering how sedentary she is. They have been no meaningful episodes of atrial or ventricular tachyarrhythmia and lead parameters are excellent. Estimated battery longevity is2.5-3.5 Years. No pacemaker setting changes were necessary today.Today, there is no intrinsic AV conduction, but there is an idioventricular escape rhythm at 32 bpm.   Past Medical History  Diagnosis Date  . MRSA infection 2007    X 2  . CAD (coronary artery disease)   . CHF (congestive heart failure) (HCC)   . OAB (overactive bladder)   . Diabetes mellitus 2004    type 2  . Osteoarthritis   . Hyperlipidemia   . Hypertension   . Nephrolithiasis   . DDD (degenerative disc disease), lumbar   . CHB (complete heart block) (HCC)     Permanent pacemaker- St.Jude  . Coronary artery stenosis      Past Surgical History  Procedure Laterality Date  . Pacemaker insertion  05/13/2004    St.Jude  . Appendectomy    . Abdominal hysterectomy    . Cholecystectomy    . Back surgery      X 2  . Cea  4-07  . Left hip replacement  6-07  . Kidney stone removal    . Lumbar fusion    . Cataract surgery    . Ankle surgery    . Cardiac catheterization  09/01/2004    diffuse small vessel disease w/large vessel 70% mid LAD stenosis  . US echocardiography  08/06/2008    Grade I diastolic dysfunction,trace TR,ca+ MV  . Nm myocar perf wall motion  08/18/2008    normal  . Skin cancer excision  03/2014    right chest area    Outpatient Prescriptions Prior to Visit  Medication Sig Dispense Refill  . acetaminophen (TYLENOL) 325 MG tablet Take 650 mg by mouth every 6 (six) hours as needed.      . ALPRAZolam (XANAX) 0.5 MG tablet Take 0.5 mg by mouth 2 (two) times daily as needed.      Marland Kitchen aluminum & magnesium hydroxide-simethicone (MYLANTA) 500-450-40 MG/5ML suspension Take 30 mLs by mouth every 4 (four) hours as needed.      Marland Kitchen aspirin 81 MG tablet Take 81 mg by mouth daily.      . bisacodyl (DULCOLAX) 10 MG suppository Place 10 mg rectally as needed.      Marland Kitchen  buPROPion (WELLBUTRIN SR) 200 MG 12 hr tablet Take 200 mg by mouth 2 (two) times daily.      . calcium-vitamin D (OSCAL WITH D) 500-200 MG-UNIT per tablet Take 1 tablet by mouth daily.      Marland Kitchen docusate sodium (COLACE) 100 MG capsule Take 100 mg by mouth 2 (two) times daily.      Marland Kitchen doxepin (SINEQUAN) 75 MG capsule Take 75 mg by mouth at bedtime.    Marland Kitchen esomeprazole (NEXIUM) 40 MG capsule Take 40 mg by mouth 2 (two) times daily.      Marland Kitchen estradiol (ESTRACE) 0.1 MG/GM vaginal cream Use vaginally three times weekly @@ qhs     . furosemide (LASIX) 20 MG tablet Take 1 tab po every other day.     . guaifenesin (TUSSIN) 100 MG/5ML syrup 100 mg. 5 mls po q 4 hours prn cough     . HYDROcodone-acetaminophen (VICODIN) 5-500 MG per tablet Take 1 tablet by mouth  every 6 (six) hours as needed.      . loratadine (CLARITIN) 10 MG tablet Take 10 mg by mouth daily.      . meloxicam (MOBIC) 7.5 MG tablet Take 7.5 mg by mouth daily as needed for pain.    . metFORMIN (GLUCOPHAGE) 500 MG tablet Take 250 mg by mouth daily with breakfast.    . nitroGLYCERIN (NITROSTAT) 0.4 MG SL tablet Place 0.4 mg under the tongue every 5 (five) minutes as needed.      Marland Kitchen oxybutynin (DITROPAN-XL) 10 MG 24 hr tablet Take 10 mg by mouth daily.      . phenylephrine-shark liver oil-mineral oil-petrolatum (PREPARATION H) 0.25-3-14-71.9 % rectal ointment Place rectally 2 (two) times daily as needed.      . polyethylene glycol (MIRALAX / GLYCOLAX) packet Take 1 capful in 8 oz of liquid and drink everyday. (hold for diarrhea)     . potassium chloride (KLOR-CON) 10 MEQ CR tablet Take 10 mEq by mouth daily.      . simethicone (GAS-X) 80 MG chewable tablet Chew 80 mg by mouth every 4 (four) hours.      . sitaGLIPtan (JANUVIA) 50 MG tablet Take 100 mg by mouth daily.     . meclizine (ANTIVERT) 12.5 MG tablet Take 12.5 mg by mouth daily as needed.      . ondansetron (ZOFRAN) 8 MG tablet Take by mouth every 8 (eight) hours as needed.      . promethazine (PHENERGAN) 12.5 MG suppository Place 12.5 mg rectally every 6 (six) hours as needed.       No facility-administered medications prior to visit.     Allergies:   Codeine sulfate and Prevacid   Social History   Social History  . Marital Status: Widowed    Spouse Name: N/A  . Number of Children: N/A  . Years of Education: N/A   Social History Main Topics  . Smoking status: Never Smoker   . Smokeless tobacco: None  . Alcohol Use: No  . Drug Use: None  . Sexual Activity: Not Asked   Other Topics Concern  . None   Social History Narrative     Family History:  The patient's family history includes Cancer in her father and sister; Diabetes in her mother; Heart attack in her mother; Hypertension in her mother.   ROS:   Please see  the history of present illness.    ROS All other systems reviewed and are negative.   PHYSICAL EXAM:   VS:  BP  124/82 mmHg  Pulse 64  Ht  (1.6 m)  Wt 171 lb (77.565 kg)  BMI 30.30 kg/m2   GEN: [name] nourished, [name] developed, in no acute distress HEENT: normal Neck: no JVD, carotid bruits, or masses Cardiac: RRR; no murmurs, rubs, or gallops,no edema , healthy PM site Respiratory:  clear to auscultation bilaterally, normal work of breathing GI: soft, nontender, nondistended, + BS MS: no deformity or atrophy Skin: warm and dry, no rash Neuro:  Alert and Oriented x 3, Strength and sensation are intact Psych: euthymic mood, full affect  Wt Readings from Last 3 Encounters:  01/27/15 171 lb (77.565 kg)  06/17/14 168 lb 14.4 oz (76.613 kg)  10/15/13 176 lb 3.2 oz (79.924 kg)      Studies/Labs Reviewed:   EKG:  EKG is ordered today.  The ekg ordered today demonstrates A sensed, V paced rhythm  Recent Labs: No results found for requested labs within last 365 days.   Lipid Panel    Component Value Date/Time   CHOL 149 12/09/2010   TRIG 206* 12/09/2010   HDL 37 12/09/2010   CHOLHDL 4.0 Ratio 08/12/2009 0005   VLDL 50* 08/12/2009 0005   LDLCALC 71 12/09/2010     ASSESSMENT:    1. CHB (complete heart block) (HCC)   2. HYPERTENSION, BENIGN ESSENTIAL   3. Pacemaker - St. Jude, non RF dual chamber, 2006   4. Atherosclerosis of native coronary artery of native heart without angina pectoris      PLAN:  In order of problems listed above:  1. PPM dependent 2. Good BP control 3. Normal device function, not amenable to remote monitoring. Office check Q 6 months. Increase to Q3 months when she is in last year of battery life 4. Angina free. On statin    Medication Adjustments/Labs and Tests Ordered: Current medicines are reviewed at length with the patient today.  Concerns regarding medicines are outlined above.  Medication changes, Labs and Tests ordered today are  listed in the Patient Instructions below. Patient Instructions  Dr. Royann Shivers recommends that you schedule a follow-up appointment in: 6 MONTHS WITH PACEMAKER CHECK ([employer] JUDE)     Joie Bimler, MD  01/29/2015 12:37 PM    [facility] [address] [place], [place], [place]  [address] Phone: [phone]; Fax: [phone]

## 2015-01-27 NOTE — Patient Instructions (Signed)
Dr. Croitoru recommends that you schedule a follow-up appointment in: 6 MONTHS WITH PACEMAKER CHECK (ST JUDE)   

## 2015-03-03 LAB — CUP PACEART INCLINIC DEVICE CHECK
Date Time Interrogation Session: 20170228110810
Implantable Lead Implant Date: 20060511
Implantable Lead Location: 753859
MDC IDC LEAD IMPLANT DT: 20060511
MDC IDC LEAD LOCATION: 753860
MDC IDC PG SERIAL: 1369928
Pulse Gen Model: 5356

## 2015-03-05 ENCOUNTER — Encounter: Payer: Self-pay | Admitting: Cardiovascular Disease

## 2015-04-06 ENCOUNTER — Encounter: Payer: Self-pay | Admitting: Pediatrics

## 2015-07-27 NOTE — Progress Notes (Signed)
Patient ID: ARMETTA HENRI, female   DOB: [birth year], 82 y.o.   MRN: [id number]    Cardiology Office Note    Date:  08/04/2015   ID:  [name], DOB 1923/02/01, MRN [id number]  PCP:  Keane Police, MD  Cardiologist:   Thurmon Fair, MD   Chief Complaint  Patient presents with  . Follow-up    History of Present Illness:  [name] is a 82 y.o. female  who presents for high-grade second-degree AV block with intermittent complete heart block and pacemaker check. (pacemaker dependent).   She has dementia and lives in a nursing home. Accompanied by her daughter. She is very inactive due to orthopedic problems and spends most of her time sitting in a chair. She is able to transfer from a wheelchair. She is remarkably interactive today holding a fairly spirited conversation and even showing some humor. This is different from previous interactions where she rarely spoke.  She has widespread coronary atherosclerosis, but without significant stenoses in large vessels. She has previous undergone a left carotid endarterectomy and has hypertension and type 2 diabetes mellitus. A recent cholesterol profile was excellent, but the hemoglobin A1c was elevated at 8%  She complains of feeling tired all the time, but no other CV symptoms.  Interrogation of her pacemaker shows normal device function with 100% ventricular pacing and 24% atrial pacing and good heart rate histogram distribution, considering how sedentary she is. They have been no episodes of atrial or ventricular tachyarrhythmia and lead parameters are excellent. Estimated battery longevity is2-3 Years. No pacemaker setting changes were necessary today.Today, there is no intrinsic AV conduction, but there is an idioventricular escape rhythm at 30-35 bpm.   Past Medical History:  Diagnosis Date  . CAD (coronary artery disease)   . CHB (complete heart block) (HCC)    Permanent pacemaker- St.Jude  . CHF (congestive  heart failure) (HCC)   . Coronary artery stenosis   . DDD (degenerative disc disease), lumbar   . Diabetes mellitus 2004   type 2  . Hyperlipidemia   . Hypertension   . MRSA infection 2007   X 2  . Nephrolithiasis   . OAB (overactive bladder)   . Osteoarthritis     Past Surgical History:  Procedure Laterality Date  . ABDOMINAL HYSTERECTOMY    . ANKLE SURGERY    . APPENDECTOMY    . BACK SURGERY     X 2  . CARDIAC CATHETERIZATION  09/01/2004   diffuse small vessel disease w/large vessel 70% mid LAD stenosis  . cataract surgery    . CEA  4-07  . CHOLECYSTECTOMY    . kidney stone removal    . Left hip replacement  6-07  . LUMBAR FUSION    . NM MYOCAR PERF WALL MOTION  08/18/2008   normal  . PACEMAKER INSERTION  05/13/2004   St.Jude  . SKIN CANCER EXCISION  03/2014   right chest area  . US ECHOCARDIOGRAPHY  08/06/2008   Grade I diastolic dysfunction,trace TR,ca+ MV    Outpatient Medications Prior to Visit  Medication Sig Dispense Refill  . acetaminophen (TYLENOL) 325 MG tablet Take 650 mg by mouth every 6 (six) hours as needed.      . ALPRAZolam (XANAX) 0.5 MG tablet Take 0.5 mg by mouth 2 (two) times daily as needed.      Marland Kitchen aluminum & magnesium hydroxide-simethicone (MYLANTA) 500-450-40 MG/5ML suspension Take 30 mLs by mouth every 4 (four) hours as needed.      Marland Kitchen  aspirin 81 MG tablet Take 81 mg by mouth daily.      . bisacodyl (DULCOLAX) 10 MG suppository Place 10 mg rectally as needed.      Marland Kitchen buPROPion (WELLBUTRIN SR) 200 MG 12 hr tablet Take 200 mg by mouth 2 (two) times daily.      . calcium-vitamin D (OSCAL WITH D) 500-200 MG-UNIT per tablet Take 1 tablet by mouth daily.      Marland Kitchen docusate sodium (COLACE) 100 MG capsule Take 100 mg by mouth 2 (two) times daily.      Marland Kitchen doxepin (SINEQUAN) 75 MG capsule Take 75 mg by mouth at bedtime.    Marland Kitchen esomeprazole (NEXIUM) 40 MG capsule Take 40 mg by mouth 2 (two) times daily.      Marland Kitchen estradiol (ESTRACE) 0.1 MG/GM vaginal cream Use  vaginally three times weekly @@ qhs     . furosemide (LASIX) 20 MG tablet Take 1 tab po every other day.     . gabapentin (NEURONTIN) 100 MG capsule Take 100 mg by mouth 2 (two) times daily. Take 2 tablet (200mg ) twice daily    . guaifenesin (TUSSIN) 100 MG/5ML syrup 100 mg. 5 mls po q 4 hours prn cough     . HYDROcodone-acetaminophen (VICODIN) 5-500 MG per tablet Take 1 tablet by mouth every 6 (six) hours as needed.      Marland Kitchen lisinopril (PRINIVIL,ZESTRIL) 5 MG tablet Take 5 mg by mouth daily.    Marland Kitchen loratadine (CLARITIN) 10 MG tablet Take 10 mg by mouth daily.      . meloxicam (MOBIC) 7.5 MG tablet Take 7.5 mg by mouth daily as needed for pain.    . metFORMIN (GLUCOPHAGE) 500 MG tablet Take 250 mg by mouth daily with breakfast.    . nitroGLYCERIN (NITROSTAT) 0.4 MG SL tablet Place 0.4 mg under the tongue every 5 (five) minutes as needed.      Marland Kitchen oxybutynin (DITROPAN-XL) 10 MG 24 hr tablet Take 10 mg by mouth daily.      . phenylephrine-shark liver oil-mineral oil-petrolatum (PREPARATION H) 0.25-3-14-71.9 % rectal ointment Place rectally 2 (two) times daily as needed.      . polyethylene glycol (MIRALAX / GLYCOLAX) packet Take 1 capful in 8 oz of liquid and drink everyday. (hold for diarrhea)     . potassium chloride (KLOR-CON) 10 MEQ CR tablet Take 10 mEq by mouth daily.      . simethicone (GAS-X) 80 MG chewable tablet Chew 80 mg by mouth every 4 (four) hours.      . simvastatin (ZOCOR) 10 MG tablet Take 10 mg by mouth daily.    . sitaGLIPtin (JANUVIA) 100 MG tablet Take 100 mg by mouth daily.     No facility-administered medications prior to visit.      Allergies:   Codeine sulfate and Prevacid [lansoprazole]   Social History   Social History  . Marital status: Widowed    Spouse name: N/A  . Number of children: N/A  . Years of education: N/A   Social History Main Topics  . Smoking status: Never Smoker  . Smokeless tobacco: None  . Alcohol use No  . Drug use: Unknown  . Sexual  activity: Not Asked   Other Topics Concern  . None   Social History Narrative  . None     Family History:  The patient's family history includes Cancer in her father and sister; Diabetes in her mother; Heart attack in her mother; Hypertension in her mother.  ROS:   Please see the history of present illness.    ROS All other systems reviewed and are negative.   PHYSICAL EXAM:   VS:  BP (!) 110/58 (BP Location: Left Arm, Patient Position: Sitting, Cuff Size: Normal)   Pulse 84   Ht 5\' 3"  (1.6 m)   Wt 77.1 kg (170 lb)   BMI 30.11 kg/m    GEN: Well nourished, well developed, in no acute distress  HEENT: normal  Neck: no JVD, carotid bruits, or masses Cardiac: RRR; no murmurs, rubs, or gallops,no edema , healthy PM site Respiratory:  clear to auscultation bilaterally, normal work of breathing GI: soft, nontender, nondistended, + BS MS: no deformity or atrophy  Skin: warm and dry, no rash Neuro:  Alert and Oriented x 3, Strength and sensation are intact Psych: euthymic mood, full affect  Wt Readings from Last 3 Encounters:  08/04/15 77.1 kg (170 lb)  01/27/15 77.6 kg (171 lb)  06/17/14 76.6 kg (168 lb 14.4 oz)      Studies/Labs Reviewed:   EKG:  EKG is ordered today.  The ekg ordered today demonstrates A sensed, V paced rhythm. QTc 496 ms.  Recent Labs: June 04, 2015, glucose 216, A1c 8%, creatinine 0.85, normal LFTs  Lipid Panel June 04, 2015 total cholesterol 179, triglycerides 185, HDL 54, LDL 88  ASSESSMENT:    1. CHB (complete heart block) (HCC)   2. HYPERTENSION, BENIGN ESSENTIAL   3. Pacemaker - St. Jude, non RF dual chamber, 2006   4. Atherosclerosis of native coronary artery of native heart without angina pectoris   5. Carotid stenosis, left   6.      DM   PLAN:  In order of problems listed above:  1. 3rd deg AVBlock: PPM dependent. Normal device function. Device unable to perform remote downloads. Will continue every six-month monitoring, planning  to increased every 3 months in the last anticipated year of generator longevity. 2. HTN: Good BP control 3. PPM: Normal device function, not amenable to remote monitoring. Office check Q 6 months. Increase to Q3 months when she is in last year of battery life 4. CAD: Angina free. On statin with an excellent lipid profile. 5. Carotid stenosis: Left carotid endarterectomy site patent by Duplex US 2013. No routine monitoring due to advanced age and memory problems. 6. DM: Suboptimal control. She likes frosties and strawberry ice cream.    Medication Adjustments/Labs and Tests Ordered: Current medicines are reviewed at length with the patient today.  Concerns regarding medicines are outlined above.  Medication changes, Labs and Tests ordered today are listed in the Patient Instructions below. Patient Instructions  Dr Royann Shivers recommends that you schedule a follow-up appointment in 6 months with a pacemaker check. You will receive a reminder letter in the mail two months in advance. If you don't receive a letter, please call our office to schedule the follow-up appointment.  If you need a refill on your cardiac medications before your next appointment, please call your pharmacy.   Signed, Thurmon Fair, MD  08/04/2015 10:45 AM    [facility] [address] [place], [place], [place]  [address] Phone: [phone]; Fax: [phone]

## 2015-08-04 ENCOUNTER — Ambulatory Visit (INDEPENDENT_AMBULATORY_CARE_PROVIDER_SITE_OTHER): Payer: Medicare Other | Admitting: Cardiovascular Disease

## 2015-08-04 ENCOUNTER — Encounter: Payer: Self-pay | Admitting: Cardiovascular Disease

## 2015-08-04 VITALS — BP 110/58 | HR 84 | Ht 63.0 in | Wt 170.0 lb

## 2015-08-04 DIAGNOSIS — I2510 Atherosclerotic heart disease of native coronary artery without angina pectoris: Secondary | ICD-10-CM | POA: Diagnosis not present

## 2015-08-04 DIAGNOSIS — I10 Essential (primary) hypertension: Secondary | ICD-10-CM | POA: Diagnosis not present

## 2015-08-04 DIAGNOSIS — Z950 Presence of cardiac pacemaker: Secondary | ICD-10-CM | POA: Diagnosis not present

## 2015-08-04 DIAGNOSIS — I442 Atrioventricular block, complete: Secondary | ICD-10-CM | POA: Diagnosis not present

## 2015-08-04 DIAGNOSIS — E1165 Type 2 diabetes mellitus with hyperglycemia: Secondary | ICD-10-CM

## 2015-08-04 DIAGNOSIS — I6522 Occlusion and stenosis of left carotid artery: Secondary | ICD-10-CM

## 2015-08-04 DIAGNOSIS — IMO0001 Reserved for inherently not codable concepts without codable children: Secondary | ICD-10-CM

## 2015-08-04 LAB — CUP PACEART INCLINIC DEVICE CHECK
Battery Impedance: 3800 Ohm
Implantable Lead Implant Date: 20060511
Implantable Lead Location: 753860
Lead Channel Impedance Value: 473 Ohm
Lead Channel Impedance Value: 515 Ohm
Lead Channel Pacing Threshold Pulse Width: 0.5 ms
Lead Channel Setting Sensing Sensitivity: 2 mV
MDC IDC LEAD IMPLANT DT: 20060511
MDC IDC LEAD LOCATION: 753859
MDC IDC MSMT BATTERY VOLTAGE: 2.74 V
MDC IDC MSMT LEADCHNL RA PACING THRESHOLD AMPLITUDE: 0.75 V
MDC IDC MSMT LEADCHNL RA SENSING INTR AMPL: 1.6 mV
MDC IDC MSMT LEADCHNL RV PACING THRESHOLD AMPLITUDE: 0.625 V
MDC IDC MSMT LEADCHNL RV PACING THRESHOLD PULSEWIDTH: 0.5 ms
MDC IDC SESS DTM: 20170801142505
MDC IDC SET LEADCHNL RA PACING AMPLITUDE: 2 V
MDC IDC SET LEADCHNL RV PACING PULSEWIDTH: 0.5 ms
Pulse Gen Serial Number: 1369928

## 2015-08-04 NOTE — Patient Instructions (Signed)
Dr Croitoru recommends that you schedule a follow-up appointment in 6 months with a pacemaker check. You will receive a reminder letter in the mail two months in advance. If you don't receive a letter, please call our office to schedule the follow-up appointment.  If you need a refill on your cardiac medications before your next appointment, please call your pharmacy. 

## 2015-08-06 ENCOUNTER — Encounter: Payer: Self-pay | Admitting: Cardiovascular Disease

## 2015-10-05 ENCOUNTER — Emergency Department (HOSPITAL_BASED_OUTPATIENT_CLINIC_OR_DEPARTMENT_OTHER)
Admission: EM | Admit: 2015-10-05 | Discharge: 2015-10-05 | Disposition: A | Discharge status: Home / Self Care | Payer: Medicare Other | Attending: Emergency Medicine | Admitting: Emergency Medicine

## 2015-10-05 ENCOUNTER — Encounter (HOSPITAL_BASED_OUTPATIENT_CLINIC_OR_DEPARTMENT_OTHER): Payer: Self-pay | Admitting: Emergency Medicine

## 2015-10-05 DIAGNOSIS — Z7982 Long term (current) use of aspirin: Secondary | ICD-10-CM | POA: Diagnosis not present

## 2015-10-05 DIAGNOSIS — I110 Hypertensive heart disease with heart failure: Secondary | ICD-10-CM | POA: Diagnosis not present

## 2015-10-05 DIAGNOSIS — K6289 Other specified diseases of anus and rectum: Secondary | ICD-10-CM | POA: Diagnosis present

## 2015-10-05 DIAGNOSIS — K5909 Other constipation: Secondary | ICD-10-CM | POA: Insufficient documentation

## 2015-10-05 DIAGNOSIS — K5641 Fecal impaction: Secondary | ICD-10-CM

## 2015-10-05 DIAGNOSIS — E119 Type 2 diabetes mellitus without complications: Secondary | ICD-10-CM | POA: Diagnosis not present

## 2015-10-05 DIAGNOSIS — Z79899 Other long term (current) drug therapy: Secondary | ICD-10-CM | POA: Insufficient documentation

## 2015-10-05 DIAGNOSIS — Z7984 Long term (current) use of oral hypoglycemic drugs: Secondary | ICD-10-CM | POA: Diagnosis not present

## 2015-10-05 DIAGNOSIS — I509 Heart failure, unspecified: Secondary | ICD-10-CM | POA: Diagnosis not present

## 2015-10-05 DIAGNOSIS — I2510 Atherosclerotic heart disease of native coronary artery without angina pectoris: Secondary | ICD-10-CM | POA: Insufficient documentation

## 2015-10-05 MED ORDER — POLYETHYLENE GLYCOL 3350 17 G PO PACK: 51.0000 g | PACK | Freq: Every day | ORAL | 1 refills | Status: AC

## 2015-10-05 MED ORDER — GLYCERIN (ADULT) 2 G RE SUPP: 1.0000 | RECTAL | 0 refills | Status: DC | PRN

## 2015-10-05 MED ORDER — MINERAL OIL RE ENEM
1.0000 | ENEMA | Freq: Once | RECTAL | Status: AC
  Administered 2015-10-05: 1 via RECTAL
  Filled 2015-10-05: qty 1

## 2015-10-05 NOTE — ED Notes (Signed)
MD at bedside. 

## 2015-10-05 NOTE — ED Triage Notes (Signed)
Per daughter patient fell 2 weeks ago and bruised coccyx.  Reports since then she has not had regular bowel movements.  Patient states she did have a small bowel movement yesterday with hard stool.  Daughter reports patient also taking vicodin for pain.  Patient reports she feels like she is "plugged up".  States she doesn't have the strength to evacuate stool.

## 2015-10-05 NOTE — ED Notes (Signed)
No results from second enema at this time.

## 2015-10-05 NOTE — ED Notes (Signed)
No results from enema at this time; MD aware. V.O. To try another enema if no results soon.

## 2015-10-05 NOTE — ED Provider Notes (Signed)
[facility] Note   CSN: [id number] Arrival date & time: 10/05/15  0957     History   Chief Complaint Chief Complaint  Patient presents with  . Constipation    HPI [name] is a 82 y.o. female.  The history is provided by the patient and a relative.  Constipation   This is a new problem. The current episode started 2 days ago. The stool is described as firm. Pertinent negatives include no abdominal pain, no flatus and no dysuria. Associated symptoms comments: Rectal pain.  No bloody stool. No nausea, vomiting. By mouth intake has been adequate. She does not exercise regularly. There has been adequate water intake. Treatments tried: MiraLAX and Colace. The treatment provided no relief. Risk factors include immobility (2 weeks ago patient fell and fractured her coccyx. She now does not have the strength to expel her stool).    Past Medical History:  Diagnosis Date  . CAD (coronary artery disease)   . CHB (complete heart block) (HCC)    Permanent pacemaker- St.Jude  . CHF (congestive heart failure) (HCC)   . Coronary artery stenosis   . DDD (degenerative disc disease), lumbar   . Diabetes mellitus 2004   type 2  . Hyperlipidemia   . Hypertension   . MRSA infection 2007   X 2  . Nephrolithiasis   . OAB (overactive bladder)   . Osteoarthritis     Patient Active Problem List   Diagnosis Date Noted  . CHB (complete heart block) (HCC) 10/15/2013  . Second degree AV block, Mobitz type II 10/15/2013  . Carotid stenosis 09/29/2012  . Pacemaker - St. Jude, non RF dual chamber, 2006 03/08/2011  . GERD 12/17/2008  . CONSTIPATION, CHRONIC 12/17/2008  . LEG EDEMA, BILATERAL 07/23/2007  . DEPRESSION 02/26/2007  . CHF 02/26/2007  . HIATAL HERNIA 02/26/2007  . IBS 02/26/2007  . ARTHRITIS 02/26/2007  . MUSCLE WEAKNESS (GENERALIZED) 01/18/2007  . INSOMNIA 12/18/2006  . DYSPEPSIA 10/16/2006  . DISC DISEASE, CERVICAL 08/14/2006  . CHRONIC KIDNEY  DISEASE UNSPECIFIED 06/13/2006  . LUMBAGO 06/12/2006  . OVERACTIVE BLADDER 03/08/2006  . ANXIETY STATE NOS 02/08/2006  . WEAKNESS 01/11/2006  . HYPERTENSION, BENIGN ESSENTIAL 12/13/2005  . Diabetes mellitus type 2, uncontrolled (HCC) 11/15/2005  . Coronary atherosclerosis 11/15/2005  . BENIGN POSITIONAL VERTIGO 11/04/2005    Past Surgical History:  Procedure Laterality Date  . ABDOMINAL HYSTERECTOMY    . ANKLE SURGERY    . APPENDECTOMY    . BACK SURGERY     X 2  . CARDIAC CATHETERIZATION  09/01/2004   diffuse small vessel disease w/large vessel 70% mid LAD stenosis  . cataract surgery    . CEA  4-07  . CHOLECYSTECTOMY    . kidney stone removal    . Left hip replacement  6-07  . LUMBAR FUSION    . NM MYOCAR PERF WALL MOTION  08/18/2008   normal  . PACEMAKER INSERTION  05/13/2004   St.Jude  . SKIN CANCER EXCISION  03/2014   right chest area  . US ECHOCARDIOGRAPHY  08/06/2008   Grade I diastolic dysfunction,trace TR,ca+ MV    OB History    No data available       Home Medications    Prior to Admission medications   Medication Sig Start Date End Date Taking? Authorizing Provider  nitrofurantoin, macrocrystal-monohydrate, (MACROBID) 100 MG capsule Take 100 mg by mouth daily.   Yes Historical Provider, MD  acetaminophen (TYLENOL) 325 MG tablet  Take 650 mg by mouth every 6 (six) hours as needed.      Historical Provider, MD  ALPRAZolam Prudy Feeler) 0.5 MG tablet Take 0.5 mg by mouth 2 (two) times daily as needed.      Historical Provider, MD  aluminum & magnesium hydroxide-simethicone (MYLANTA) 500-450-40 MG/5ML suspension Take 30 mLs by mouth every 4 (four) hours as needed.      Historical Provider, MD  aspirin 81 MG tablet Take 81 mg by mouth daily.      Historical Provider, MD  bisacodyl (DULCOLAX) 10 MG suppository Place 10 mg rectally as needed.      Historical Provider, MD  buPROPion (WELLBUTRIN SR) 200 MG 12 hr tablet Take 200 mg by mouth 2 (two) times daily.      Historical  Provider, MD  calcium-vitamin D (OSCAL WITH D) 500-200 MG-UNIT per tablet Take 1 tablet by mouth daily.      Historical Provider, MD  docusate sodium (COLACE) 100 MG capsule Take 100 mg by mouth 2 (two) times daily.      Historical Provider, MD  doxepin (SINEQUAN) 75 MG capsule Take 75 mg by mouth at bedtime.    Historical Provider, MD  esomeprazole (NEXIUM) 40 MG capsule Take 40 mg by mouth 2 (two) times daily.      Historical Provider, MD  estradiol (ESTRACE) 0.1 MG/GM vaginal cream Use vaginally three times weekly @@ qhs     Historical Provider, MD  furosemide (LASIX) 20 MG tablet Take 1 tab po every other day.     Historical Provider, MD  gabapentin (NEURONTIN) 100 MG capsule Take 100 mg by mouth 2 (two) times daily. Take 2 tablet (200mg ) twice daily    Historical Provider, MD  guaifenesin (TUSSIN) 100 MG/5ML syrup 100 mg. 5 mls po q 4 hours prn cough     Historical Provider, MD  HYDROcodone-acetaminophen (VICODIN) 5-500 MG per tablet Take 1 tablet by mouth every 6 (six) hours as needed.      Historical Provider, MD  lisinopril (PRINIVIL,ZESTRIL) 5 MG tablet Take 5 mg by mouth daily.    Historical Provider, MD  loratadine (CLARITIN) 10 MG tablet Take 10 mg by mouth daily.      Historical Provider, MD  meloxicam (MOBIC) 7.5 MG tablet Take 7.5 mg by mouth daily as needed for pain.    Historical Provider, MD  metFORMIN (GLUCOPHAGE) 500 MG tablet Take 250 mg by mouth daily with breakfast.    Historical Provider, MD  nitroGLYCERIN (NITROSTAT) 0.4 MG SL tablet Place 0.4 mg under the tongue every 5 (five) minutes as needed.      Historical Provider, MD  oxybutynin (DITROPAN-XL) 10 MG 24 hr tablet Take 10 mg by mouth daily.      Historical Provider, MD  phenylephrine-shark liver oil-mineral oil-petrolatum (PREPARATION H) 0.25-3-14-71.9 % rectal ointment Place rectally 2 (two) times daily as needed.      Historical Provider, MD  polyethylene glycol (MIRALAX / GLYCOLAX) packet Take 1 capful in 8 oz of  liquid and drink everyday. (hold for diarrhea)     Historical Provider, MD  potassium chloride (KLOR-CON) 10 MEQ CR tablet Take 10 mEq by mouth daily.      Historical Provider, MD  simethicone (GAS-X) 80 MG chewable tablet Chew 80 mg by mouth every 4 (four) hours.      Historical Provider, MD  simvastatin (ZOCOR) 10 MG tablet Take 10 mg by mouth daily.    Historical Provider, MD  sitaGLIPtin (JANUVIA) 100 MG tablet  Take 100 mg by mouth daily.    Historical Provider, MD    Family History Family History  Problem Relation Age of Onset  . Hypertension Mother   . Diabetes Mother   . Heart attack Mother   . Cancer Father   . Cancer Sister     breast    Social History Social History  Substance Use Topics  . Smoking status: Never Smoker  . Smokeless tobacco: Never Used  . Alcohol use No     Allergies   Codeine sulfate and Prevacid [lansoprazole]   Review of Systems Review of Systems  HENT: Positive for dental problem.        Recent dental pain and issues making it more difficult to eat  Gastrointestinal: Positive for constipation. Negative for abdominal pain and flatus.  Genitourinary: Negative for dysuria.  All other systems reviewed and are negative.    Physical Exam Updated Vital Signs BP 156/96 (BP Location: Right Arm)   Pulse 86   Temp 98 F (36.7 C) (Oral)   Ht 5\' 2"  (1.575 m)   Wt 168 lb (76.2 kg)   SpO2 98%   BMI 30.73 kg/m   Physical Exam  Constitutional: She is oriented to person, place, and time. She appears well-developed and well-nourished. No distress.  HENT:  Head: Normocephalic and atraumatic.  Mouth/Throat: Oropharynx is clear and moist.  Eyes: Conjunctivae and EOM are normal. Pupils are equal, round, and reactive to light.  Neck: Normal range of motion. Neck supple.  Cardiovascular: Normal rate, regular rhythm and intact distal pulses.   No murmur heard. Pulmonary/Chest: Effort normal and breath sounds normal. No respiratory distress. She has no  wheezes. She has no rales.  Abdominal: Soft. She exhibits no distension. There is tenderness. There is no rebound and no guarding.  Minimal suprapubic tenderness  Genitourinary: Rectal exam shows no external hemorrhoid and no internal hemorrhoid.  Genitourinary Comments: Large soft stool present in the rectum but only unable to manually disimpact a small amount  Musculoskeletal: Normal range of motion. She exhibits no edema or tenderness.  Neurological: She is alert and oriented to person, place, and time.  Skin: Skin is warm and dry. No rash noted. No erythema.  Psychiatric: She has a normal mood and affect. Her behavior is normal.  Nursing note and vitals reviewed.    ED Treatments / Results  Labs (all labs ordered are listed, but only abnormal results are displayed) Labs Reviewed - No data to display  EKG  EKG Interpretation None       Radiology No results found.  Procedures Procedures (including critical care time)  Medications Ordered in ED Medications  mineral oil enema 1 enema (not administered)     Initial Impression / Assessment and Plan / ED Course  I have reviewed the triage vital signs and the nursing notes.  Pertinent labs & imaging results that were available during my care of the patient were reviewed by me and considered in my medical decision making (see chart for details).  Clinical Course   Patient is an 82 year old female with multiple medical problems presenting today with constipation. Her last bowel movement was Friday. Patient states that she feels pressure in stool in her rectum but does not have the strength to push it out. She fell 2 weeks ago and fractured her coccyx which is making it difficult for her to go to the bathroom. She continues to take MiraLAX and Colace but has not tried any enemas. She also is  complaining of rectal pain and some mild lower abdominal pain but denies any change in by mouth intake. No vomiting or fever. On exam  patient has a large stool present at fingertip in the rectum only able to remove a small amount and is very painful. Bowel sounds are within normal limits and only mild suprapubic tenderness on exam.   Will give patient an enema to see if she is able to have a bowel movement.  2:31 PM Minimal stool with enema.  Able to manual dissimpact better after enema.  Pt feeling better after dissimpaction and pressure is relieved.  No abd tenderness at this time and pt ready for d/c with increasing miralax and glycerin suppository  Final Clinical Impressions(s) / ED Diagnoses   Final diagnoses:  Other constipation  Fecal impaction in rectum [facility])    New Prescriptions New Prescriptions   GLYCERIN ADULT 2 G SUPPOSITORY    Place 1 suppository rectally as needed for constipation.     Gwyneth Sprout, MD 10/05/15 1501

## 2016-02-17 ENCOUNTER — Ambulatory Visit (INDEPENDENT_AMBULATORY_CARE_PROVIDER_SITE_OTHER): Payer: Medicare Other | Admitting: Cardiovascular Disease

## 2016-02-17 ENCOUNTER — Encounter: Payer: Self-pay | Admitting: Cardiovascular Disease

## 2016-02-17 VITALS — BP 138/78 | HR 60 | Ht 62.0 in | Wt 160.0 lb

## 2016-02-17 DIAGNOSIS — E119 Type 2 diabetes mellitus without complications: Secondary | ICD-10-CM | POA: Diagnosis not present

## 2016-02-17 DIAGNOSIS — I6522 Occlusion and stenosis of left carotid artery: Secondary | ICD-10-CM

## 2016-02-17 DIAGNOSIS — I442 Atrioventricular block, complete: Secondary | ICD-10-CM | POA: Diagnosis not present

## 2016-02-17 DIAGNOSIS — I10 Essential (primary) hypertension: Secondary | ICD-10-CM

## 2016-02-17 DIAGNOSIS — Z950 Presence of cardiac pacemaker: Secondary | ICD-10-CM | POA: Diagnosis not present

## 2016-02-17 DIAGNOSIS — I441 Atrioventricular block, second degree: Secondary | ICD-10-CM

## 2016-02-17 DIAGNOSIS — I2510 Atherosclerotic heart disease of native coronary artery without angina pectoris: Secondary | ICD-10-CM

## 2016-02-17 LAB — CUP PACEART INCLINIC DEVICE CHECK
Battery Impedance: 4700 Ohm
Battery Voltage: 2.73 V
Implantable Lead Implant Date: 20060511
Implantable Lead Location: 753859
Implantable Pulse Generator Implant Date: 20060511
Lead Channel Impedance Value: 444 Ohm
Lead Channel Impedance Value: 589 Ohm
Lead Channel Pacing Threshold Pulse Width: 0.5 ms
Lead Channel Sensing Intrinsic Amplitude: 1.7 mV
Lead Channel Setting Pacing Amplitude: 2 V
Lead Channel Setting Sensing Sensitivity: 2 mV
MDC IDC LEAD IMPLANT DT: 20060511
MDC IDC LEAD LOCATION: 753860
MDC IDC MSMT LEADCHNL RA PACING THRESHOLD AMPLITUDE: 0.75 V
MDC IDC MSMT LEADCHNL RA PACING THRESHOLD PULSEWIDTH: 1 ms
MDC IDC MSMT LEADCHNL RV PACING THRESHOLD AMPLITUDE: 0.75 V
MDC IDC MSMT LEADCHNL RV SENSING INTR AMPL: 5.8 mV
MDC IDC SESS DTM: 20180214143739
MDC IDC SET LEADCHNL RV PACING PULSEWIDTH: 0.5 ms
MDC IDC STAT BRADY RA PERCENT PACED: 7.7 %
MDC IDC STAT BRADY RV PERCENT PACED: 99 % — AB
Pulse Gen Serial Number: 1369928

## 2016-02-17 NOTE — Patient Instructions (Signed)
Dr Croitoru recommends that you schedule a follow-up appointment in 6 months with a pacemaker check. You will receive a reminder letter in the mail two months in advance. If you don't receive a letter, please call our office to schedule the follow-up appointment.  If you need a refill on your cardiac medications before your next appointment, please call your pharmacy. 

## 2016-02-17 NOTE — Progress Notes (Signed)
Patient ID: [name], female   DOB: 26-Apr-1923, 83 y.o.   MRN: [id number]    Cardiology Office Note    Date:  02/17/2016   ID:  [name], DOB Oct 19, 1923, MRN [id number]  PCP:  Keane Police, MD  Cardiologist:   Thurmon Fair, MD   Chief Complaint  Patient presents with  . Follow-up    pt c/o SOB    History of Present Illness:  [name] is a 83 y.o. female  who presents for high-grade second-degree AV block with intermittent complete heart block and pacemaker check. (pacemaker dependent).   She has dementia and lives in a nursing home. Accompanied by her daughter today, as always. She is very inactive due to orthopedic problems and spends most of her time sitting in a chair. She is able to transfer from a wheelchair. She is very interactive today holding a fairly spirited conversation, although repeating herself a lot.  He was hospitalized in in [facility] in November with acute renal insufficiency due to dehydration. Her diuretic was permanently stopped. Lisinopril was held temporarily and has been restarted. After stopping her diuretic she has had intermittent problems with ankle swelling and maybe some slight shortness of breath. She does not have orthopnea. Today her breathing is fine. Her creatinine was up to 3.5. Her most recent creatinine was back to normal. Her daughter states that she has lost 30 pounds. According to our scales she has lost only 11 pounds since last January.  She has widespread coronary atherosclerosis, but without significant stenoses in large vessels. She has previous undergone a left carotid endarterectomy and has hypertension and type 2 diabetes mellitus. A recent cholesterol profile was excellent and the hemoglobin A1c was markedly improved at 6 %  She denies angina, dyspnea, palpitations, syncope, edema, claudication, falls, focal neurological complaints.  Interrogation of her pacemaker shows normal device function with 100%  ventricular pacing and 7.7% atrial pacing and good heart rate histogram distribution, considering how sedentary she is. There has been a slight increase in atrial lead threshold and the outputs were adjusted appropriately, but this is unlikely to be a problem since she rarely requires atrial pacing They have been no episodes of atrial or ventricular tachyarrhythmia and lead parameters are excellent. Estimated battery longevity is1.5-2.5 Years.Today, there is no intrinsic AV conduction, but there is an idioventricular escape rhythm at 33 bpm.   Past Medical History:  Diagnosis Date  . CAD (coronary artery disease)   . CHB (complete heart block) (HCC)    Permanent pacemaker- [employer]  . CHF (congestive heart failure) (HCC)   . Coronary artery stenosis   . DDD (degenerative disc disease), lumbar   . Diabetes mellitus 2004   type 2  . Hyperlipidemia   . Hypertension   . MRSA infection 2007   X 2  . Nephrolithiasis   . OAB (overactive bladder)   . Osteoarthritis     Past Surgical History:  Procedure Laterality Date  . ABDOMINAL HYSTERECTOMY    . ANKLE SURGERY    . APPENDECTOMY    . BACK SURGERY     X 2  . CARDIAC CATHETERIZATION  09/01/2004   diffuse small vessel disease w/large vessel 70% mid LAD stenosis  . cataract surgery    . CEA  4-07  . CHOLECYSTECTOMY    . kidney stone removal    . Left hip replacement  6-07  . LUMBAR FUSION    . NM MYOCAR PERF WALL MOTION  08/18/2008  normal  . PACEMAKER INSERTION  05/13/2004   [employer]  . SKIN CANCER EXCISION  03/2014   right chest area  . US ECHOCARDIOGRAPHY  08/06/2008   Grade I diastolic dysfunction,trace TR,ca+ MV    Outpatient Medications Prior to Visit  Medication Sig Dispense Refill  . acetaminophen (TYLENOL) 325 MG tablet Take 650 mg by mouth every 6 (six) hours as needed.      . ALPRAZolam (XANAX) 0.5 MG tablet Take 0.5 mg by mouth 2 (two) times daily as needed.      Marland Kitchen. aluminum & magnesium hydroxide-simethicone (MYLANTA)  500-450-40 MG/5ML suspension Take 30 mLs by mouth every 4 (four) hours as needed.      Marland Kitchen. aspirin 81 MG tablet Take 81 mg by mouth daily.      . bisacodyl (DULCOLAX) 10 MG suppository Place 10 mg rectally as needed.      Marland Kitchen. buPROPion (WELLBUTRIN SR) 200 MG 12 hr tablet Take 200 mg by mouth 2 (two) times daily.      . calcium-vitamin D (OSCAL WITH D) 500-200 MG-UNIT per tablet Take 1 tablet by mouth daily.      Marland Kitchen. docusate sodium (COLACE) 100 MG capsule Take 100 mg by mouth 2 (two) times daily.      Marland Kitchen. doxepin (SINEQUAN) 75 MG capsule Take 75 mg by mouth at bedtime.    Marland Kitchen. esomeprazole (NEXIUM) 40 MG capsule Take 40 mg by mouth 2 (two) times daily.      Marland Kitchen. estradiol (ESTRACE) 0.1 MG/GM vaginal cream Use vaginally three times weekly @@ qhs     . gabapentin (NEURONTIN) 100 MG capsule Take 100 mg by mouth 2 (two) times daily. Take 2 tablet (200mg ) twice daily    . glycerin adult 2 g suppository Place 1 suppository rectally as needed for constipation. 12 suppository 0  . guaifenesin (TUSSIN) 100 MG/5ML syrup 100 mg. 5 mls po q 4 hours prn cough     . HYDROcodone-acetaminophen (VICODIN) 5-500 MG per tablet Take 1 tablet by mouth every 6 (six) hours as needed.      Marland Kitchen. lisinopril (PRINIVIL,ZESTRIL) 5 MG tablet Take 5 mg by mouth daily.    Marland Kitchen. loratadine (CLARITIN) 10 MG tablet Take 10 mg by mouth daily.      . metFORMIN (GLUCOPHAGE) 500 MG tablet Take 250 mg by mouth daily with breakfast.    . nitroGLYCERIN (NITROSTAT) 0.4 MG SL tablet Place 0.4 mg under the tongue every 5 (five) minutes as needed.      Marland Kitchen. oxybutynin (DITROPAN-XL) 10 MG 24 hr tablet Take 10 mg by mouth daily.      . phenylephrine-shark liver oil-mineral oil-petrolatum (PREPARATION H) 0.25-3-14-71.9 % rectal ointment Place rectally 2 (two) times daily as needed.      . polyethylene glycol (MIRALAX / GLYCOLAX) packet Take 51 g by mouth daily. Take 3 capful in 8 oz of liquid daily until you have a large bowel movement.  Reduce back to 1 capful a day  once you have a normal stool. (hold for diarrhea) 14 each 1  . potassium chloride (KLOR-CON) 10 MEQ CR tablet Take 10 mEq by mouth daily.      . simethicone (GAS-X) 80 MG chewable tablet Chew 80 mg by mouth every 4 (four) hours.      . simvastatin (ZOCOR) 10 MG tablet Take 10 mg by mouth daily.    . sitaGLIPtin (JANUVIA) 100 MG tablet Take 100 mg by mouth daily.    . furosemide (LASIX) 20 MG tablet  Take 1 tab po every other day.     . meloxicam (MOBIC) 7.5 MG tablet Take 7.5 mg by mouth daily as needed for pain.    . nitrofurantoin, macrocrystal-monohydrate, (MACROBID) 100 MG capsule Take 100 mg by mouth daily.     No facility-administered medications prior to visit.      Allergies:   Codeine sulfate and Prevacid [lansoprazole]   Social History   Social History  . Marital status: Widowed    Spouse name: N/A  . Number of children: N/A  . Years of education: N/A   Social History Main Topics  . Smoking status: Never Smoker  . Smokeless tobacco: Never Used  . Alcohol use No  . Drug use: Unknown  . Sexual activity: Not on file   Other Topics Concern  . Not on file   Social History Narrative  . No narrative on file     Family History:  The patient's family history includes Cancer in her father and sister; Diabetes in her mother; Heart attack in her mother; Hypertension in her mother.   ROS:   Please see the history of present illness.    ROS All other systems reviewed and are negative.   PHYSICAL EXAM:   VS:  BP 138/78 (BP Location: Right Arm, Patient Position: Sitting, Cuff Size: Normal)   Pulse 60   Ht 5\' 2"  (1.575 m)   Wt 72.6 kg (160 lb)   BMI 29.26 kg/m    GEN: Well nourished, well developed, in no acute distress  HEENT: normal  Neck: no JVD, carotid bruits, or masses Cardiac: RRR; no murmurs, rubs, or gallops,no edema , healthy PM site Respiratory:  clear to auscultation bilaterally, normal work of breathing GI: soft, nontender, nondistended, + BS MS: no  deformity or atrophy  Skin: warm and dry, no rash Neuro:  Alert and Oriented x 3, Strength and sensation are intact Psych: euthymic mood, full affect  Wt Readings from Last 3 Encounters:  02/17/16 72.6 kg (160 lb)  10/05/15 68 kg (150 lb)  08/04/15 77.1 kg (170 lb)      Studies/Labs Reviewed:   EKG:  EKG is ordered today.  The ekg ordered today demonstrates A Paced, V paced rhythm. QTc 448 ms.  Recent Labs: 12/10/2015 creatinine 0.7, glucose 137, normal LFTs, hemoglobin A1c 6%  Lipid Panel 12/10/2015 total cholesterol 132, triglycerides 149, HDL 43, LDL 59  ASSESSMENT:    1. Complete heart block (HCC)   2. HYPERTENSION, BENIGN ESSENTIAL   3. Pacemaker   4. Atherosclerosis of native coronary artery of native heart without angina pectoris   5. Stenosis of left carotid artery   6. Controlled type 2 diabetes mellitus without complication, without long-term current use of insulin (HCC)      PLAN:  In order of problems listed above:  1. 3rd deg AVBlock: PPM dependent. Normal device function. Device unable to perform remote downloads. Will continue every six-month monitoring, planning to increased every 3 months in the last anticipated year of generator longevity. 2. HTN: Good BP control 3. PPM: Normal device function, not amenable to remote monitoring. Office check Q 6 months. Increase to Q3 months when she is in last year of battery life 4. CAD: Angina free. On statin with an excellent lipid profile. 5. Carotid stenosis: Left carotid endarterectomy site patent by Duplex US 2013. No routine monitoring due to advanced age and memory problems. 6. DM: Markedly improved control.  Watch carefully for development of worsening edema or orthopnea now  that she is no longer taking diuretics  Medication Adjustments/Labs and Tests Ordered: Current medicines are reviewed at length with the patient today.  Concerns regarding medicines are outlined above.  Medication changes, Labs and Tests  ordered today are listed in the Patient Instructions below. Patient Instructions  Dr Royann Shivers recommends that you schedule a follow-up appointment in 6 months with a pacemaker check. You will receive a reminder letter in the mail two months in advance. If you don't receive a letter, please call our office to schedule the follow-up appointment.  If you need a refill on your cardiac medications before your next appointment, please call your pharmacy.   Signed, Thurmon Fair, MD  02/17/2016 1:41 PM    [facility] [address] [place], [place], [place]  [address] Phone: [phone]; Fax: [phone]

## 2016-08-25 ENCOUNTER — Encounter: Payer: Medicare Other | Admitting: Cardiovascular Disease

## 2016-09-07 ENCOUNTER — Ambulatory Visit (INDEPENDENT_AMBULATORY_CARE_PROVIDER_SITE_OTHER): Payer: Medicare Other | Admitting: *Deleted

## 2016-09-07 ENCOUNTER — Encounter: Payer: Self-pay | Admitting: Cardiovascular Disease

## 2016-09-07 DIAGNOSIS — I442 Atrioventricular block, complete: Secondary | ICD-10-CM | POA: Diagnosis not present

## 2016-09-07 DIAGNOSIS — Z950 Presence of cardiac pacemaker: Secondary | ICD-10-CM

## 2016-09-07 LAB — CUP PACEART INCLINIC DEVICE CHECK
Battery Impedance: 6700 Ohm
Battery Voltage: 2.71 V
Date Time Interrogation Session: 20180905173105
Implantable Lead Implant Date: 20060511
Implantable Lead Implant Date: 20060511
Implantable Lead Location: 753860
Implantable Pulse Generator Implant Date: 20060511
Lead Channel Impedance Value: 472 Ohm
Lead Channel Pacing Threshold Pulse Width: 0.5 ms
Lead Channel Sensing Intrinsic Amplitude: 1.5 mV
Lead Channel Setting Pacing Amplitude: 2.5 V
Lead Channel Setting Pacing Pulse Width: 0.5 ms
Lead Channel Setting Sensing Sensitivity: 2 mV
MDC IDC LEAD LOCATION: 753859
MDC IDC MSMT LEADCHNL RA PACING THRESHOLD AMPLITUDE: 1.25 V
MDC IDC MSMT LEADCHNL RA PACING THRESHOLD PULSEWIDTH: 1 ms
MDC IDC MSMT LEADCHNL RV IMPEDANCE VALUE: 618 Ohm
MDC IDC MSMT LEADCHNL RV PACING THRESHOLD AMPLITUDE: 0.5 V
MDC IDC MSMT LEADCHNL RV SENSING INTR AMPL: 6.2 mV
MDC IDC PG SERIAL: 1369928
MDC IDC STAT BRADY RA PERCENT PACED: 19 %
MDC IDC STAT BRADY RV PERCENT PACED: 99 % — AB
Pulse Gen Model: 5356

## 2016-09-07 NOTE — Progress Notes (Signed)
Pacemaker check in clinic. Normal device function. Thresholds, sensing, impedances consistent with previous measurements. Device programmed to maximize longevity. 1 mode switch (<1%)--no EGM, duration 38sec. No episode triggers enabled. Device programmed at appropriate safety margins; RA output increased to 2.5V. Histogram distribution appropriate for patient activity level. Device programmed to optimize intrinsic conduction. Estimated longevity 0.75-1.5 years. Patient education completed. ROV with [facility] on 11/16/16.

## 2016-11-16 ENCOUNTER — Encounter: Payer: Self-pay | Admitting: Cardiovascular Disease

## 2016-11-16 ENCOUNTER — Ambulatory Visit (INDEPENDENT_AMBULATORY_CARE_PROVIDER_SITE_OTHER): Payer: Medicare Other | Admitting: Cardiovascular Disease

## 2016-11-16 VITALS — BP 100/56 | HR 90 | Ht 62.0 in | Wt 138.0 lb

## 2016-11-16 DIAGNOSIS — E119 Type 2 diabetes mellitus without complications: Secondary | ICD-10-CM

## 2016-11-16 DIAGNOSIS — I2510 Atherosclerotic heart disease of native coronary artery without angina pectoris: Secondary | ICD-10-CM

## 2016-11-16 DIAGNOSIS — I6522 Occlusion and stenosis of left carotid artery: Secondary | ICD-10-CM | POA: Diagnosis not present

## 2016-11-16 DIAGNOSIS — Z515 Encounter for palliative care: Secondary | ICD-10-CM

## 2016-11-16 DIAGNOSIS — I442 Atrioventricular block, complete: Secondary | ICD-10-CM

## 2016-11-16 DIAGNOSIS — Z950 Presence of cardiac pacemaker: Secondary | ICD-10-CM

## 2016-11-16 DIAGNOSIS — I5032 Chronic diastolic (congestive) heart failure: Secondary | ICD-10-CM | POA: Diagnosis not present

## 2016-11-16 DIAGNOSIS — I10 Essential (primary) hypertension: Secondary | ICD-10-CM

## 2016-11-16 NOTE — Patient Instructions (Signed)
Dr Royann Shiversroitoru has recommended making the following medication changes: 1. STOP Amlodipine  Your physician recommends that you [facility]  Dr Royann Shiversroitoru recommends that you schedule a follow-up appointment in 6 months. You will receive a reminder letter in the mail two months in advance. If you don't receive a letter, please call our office to schedule the follow-up appointment.  If you need a refill on your cardiac medications before your next appointment, please call your pharmacy.

## 2016-11-16 NOTE — Progress Notes (Signed)
Patient ID: [name], female   DOB: [birth year], [age] y.o.   MRN: [id number]    Cardiology Office Note    Date:  11/16/2016   ID:  [name], DOB [birth year], MRN [id number]  PCP:  Keane PoliceSlade-Hartman, Venezela, MD  Cardiologist:   Thurmon FairMihai Tirza Senteno, MD   Chief Complaint  Patient presents with  . Follow-up    History of Present Illness:  [name] is a 83 y.o. female  who presents for high-grade second-degree AV block with intermittent complete heart block and pacemaker check. (pacemaker dependent).   She has dementia and lives in a nursing home [facility]([place]). Accompanied by her daughter today, as always. She is very inactive due to orthopedic problems and spends most of her time sitting in a chair. She is able to transfer from a wheelchair. She is very interactive today holding a fairly spirited conversation, although repeating herself a lot.  She was hospitalized in [facility] in August with a urinary tract infection.  A couple of weeks ago she was evaluated by gastroenterologist for abdominal pain and diarrhea.  The impression was that she had chronic constipation alternating with overflow diarrhea and worsening gastroesophageal reflux disease.  The patient specifically denies any chest pain at rest exertion, dyspnea at rest or with exertion, orthopnea, paroxysmal nocturnal dyspnea, syncope, palpitations, focal neurological deficits, intermittent claudication, lower extremity edema, unexplained weight gain, cough, hemoptysis or wheezing.  The review of systems is limited by cognitive deficit.  She has lost 22 pounds since February.  Indication she has widespread coronary atherosclerosis, but without significant stenoses in large vessels. She has previous undergone a left carotid endarterectomy and has hypertension and type 2 diabetes mellitus. A recent cholesterol profile was excellent and the hemoglobin A1c was markedly improved at 6 %  Interrogation of her pacemaker  shows normal device function with 99% ventricular pacing and 12% atrial pacing. There has been a chronic increase in atrial lead threshold , but this is unlikely to be a problem since she rarely requires atrial pacing .  Other lead parameters remain excellent.  Estimated battery longevity is0.5-1.25 Years. Underlying rhythm today is idioventricular escape at 44 bpm...   Past Medical History:  Diagnosis Date  . CAD (coronary artery disease)   . CHB (complete heart block) (HCC)    Permanent pacemaker- St.Jude  . CHF (congestive heart failure) (HCC)   . Coronary artery stenosis   . DDD (degenerative disc disease), lumbar   . Diabetes mellitus 2004   type 2  . Hyperlipidemia   . Hypertension   . MRSA infection 2007   X 2  . Nephrolithiasis   . OAB (overactive bladder)   . Osteoarthritis     Past Surgical History:  Procedure Laterality Date  . ABDOMINAL HYSTERECTOMY    . ANKLE SURGERY    . APPENDECTOMY    . BACK SURGERY     X 2  . CARDIAC CATHETERIZATION  09/01/2004   diffuse small vessel disease w/large vessel 70% mid LAD stenosis  . cataract surgery    . CEA  4-07  . CHOLECYSTECTOMY    . kidney stone removal    . Left hip replacement  6-07  . LUMBAR FUSION    . NM MYOCAR PERF WALL MOTION  08/18/2008   normal  . PACEMAKER INSERTION  05/13/2004   St.Jude  . SKIN CANCER EXCISION  03/2014   right chest area  . US ECHOCARDIOGRAPHY  08/06/2008   Grade I diastolic dysfunction,trace TR,ca+  MV    Outpatient Medications Prior to Visit  Medication Sig Dispense Refill  . acetaminophen (TYLENOL) 325 MG tablet Take 650 mg by mouth every 6 (six) hours as needed.      . ALPRAZolam (XANAX) 0.25 MG tablet Take 0.25 mg by mouth daily as needed for anxiety.    Marland Kitchen aluminum & magnesium hydroxide-simethicone (MYLANTA) 500-450-40 MG/5ML suspension Take 30 mLs by mouth every 4 (four) hours as needed.      Marland Kitchen amLODipine (NORVASC) 2.5 MG tablet Take 2.5 mg by mouth daily.    Marland Kitchen aspirin 81 MG tablet  Take 81 mg by mouth daily.      Marland Kitchen buPROPion (WELLBUTRIN SR) 200 MG 12 hr tablet Take 200 mg by mouth 2 (two) times daily.      . Calcium Carb-Cholecalciferol (CALCIUM-VITAMIN D3) 600-400 MG-UNIT TABS Take 1 tablet by mouth 2 (two) times daily.    Marland Kitchen doxepin (SINEQUAN) 25 MG capsule Take 25 mg by mouth at bedtime.    Marland Kitchen esomeprazole (NEXIUM) 40 MG capsule Take 40 mg by mouth 2 (two) times daily.      Marland Kitchen estradiol (ESTRACE) 0.1 MG/GM vaginal cream Place 1 Applicatorful vaginally 2 (two) times a week.     . gabapentin (NEURONTIN) 100 MG capsule Take 100 mg by mouth at bedtime.     Marland Kitchen glycerin adult 2 g suppository Place 1 suppository rectally as needed for constipation. 12 suppository 0  . HYDROcodone-acetaminophen (VICODIN) 5-500 MG per tablet Take 1 tablet by mouth every 6 (six) hours as needed.      Marland Kitchen ibuprofen (ADVIL,MOTRIN) 600 MG tablet Take 600 mg by mouth every 8 (eight) hours as needed.     Marland Kitchen lisinopril (PRINIVIL,ZESTRIL) 10 MG tablet Take 10 mg by mouth daily.    Marland Kitchen loratadine (CLARITIN) 10 MG tablet Take 10 mg by mouth daily.      . metFORMIN (GLUCOPHAGE) 500 MG tablet Take 500 mg by mouth 2 (two) times daily with a meal.     . nitroGLYCERIN (NITROSTAT) 0.4 MG SL tablet Place 0.4 mg under the tongue every 5 (five) minutes as needed.      . ondansetron (ZOFRAN) 4 MG tablet Take 4 mg by mouth every 8 (eight) hours as needed for nausea or vomiting.    . phenylephrine-shark liver oil-mineral oil-petrolatum (PREPARATION H) 0.25-3-14-71.9 % rectal ointment Place rectally 2 (two) times daily as needed.      . polyethylene glycol (MIRALAX / GLYCOLAX) packet Take 51 g by mouth daily. Take 3 capful in 8 oz of liquid daily until you have a large bowel movement.  Reduce back to 1 capful a day once you have a normal stool. (hold for diarrhea) 14 each 1  . potassium chloride SA (K-DUR,KLOR-CON) 20 MEQ tablet Take 20 mEq by mouth daily.    . simethicone (GAS-X) 80 MG chewable tablet Chew 80 mg by mouth every 4  (four) hours as needed.     . simvastatin (ZOCOR) 10 MG tablet Take 10 mg by mouth at bedtime.     . sitaGLIPtin (JANUVIA) 100 MG tablet Take 100 mg by mouth daily.    Marland Kitchen guaifenesin (TUSSIN) 100 MG/5ML syrup 100 mg. 5 mls po q 4 hours prn cough     . docusate sodium (COLACE) 100 MG capsule Take 100 mg by mouth 2 (two) times daily.       No facility-administered medications prior to visit.      Allergies:   Codeine sulfate and Prevacid [lansoprazole]  Social History   Socioeconomic History  . Marital status: Widowed    Spouse name: None  . Number of children: None  . Years of education: None  . Highest education level: None  Social Needs  . Financial resource strain: None  . Food insecurity - worry: None  . Food insecurity - inability: None  . Transportation needs - medical: None  . Transportation needs - non-medical: None  Occupational History  . None  Tobacco Use  . Smoking status: Never Smoker  . Smokeless tobacco: Never Used  Substance and Sexual Activity  . Alcohol use: No  . Drug use: None  . Sexual activity: None  Other Topics Concern  . None  Social History Narrative  . None     Family History:  The patient's family history includes Cancer in her father and sister; Diabetes in her mother; Heart attack in her mother; Hypertension in her mother.   ROS:   Please see the history of present illness.    ROS All other systems reviewed and are negative.   PHYSICAL EXAM:   VS:  BP (!) 100/56   Pulse 90   Ht 5\' 2"  (1.575 m)   Wt 138 lb (62.6 kg)   BMI 25.24 kg/m     General: Alert, oriented x3, no distress, lean, not very communicative today Head: no evidence of trauma, PERRL, EOMI, no exophtalmos or lid lag, no myxedema, no xanthelasma; normal ears, nose and oropharynx Neck: normal jugular venous pulsations and no hepatojugular reflux; brisk carotid pulses without delay and no carotid bruits Chest: clear to auscultation, no signs of consolidation by  percussion or palpation, normal fremitus, symmetrical and full respiratory excursions Cardiovascular: normal position and quality of the apical impulse, regular rhythm, normal first and second heart sounds, no murmurs, rubs or gallops.  Healthy left subclavian pacemaker site Abdomen: no tenderness or distention, no masses by palpation, no abnormal pulsatility or arterial bruits, normal bowel sounds, no hepatosplenomegaly Extremities: no clubbing, cyanosis or edema; 2+ radial, ulnar and brachial pulses bilaterally; 2+ right femoral, posterior tibial and dorsalis pedis pulses; 2+ left femoral, posterior tibial and dorsalis pedis pulses; no subclavian or femoral bruits Neurological: grossly nonfocal Psych: Normal mood and affect\  Wt Readings from Last 3 Encounters:  11/16/16 138 lb (62.6 kg)  02/17/16 160 lb (72.6 kg)  10/05/15 150 lb (68 kg)      Studies/Labs Reviewed:   EKG:  EKG is not ordered today.    Recent Labs: August 2018 creatinine 0.66, potassium 3.8, glucose 140, hemoglobin 12.8  Lipid Panel 12/10/2015 total cholesterol 132, triglycerides 149, HDL 43, LDL 59  ASSESSMENT:    1. CHB (complete heart block) (HCC)   2. HYPERTENSION, BENIGN ESSENTIAL   3. Chronic diastolic heart failure (HCC)   4. Pacemaker - St. Jude, non RF dual chamber, 2006   5. Atherosclerosis of native coronary artery of native heart without angina pectoris   6. Stenosis of left carotid artery   7. Controlled type 2 diabetes mellitus without complication, without long-term current use of insulin (HCC)   8. Quality of life palliative care encounter      PLAN:  In order of problems listed above:  1. 3rd deg AVBlock: PPM dependent, but usually has a reliable idioventricular escape rhythm, today at 44 bpm. Normal device function, other than increasing atrial pacing threshold, which thankfully is not clinically important since she rarely requires atrial pacing.. Device unable to perform remote downloads.  Will increase pacemaker monitoring to  every 3 months as we approach battery end of service. 2. HTN: Good BP control, possibly excessive , following weight loss.  Recommended we discontinue her amlodipine. 3. CHF: Even after discontinuation of all her diuretics, no heart failure signs or symptoms.  She 4. PPM: Normal device function, not amenable to remote monitoring. Office check Q3 months since she is in last year of battery life 5. CAD: Angina free. On statin with an excellent lipid profile in the last 12 months. 6. Carotid stenosis: Left carotid endarterectomy site patent by Duplex US 2013. No routine monitoring due to advanced age and memory problems. 7. DM: No recent hemoglobin A1c but control is clearly improved with weight loss.  She is now only on metformin and Januvia. Plan for labs in December. 8. Palliative care discussion: Clearly Mrs. Macke's cognitive decline continues and her quality of life is deteriorating.  Her food intake is lessening and she is losing weight.  We discussed end-of-life decision-making with her daughter.  She seems to be hoping that her mother will not need a pacemaker generator change out and that she will die of natural causes before that.  This may or may not be her course.  It is not unreasonable to avoid putting her through any surgical procedures, including generator change out, if her quality of life deteriorates further.  I asked her daughter to discuss these considerations with all of her siblings now, before we enter a crisis and have to make an urgent decision.    Medication Adjustments/Labs and Tests Ordered: Current medicines are reviewed at length with the patient today.  Concerns regarding medicines are outlined above.  Medication changes, Labs and Tests ordered today are listed in the Patient Instructions below. Patient Instructions  Dr Royann Shiversroitoru has recommended making the following medication changes: 1. STOP Amlodipine  Your physician recommends  that you schedule a [facility].  Dr Royann Shiversroitoru recommends that you schedule a follow-up appointment in 6 months. You will receive a reminder letter in the mail two months in advance. If you don't receive a letter, please call our office to schedule the follow-up appointment.  If you need a refill on your cardiac medications before your next appointment, please call your pharmacy.   Signed, Thurmon FairMihai Katiya Fike, MD  11/16/2016 10:34 AM    [facility] [address], [place], [place]  [address] Phone: [phone]; Fax: [phone]

## 2016-11-17 ENCOUNTER — Other Ambulatory Visit: Payer: Self-pay | Admitting: Cardiovascular Disease

## 2016-12-28 LAB — CUP PACEART INCLINIC DEVICE CHECK
Battery Impedance: 8000 Ohm
Battery Voltage: 2.69 V
Date Time Interrogation Session: 20181114154053
Implantable Lead Implant Date: 20060511
Implantable Lead Location: 753859
Lead Channel Impedance Value: 298 Ohm
Lead Channel Impedance Value: 504 Ohm
Lead Channel Pacing Threshold Pulse Width: 0.5 ms
Lead Channel Pacing Threshold Pulse Width: 1 ms
Lead Channel Sensing Intrinsic Amplitude: 2 mV
Lead Channel Sensing Intrinsic Amplitude: 4.3 mV
Lead Channel Setting Sensing Sensitivity: 2 mV
MDC IDC LEAD IMPLANT DT: 20060511
MDC IDC LEAD LOCATION: 753860
MDC IDC MSMT LEADCHNL RA PACING THRESHOLD AMPLITUDE: 1.5 V
MDC IDC MSMT LEADCHNL RV PACING THRESHOLD AMPLITUDE: 0.625 V
MDC IDC PG IMPLANT DT: 20060511
MDC IDC SET LEADCHNL RA PACING AMPLITUDE: 2.5 V
MDC IDC SET LEADCHNL RV PACING PULSEWIDTH: 0.5 ms
Pulse Gen Serial Number: 1369928

## 2017-02-14 ENCOUNTER — Ambulatory Visit (INDEPENDENT_AMBULATORY_CARE_PROVIDER_SITE_OTHER): Payer: Medicare Other | Admitting: *Deleted

## 2017-02-14 DIAGNOSIS — I442 Atrioventricular block, complete: Secondary | ICD-10-CM | POA: Diagnosis not present

## 2017-02-14 LAB — CUP PACEART INCLINIC DEVICE CHECK
Battery Impedance: 11800 Ohm
Battery Voltage: 2.66 V
Date Time Interrogation Session: 20190212160219
Implantable Lead Implant Date: 20060511
Implantable Lead Location: 753859
Lead Channel Impedance Value: 278 Ohm
Lead Channel Impedance Value: 464 Ohm
Lead Channel Pacing Threshold Amplitude: 0.75 V
Lead Channel Sensing Intrinsic Amplitude: 5.5 mV
Lead Channel Setting Pacing Pulse Width: 0.5 ms
MDC IDC LEAD IMPLANT DT: 20060511
MDC IDC LEAD LOCATION: 753860
MDC IDC MSMT LEADCHNL RA PACING THRESHOLD PULSEWIDTH: 1 ms
MDC IDC MSMT LEADCHNL RV PACING THRESHOLD AMPLITUDE: 1 V
MDC IDC MSMT LEADCHNL RV PACING THRESHOLD PULSEWIDTH: 0.5 ms
MDC IDC PG IMPLANT DT: 20060511
MDC IDC PG SERIAL: 1369928
MDC IDC SET LEADCHNL RA PACING AMPLITUDE: 2.5 V
MDC IDC SET LEADCHNL RV SENSING SENSITIVITY: 2 mV
Pulse Gen Model: 5356

## 2017-02-14 NOTE — Progress Notes (Signed)
Pacemaker check in clinic. Normal device function. Thresholds, sensing, impedances consistent with previous measurements. Device programmed to maximize longevity. Device programmed at appropriate safety margins. Histogram distribution appropriate for patient activity level. Device programmed to optimize intrinsic conduction. Estimated longevity 0.25-0.75 years. ROV 04/03/17 for battery check. Patient education completed.

## 2017-04-03 ENCOUNTER — Ambulatory Visit (INDEPENDENT_AMBULATORY_CARE_PROVIDER_SITE_OTHER): Payer: Self-pay | Admitting: *Deleted

## 2017-04-03 DIAGNOSIS — I442 Atrioventricular block, complete: Secondary | ICD-10-CM

## 2017-04-03 LAB — CUP PACEART INCLINIC DEVICE CHECK
Implantable Lead Implant Date: 20060511
Implantable Lead Location: 753860
Implantable Pulse Generator Implant Date: 20060511
Lead Channel Impedance Value: 313 Ohm
Lead Channel Impedance Value: 541 Ohm
Lead Channel Setting Pacing Amplitude: 2.5 V
Lead Channel Setting Pacing Pulse Width: 0.5 ms
MDC IDC LEAD IMPLANT DT: 20060511
MDC IDC LEAD LOCATION: 753859
MDC IDC MSMT BATTERY IMPEDANCE: 14000 Ohm
MDC IDC MSMT BATTERY VOLTAGE: 2.65 V
MDC IDC PG SERIAL: 1369928
MDC IDC SESS DTM: 20190401143206
MDC IDC SET LEADCHNL RV SENSING SENSITIVITY: 2 mV

## 2017-04-03 NOTE — Progress Notes (Signed)
Battery check only. Voltage 2.65V (ERI 2.5V). ROV with DC 5/2

## 2017-05-04 ENCOUNTER — Ambulatory Visit (INDEPENDENT_AMBULATORY_CARE_PROVIDER_SITE_OTHER): Payer: Self-pay | Admitting: *Deleted

## 2017-05-04 DIAGNOSIS — I442 Atrioventricular block, complete: Secondary | ICD-10-CM

## 2017-05-04 LAB — CUP PACEART INCLINIC DEVICE CHECK
Date Time Interrogation Session: 20190502155204
Implantable Lead Implant Date: 20060511
Implantable Lead Location: 753860
MDC IDC LEAD IMPLANT DT: 20060511
MDC IDC LEAD LOCATION: 753859
MDC IDC PG IMPLANT DT: 20060511
Pulse Gen Model: 5356
Pulse Gen Serial Number: 1369928

## 2017-05-04 NOTE — Progress Notes (Signed)
Device check for battery, Battery Voltage 2.62V ( ERI 2.5V) Estimated Remaining Longevity 0-0.57yrs. ROV 06/15/2017 for full check.

## 2017-06-15 ENCOUNTER — Encounter (INDEPENDENT_AMBULATORY_CARE_PROVIDER_SITE_OTHER): Payer: Self-pay

## 2017-06-15 ENCOUNTER — Ambulatory Visit (INDEPENDENT_AMBULATORY_CARE_PROVIDER_SITE_OTHER): Payer: Medicare Other | Admitting: *Deleted

## 2017-06-15 DIAGNOSIS — I442 Atrioventricular block, complete: Secondary | ICD-10-CM | POA: Diagnosis not present

## 2017-06-19 LAB — CUP PACEART INCLINIC DEVICE CHECK
Battery Impedance: 20100 Ohm
Battery Voltage: 2.62 V
Date Time Interrogation Session: 20190613181704
Implantable Lead Implant Date: 20060511
Implantable Lead Location: 753860
Implantable Pulse Generator Implant Date: 20060511
Lead Channel Impedance Value: 675 Ohm
Lead Channel Pacing Threshold Amplitude: 0.75 V
Lead Channel Pacing Threshold Pulse Width: 0.5 ms
Lead Channel Sensing Intrinsic Amplitude: 1.4 mV
Lead Channel Sensing Intrinsic Amplitude: 4.7 mV
Lead Channel Setting Pacing Amplitude: 2.5 V
Lead Channel Setting Pacing Pulse Width: 0.5 ms
MDC IDC LEAD IMPLANT DT: 20060511
MDC IDC LEAD LOCATION: 753859
MDC IDC MSMT LEADCHNL RA IMPEDANCE VALUE: 295 Ohm
MDC IDC MSMT LEADCHNL RA PACING THRESHOLD PULSEWIDTH: 1 ms
MDC IDC MSMT LEADCHNL RV PACING THRESHOLD AMPLITUDE: 1 V
MDC IDC PG SERIAL: 1369928
MDC IDC SET LEADCHNL RV PACING AMPLITUDE: 2.5 V
MDC IDC SET LEADCHNL RV SENSING SENSITIVITY: 2 mV
Pulse Gen Model: 5356

## 2017-06-19 NOTE — Progress Notes (Signed)
Pacemaker check in clinic. Normal device function. Thresholds, sensing, impedances consistent with previous measurements. Device programmed to maximize longevity. No mode switch episodes noted. Device programmed at appropriate safety margins. Histogram distribution appropriate for patient activity level. Device programmed to optimize intrinsic conduction. RV pulse configuration reprogrammed to bipolar, RV output fixed at 2.5V@ 0.495ms. ERI reached. [facility] @ 0920. Patient education completed.

## 2017-06-27 ENCOUNTER — Encounter: Payer: Self-pay | Admitting: Cardiovascular Disease

## 2017-06-27 ENCOUNTER — Ambulatory Visit (INDEPENDENT_AMBULATORY_CARE_PROVIDER_SITE_OTHER): Payer: Medicare Other | Admitting: Cardiovascular Disease

## 2017-06-27 VITALS — BP 130/62 | HR 103 | Ht 63.0 in | Wt 133.0 lb

## 2017-06-27 DIAGNOSIS — I5032 Chronic diastolic (congestive) heart failure: Secondary | ICD-10-CM | POA: Diagnosis not present

## 2017-06-27 DIAGNOSIS — I10 Essential (primary) hypertension: Secondary | ICD-10-CM

## 2017-06-27 DIAGNOSIS — D689 Coagulation defect, unspecified: Secondary | ICD-10-CM | POA: Diagnosis not present

## 2017-06-27 DIAGNOSIS — Z45010 Encounter for checking and testing of cardiac pacemaker pulse generator [battery]: Secondary | ICD-10-CM | POA: Diagnosis not present

## 2017-06-27 DIAGNOSIS — I2510 Atherosclerotic heart disease of native coronary artery without angina pectoris: Secondary | ICD-10-CM | POA: Diagnosis not present

## 2017-06-27 DIAGNOSIS — I442 Atrioventricular block, complete: Secondary | ICD-10-CM

## 2017-06-27 DIAGNOSIS — E1159 Type 2 diabetes mellitus with other circulatory complications: Secondary | ICD-10-CM

## 2017-06-27 DIAGNOSIS — I6522 Occlusion and stenosis of left carotid artery: Secondary | ICD-10-CM | POA: Diagnosis not present

## 2017-06-27 NOTE — H&P (View-Only) (Signed)
Patient ID: [name], female   DOB: 12-02-1923, 84 y.o.   MRN: [id number]    Cardiology Office Note    Date:  06/27/2017   ID:  [name], DOB [birth year], MRN [id number]  PCP:  Keane Police, MD  Cardiologist:   Thurmon Fair, MD   Chief Complaint  Patient presents with  . Pacemaker Check    ERI    History of Present Illness:  [name] is a 84 y.o. female  who presents for high-grade second-degree AV block with intermittent complete heart block and pacemaker check. (pacemaker dependent).   She has dementia and lives in a nursing home [facility]). Accompanied by her daughter today, as always.  Her pacemaker reached elective replacement indicator roughly a month ago and she is here to discuss generator change.  She has not had any other recent medical problems or hospitalizations.  Review of systems is limited by her cognitive deficits, but the patient specifically denies any chest pain at rest exertion, dyspnea at rest or with exertion, orthopnea, paroxysmal nocturnal dyspnea, syncope, palpitations, focal neurological deficits, intermittent claudication, lower extremity edema, unexplained weight gain, cough, hemoptysis or wheezing.  She continues to lose weight and has problems with vomiting, but she remains at a healthy weight.  She has previous undergone a left carotid endarterectomy and has hypertension and type 2 diabetes mellitus.  Her most recent hemoglobin A1c was 5.9% and her LDL cholesterol was 58. Labs from December 08, 2016)  Her pacemaker is noted to have a relatively high atrial pacing threshold, but this is rarely an issue since she mostly has atrial sensed, ventricular paced rhythm.  Ventricular lead parameters remain excellent.  She usually has a ventricular escape rhythm at around 44 bpm, but this was not tested today.  Presenting rhythm was alternating sinus with atrial paced beats and 100% demand ventricular pacing.  Past  Medical History:  Diagnosis Date  . CAD (coronary artery disease)   . CHB (complete heart block) (HCC)    Permanent pacemaker- [employer]ude  . CHF (congestive heart failure) (HCC)   . Coronary artery stenosis   . DDD (degenerative disc disease), lumbar   . Diabetes mellitus 2004   type 2  . Hyperlipidemia   . Hypertension   . MRSA infection 2007   X 2  . Nephrolithiasis   . OAB (overactive bladder)   . Osteoarthritis     Past Surgical History:  Procedure Laterality Date  . ABDOMINAL HYSTERECTOMY    . ANKLE SURGERY    . APPENDECTOMY    . BACK SURGERY     X 2  . CARDIAC CATHETERIZATION  09/01/2004   diffuse small vessel disease w/large vessel 70% mid LAD stenosis  . cataract surgery    . CEA  4-07  . CHOLECYSTECTOMY    . kidney stone removal    . Left hip replacement  6-07  . LUMBAR FUSION    . NM MYOCAR PERF WALL MOTION  08/18/2008   normal  . PACEMAKER INSERTION  05/13/2004   [employer]  . SKIN CANCER EXCISION  03/2014   right chest area  . US ECHOCARDIOGRAPHY  08/06/2008   Grade I diastolic dysfunction,trace TR,ca+ MV    Outpatient Medications Prior to Visit  Medication Sig Dispense Refill  . acetaminophen (TYLENOL) 325 MG tablet Take 650 mg by mouth every 4 (four) hours as needed.    . ALPRAZolam (XANAX) 0.25 MG tablet Take 0.25 mg by mouth daily as needed for anxiety.    Marland Kitchen  aluminum & magnesium hydroxide-simethicone (MYLANTA) 500-450-40 MG/5ML suspension Take 30 mLs by mouth every 4 (four) hours as needed.      . bisacodyl (DULCOLAX) 5 MG EC tablet Take 10 mg by mouth daily.    Marland Kitchen buPROPion (WELLBUTRIN SR) 200 MG 12 hr tablet Take 200 mg by mouth 2 (two) times daily.      Marland Kitchen doxepin (SINEQUAN) 25 MG capsule Take 25 mg by mouth at bedtime.    Marland Kitchen esomeprazole (NEXIUM) 40 MG capsule Take 40 mg by mouth 2 (two) times daily.      Marland Kitchen estradiol (ESTRACE) 0.1 MG/GM vaginal cream Place 1 Applicatorful vaginally 2 (two) times a week.     . gabapentin (NEURONTIN) 100 MG capsule Take 100 mg  by mouth at bedtime.     Marland Kitchen HYDROcodone-acetaminophen (NORCO/VICODIN) 5-325 MG tablet Take 1 tablet by mouth 2 (two) times daily.    Marland Kitchen ibuprofen (ADVIL,MOTRIN) 600 MG tablet Take 600 mg by mouth every 8 (eight) hours as needed.     Marland Kitchen lisinopril (PRINIVIL,ZESTRIL) 10 MG tablet Take 10 mg by mouth daily.    Marland Kitchen loratadine (CLARITIN) 10 MG tablet Take 10 mg by mouth daily.      . meloxicam (MOBIC) 7.5 MG tablet Take 7.5 mg by mouth daily.    . mirtazapine (REMERON) 7.5 MG tablet Take 7.5 mg by mouth at bedtime.    . nitrofurantoin, macrocrystal-monohydrate, (MACROBID) 100 MG capsule Take 100 mg by mouth daily.    . nitroGLYCERIN (NITROSTAT) 0.4 MG SL tablet Place 0.4 mg under the tongue every 5 (five) minutes as needed.      . ondansetron (ZOFRAN) 4 MG tablet Take 4 mg by mouth every 8 (eight) hours as needed for nausea or vomiting.    . phenazopyridine (PYRIDIUM) 100 MG tablet Take 100 mg by mouth 3 (three) times daily as needed for pain.    . phenylephrine-shark liver oil-mineral oil-petrolatum (PREPARATION H) 0.25-3-14-71.9 % rectal ointment Place rectally 2 (two) times daily as needed.      . polyethylene glycol (MIRALAX / GLYCOLAX) packet Take 51 g by mouth daily. Take 3 capful in 8 oz of liquid daily until you have a large bowel movement.  Reduce back to 1 capful a day once you have a normal stool. (hold for diarrhea) 14 each 1  . simethicone (GAS-X) 80 MG chewable tablet Chew 80 mg by mouth every 4 (four) hours as needed.     . sitaGLIPtin (JANUVIA) 100 MG tablet Take 50 mg by mouth daily.      No facility-administered medications prior to visit.      Allergies:   Codeine sulfate and Prevacid [lansoprazole]   Social History   Socioeconomic History  . Marital status: Widowed    Spouse name: Not on file  . Number of children: Not on file  . Years of education: Not on file  . Highest education level: Not on file  Occupational History  . Not on file  Social Needs  . Financial resource  strain: Not on file  . Food insecurity:    Worry: Not on file    Inability: Not on file  . Transportation needs:    Medical: Not on file    Non-medical: Not on file  Tobacco Use  . Smoking status: Never Smoker  . Smokeless tobacco: Never Used  Substance and Sexual Activity  . Alcohol use: No  . Drug use: Not on file  . Sexual activity: Not on file  Lifestyle  .  Physical activity:    Days per week: Not on file    Minutes per session: Not on file  . Stress: Not on file  Relationships  . Social connections:    Talks on phone: Not on file    Gets together: Not on file    Attends religious service: Not on file    Active member of club or organization: Not on file    Attends meetings of clubs or organizations: Not on file    Relationship status: Not on file  Other Topics Concern  . Not on file  Social History Narrative  . Not on file     Family History:  The patient's family history includes Cancer in her father and sister; Diabetes in her mother; Heart attack in her mother; Hypertension in her mother.   ROS:   Please see the history of present illness.    ROS All other systems reviewed and are negative.   PHYSICAL EXAM:   VS:  BP 130/62   Pulse (!) 103   Ht 5\' 3"  (1.6 m)   Wt 133 lb (60.3 kg)   BMI 23.56 kg/m     General: Alert, oriented x3, no distress, elderly Head: no evidence of trauma, PERRL, EOMI, no exophtalmos or lid lag, no myxedema, no xanthelasma; normal ears, nose and oropharynx.  Edentulous Neck: normal jugular venous pulsations and no hepatojugular reflux; brisk carotid pulses without delay and no carotid bruits Chest: clear to auscultation, no signs of consolidation by percussion or palpation, normal fremitus, symmetrical and full respiratory excursions.  Healthy left subclavian pacemaker site Cardiovascular: normal position and quality of the apical impulse, regular rhythm, normal first and paradoxically split second heart sounds, no murmurs, rubs or  gallops Abdomen: no tenderness or distention, no masses by palpation, no abnormal pulsatility or arterial bruits, normal bowel sounds, no hepatosplenomegaly Extremities: no clubbing, cyanosis or edema; 2+ radial, ulnar and brachial pulses bilaterally; 2+ right femoral, posterior tibial and dorsalis pedis pulses; 2+ left femoral, posterior tibial and dorsalis pedis pulses; no subclavian or femoral bruits Neurological: grossly nonfocal Psych: Normal mood and affect   Wt Readings from Last 3 Encounters:  06/27/17 133 lb (60.3 kg)  11/16/16 138 lb (62.6 kg)  02/17/16 160 lb (72.6 kg)      Studies/Labs Reviewed:   EKG:  EKG is not ordered today.    Recent Labs: August 2018 creatinine 0.66, potassium 3.8, glucose 140, hemoglobin 12.8  Lipid Panel 12/10/2015 total cholesterol 132, triglycerides 149, HDL 43, LDL 59  ASSESSMENT:    1. Pacemaker battery depletion   2. CHB (complete heart block) (HCC)   3. Essential hypertension   4. Chronic diastolic heart failure (HCC)   5. Coronary artery disease involving native coronary artery of native heart without angina pectoris   6. Asymptomatic carotid artery stenosis, left   7. Controlled type 2 diabetes mellitus with other circulatory complication, without long-term current use of insulin (HCC)   8. Blood clotting disorder (HCC)      PLAN:  In order of problems listed above:  1. PM@ERI : This procedure has been fully reviewed with the patient and written informed consent has been obtained.  I do not think the risks of placing a new atrial lead are worth it. 2. 3rd deg AVBlock: She is pacemaker dependent, so I recommended that we implant a Tyrx pouch. 3. HTN: Medications have been curtailed as she has been weight.  Blood pressure within desirable range. 4. CHF: No signs or symptoms  of heart failure despite discontinuation of diuretic therapy; it is possible her weight loss has led to improvement 5. CAD: Angina free.  Favorable lipid  profile last December, she is due for repeat. 6. Carotid stenosis: Left carotid endarterectomy site patent by Duplex US 2013. No routine monitoring due to advanced age and memory problems. 7. DM: Excellent control of glycemia on monotherapy with Januvia.  Again, the improvement is attributable to weight loss. 8. Palliative care discussion: Despite continued deterioration in functional status, the patient and her family believes that she should go ahead with pacemaker generator change out.    Medication Adjustments/Labs and Tests Ordered: Current medicines are reviewed at length with the patient today.  Concerns regarding medicines are outlined above.  Medication changes, Labs and Tests ordered today are listed in the Patient Instructions below. Patient Instructions  DEVICE IMPLANT PATIENT INSTRUCTIONS   PROCEDURE: Pacemaker Generator Changeout  DATE: Wednesday, July 17th, 2019  ARRIVAL TIME: 11:30 am   Please arrive to the Short Stay Center located at [address] [facility] - Entrance "[address] [facility].    You may eat an early, light breakfast but nothing to eat after 8:00 am.   Take your morning medications like you normally do with a sip of water.   You will need to have blood work completed prior to your procedure. Please have this done within 7 days prior to the procedure. There is a lab located in our office you can utilize if our location is convenient for you.   Plan for an overnight stay, bring your insurance cards, and a current list of your medications.   Wash your chest and neck with the surgical scrub provided or any brand of anti-bacterial soap the evening before and the morning of your procedure. Rinse well.   If you have any questions after you get home, please call the office at [phone].   Thank you,  Dr Rachelle HoraMihai Anise Harbin   *Special Note: Every effort is made to have your procedure done on time. Occasionally there are  emergencies that present themselves at the hospital that may cause delays. Please be patient if a delay does occur.   Preparing for Surgery  Before surgery, you can play an important role. Because skin is not sterile, your skin needs to be as free of germs as possible. You can reduce the number of germs on your skin by washing with  CHG (chlorhexidine gluconate) Soap before surgery. CHG is an antiseptic cleaner which kills germs and bonds with the skin to continue killing germs even after washing.  Please do not use if you have an allergy to CHG or antibacterial soaps. If your skin becomes reddened/irritated, STOP using the CHG.  DO NOT SHAVE (including legs and underarms) for at least 48 hours prior to first CHG shower. It is OK to shave your face.  Please follow these instructions carefully: 1. Shower the night before surgery and the morning of surgery with CHG Soap. 2. If you chose to wash your hair, wash your hair first as usual with your normal shampoo/conditioner. 3. After you shampoo/condition, rinse you hair and body thoroughly to remove shampoo/conditioner. 4. Use CHG as you would any other liquid soap. You can apply CHG directly to the skin and wash gently with a scrungie or a clean washcloth. 5. Apply the CHG Soap to your body ONLY FROM THE NECK DOWN. Do not use on open wounds or open sores. Avoid contact  with your eyes, ears, mouth, and genitals (private parts). Wash genitals (private part) with your normal soap. 6. Wash thoroughly, paying special attention to the area where your surgery will be performed. 7. Thoroughly rinse your body with warm water from the neck down. 8. DO NOT shower/wash with your normal soap after using and rinsing off the CHG Soap. 9. Pat yourself dry with a clean towel. 10. Wear clean pajamas to bed. 11. Place clean sheets on your bed the night of your first shower and do not sleep with pets. Day of Surgery: Shower with the CHG Soap following the  instructions listed above. DO NOT apply deodorants or lotions. Please wear clean clothes to the hospital/surgery center.    Pacemaker Battery Change A pacemaker battery usually lasts 5-15 years (6-7 years on average). A few times a year, you will be asked to visit your health care provider to have a full evaluation of your pacemaker. When the battery is low, your pacemaker battery and generator will be completely replaced. Most often, this procedure is simpler than the first surgery because the wires (leads) that connect the generator to the heart are already in place. There are many things that affect how long a pacemaker battery will last, including:  The age of the pacemaker.  The number of leads you have(1, 2, or 3).  The pacemaker workload. If the pacemaker is helping the heart more often, the battery will not last as long.  Power (voltage) settings.  Tell a health care provider about:  Any allergies you have.  All medicines you are taking, including vitamins, herbs, eye drops, creams, and over-the-counter medicines.  Any problems you or family members have had with anesthetic medicines.  Any blood disorders you have.  Any surgeries you have had, especially the surgeries you have had since your last pacemaker was placed.  Any medical conditions you have.  Whether you are pregnant or may be pregnant.  Any symptoms of heart problems, such as chest pain, trouble breathing, palpitations, light-headedness, or feelings of an abnormal or irregular heartbeat.  Smoking habits. This can affect your reaction to anesthesia. What are the risks? Generally, this is a safe procedure. However, problems may occur, including:  Bleeding.  Bruising of the skin around where the surgical cut (incision) was made.  Pulling apart of the skin at the incision site.  Infection.  Nerve damage.  Injury to other organs, such as the lungs.  Allergic reaction to anesthetics or other medicines  used during the procedure.  People with diabetes may have a temporary increase in blood sugar (glucose) after any surgical procedure.  What happens before the procedure? Staying hydrated Follow instructions from your health care provider about hydration, which may include:  Up to 2 hours before the procedure - you may continue to drink clear liquids, such as water, clear fruit juice, black coffee, and plain tea.  Eating and drinking restrictions Follow instructions from your health care provider about eating and drinking restrictions, which may include:  8 hours before the procedure - stop eating heavy meals or foods such as meat, fried foods, or fatty foods.  6 hours before the procedure - stop eating light meals or foods, such as toast or cereal.  6 hours before the procedure - stop drinking milk or drinks that contain milk.  2 hours before the procedure - stop drinking clear liquids.  General instructions  Ask your health care provider about: ? Changing or stopping your regular medicines. This is especially  important if you are taking diabetes medicines or blood thinners. ? Taking medicines such as aspirin and ibuprofen. These medicines can thin your blood. Do not take these medicines before your procedure if your health care provider instructs you not to. ? Taking a sip of water with any approved medicines on the morning of the procedure.  Plan to have someone take you home after the procedure. What happens during the procedure?  To reduce your risk of infection: ? Your health care team will wash or sanitize their hands. ? The skin around the area of the chest will be washed with soap. ? Hair may be removed from the surgical area.  An IV tube will be inserted into one your veins to give you medicine and fluids.  You will be given one or more of the following: ? A medicine to help you relax (sedative). ? A medicine to numb the area where the pacemaker is located (local  anesthetic).  You may be given antibiotic medicine to prevent infection.  Your health care provider will make an incision to reopen the pocket holding the pacemaker.  The old pacemaker will be disconnected from the leads.  The leads will be tested.  If needed, the leads will be replaced. If the leads are functioning properly, the new pacemaker will be connected to the existing leads.  A heart monitor and the pacemaker programmer will be used to make sure that the newly implanted pacemaker is working properly.  The incision site will be closed. A bandage (dressing) will be placed over the pacemaker site. The procedure may vary among health care providers and hospitals. What happens after the procedure?  Your blood pressure, heart rate, breathing rate, and blood oxygen level will be monitored until your health care team is satisfied that your pacemaker is working properly.  Your health care provider will tell you when your pacemaker will need to be tested again, or when to return to the office for removal of dressing and stitches.  Do not drive for 24 hours if you were given a sedative.  The dressing will be removed 24-48 hours after the procedure, or as told by your health care provider. Summary  A pacemaker battery usually lasts 5-15 years (6-7 years on average).  When the battery is low, your pacemaker battery and generator will be completely replaced.  Risks of this procedure include bleeding, bruising, infection, damage to other structures, pulling apart of the skin at the incision site, and allergic reactions to medicines or anesthetics.  Most often, this procedure is simpler than the first surgery because the wires (leads) that connect the generator to the heart are already in place. This information is not intended to replace advice given to you by your health care provider. Make sure you discuss any questions you have with your health care provider. Document Released:  03/30/2006 Document Revised: 11/24/2015 Document Reviewed: 11/24/2015 Elsevier Interactive Patient Education  2017 [employer].    Supplemental Discharge Instructions for  Pacemaker/Defibrillator Patients  ACTIVITY Do not raise your left/right arm above shoulder level or extend it backward beyond shoulder level for 2 weeks. Wear the arm sling as a reminder or as needed for comfort for 2 weeks. No heavy lifting or vigorous activity with your left/right arm for 6-8 weeks.    NO DRIVING is preferable for 2 weeks; If absolutely necessary, drive only short, familiar routes. DO wear your seatbelt, even if it crosses over the pacemaker site.  WOUND CARE - Keep the  wound area clean and dry.  Remove the dressing the day after you return home (usually 48 hours after the procedure). - DO NOT SUBMERGE UNDER WATER UNTIL FULLY HEALED (no tub baths, hot tubs, swimming pools, etc.).  - You  may shower or take a sponge bath after the dressing is removed. DO NOT SOAK the area and do not allow the shower to directly spray on the site. - If you have staples, these will be removed in the office in 7-14 days. - If you have tape/steri-strips on your wound, these will fall off; do not pull them off prematurely.   - No bandage is needed on the site.  DO  NOT apply any creams, oils, or ointments to the wound area. - If you notice any drainage or discharge from the wound, any swelling, excessive redness or bruising at the site, or if you develop a fever > 101? F after you are discharged home, call the office at once.  SPECIAL INSTRUCTIONS - You are still able to use cellular telephones.  Avoid carrying your cellular phone near your device. - When traveling through airports, show security personnel your identification card to avoid being screened in the metal detectors.  - Avoid arc welding equipment, MRI testing (magnetic resonance imaging), TENS units (transcutaneous nerve stimulators).  Call the office for  questions about other devices. - Avoid electrical appliances that are in poor condition or are not properly grounded. - Microwave ovens are safe to be near or to operate.  DO NOT DRIVE YOURSELF OR A FAMILY MEMBER WITH A DEFIBRILLATOR TO THE HOSPITAL-CALL 911.     Signed, Thurmon Fair, MD  06/27/2017 9:59 AM    [facility] [address] [place], [place], [place]  [address] Phone: [phone]; Fax: [phone]

## 2017-06-27 NOTE — Progress Notes (Signed)
Patient ID: [name], female   DOB: 1923/02/17, 84 y.o.   MRN: [id number]    Cardiology Office Note    Date:  06/27/2017   ID:  [name], DOB 10-12-1923, MRN [id number]  PCP:  Keane Police, MD  Cardiologist:   Thurmon Fair, MD   Chief Complaint  Patient presents with  . Pacemaker Check    ERI    History of Present Illness:  [name] is a 84 y.o. female  who presents for high-grade second-degree AV block with intermittent complete heart block and pacemaker check. (pacemaker dependent).   She has dementia and lives in a nursing home [facility]). Accompanied by her daughter today, as always.  Her pacemaker reached elective replacement indicator roughly a month ago and she is here to discuss generator change.  She has not had any other recent medical problems or hospitalizations.  Review of systems is limited by her cognitive deficits, but the patient specifically denies any chest pain at rest exertion, dyspnea at rest or with exertion, orthopnea, paroxysmal nocturnal dyspnea, syncope, palpitations, focal neurological deficits, intermittent claudication, lower extremity edema, unexplained weight gain, cough, hemoptysis or wheezing.  She continues to lose weight and has problems with vomiting, but she remains at a healthy weight.  She has previous undergone a left carotid endarterectomy and has hypertension and type 2 diabetes mellitus.  Her most recent hemoglobin A1c was 5.9% and her LDL cholesterol was 58. Labs from December 08, 2016)  Her pacemaker is noted to have a relatively high atrial pacing threshold, but this is rarely an issue since she mostly has atrial sensed, ventricular paced rhythm.  Ventricular lead parameters remain excellent.  She usually has a ventricular escape rhythm at around 44 bpm, but this was not tested today.  Presenting rhythm was alternating sinus with atrial paced beats and 100% demand ventricular pacing.  Past  Medical History:  Diagnosis Date  . CAD (coronary artery disease)   . CHB (complete heart block) (HCC)    Permanent pacemaker- [employer]ude  . CHF (congestive heart failure) (HCC)   . Coronary artery stenosis   . DDD (degenerative disc disease), lumbar   . Diabetes mellitus 2004   type 2  . Hyperlipidemia   . Hypertension   . MRSA infection 2007   X 2  . Nephrolithiasis   . OAB (overactive bladder)   . Osteoarthritis     Past Surgical History:  Procedure Laterality Date  . ABDOMINAL HYSTERECTOMY    . ANKLE SURGERY    . APPENDECTOMY    . BACK SURGERY     X 2  . CARDIAC CATHETERIZATION  09/01/2004   diffuse small vessel disease w/large vessel 70% mid LAD stenosis  . cataract surgery    . CEA  4-07  . CHOLECYSTECTOMY    . kidney stone removal    . Left hip replacement  6-07  . LUMBAR FUSION    . NM MYOCAR PERF WALL MOTION  08/18/2008   normal  . PACEMAKER INSERTION  05/13/2004   [employer]  . SKIN CANCER EXCISION  03/2014   right chest area  . US ECHOCARDIOGRAPHY  08/06/2008   Grade I diastolic dysfunction,trace TR,ca+ MV    Outpatient Medications Prior to Visit  Medication Sig Dispense Refill  . acetaminophen (TYLENOL) 325 MG tablet Take 650 mg by mouth every 4 (four) hours as needed.    . ALPRAZolam (XANAX) 0.25 MG tablet Take 0.25 mg by mouth daily as needed for anxiety.    Marland Kitchen  aluminum & magnesium hydroxide-simethicone (MYLANTA) 500-450-40 MG/5ML suspension Take 30 mLs by mouth every 4 (four) hours as needed.      . bisacodyl (DULCOLAX) 5 MG EC tablet Take 10 mg by mouth daily.    Marland Kitchen buPROPion (WELLBUTRIN SR) 200 MG 12 hr tablet Take 200 mg by mouth 2 (two) times daily.      Marland Kitchen doxepin (SINEQUAN) 25 MG capsule Take 25 mg by mouth at bedtime.    Marland Kitchen esomeprazole (NEXIUM) 40 MG capsule Take 40 mg by mouth 2 (two) times daily.      Marland Kitchen estradiol (ESTRACE) 0.1 MG/GM vaginal cream Place 1 Applicatorful vaginally 2 (two) times a week.     . gabapentin (NEURONTIN) 100 MG capsule Take 100 mg  by mouth at bedtime.     Marland Kitchen HYDROcodone-acetaminophen (NORCO/VICODIN) 5-325 MG tablet Take 1 tablet by mouth 2 (two) times daily.    Marland Kitchen ibuprofen (ADVIL,MOTRIN) 600 MG tablet Take 600 mg by mouth every 8 (eight) hours as needed.     Marland Kitchen lisinopril (PRINIVIL,ZESTRIL) 10 MG tablet Take 10 mg by mouth daily.    Marland Kitchen loratadine (CLARITIN) 10 MG tablet Take 10 mg by mouth daily.      . meloxicam (MOBIC) 7.5 MG tablet Take 7.5 mg by mouth daily.    . mirtazapine (REMERON) 7.5 MG tablet Take 7.5 mg by mouth at bedtime.    . nitrofurantoin, macrocrystal-monohydrate, (MACROBID) 100 MG capsule Take 100 mg by mouth daily.    . nitroGLYCERIN (NITROSTAT) 0.4 MG SL tablet Place 0.4 mg under the tongue every 5 (five) minutes as needed.      . ondansetron (ZOFRAN) 4 MG tablet Take 4 mg by mouth every 8 (eight) hours as needed for nausea or vomiting.    . phenazopyridine (PYRIDIUM) 100 MG tablet Take 100 mg by mouth 3 (three) times daily as needed for pain.    . phenylephrine-shark liver oil-mineral oil-petrolatum (PREPARATION H) 0.25-3-14-71.9 % rectal ointment Place rectally 2 (two) times daily as needed.      . polyethylene glycol (MIRALAX / GLYCOLAX) packet Take 51 g by mouth daily. Take 3 capful in 8 oz of liquid daily until you have a large bowel movement.  Reduce back to 1 capful a day once you have a normal stool. (hold for diarrhea) 14 each 1  . simethicone (GAS-X) 80 MG chewable tablet Chew 80 mg by mouth every 4 (four) hours as needed.     . sitaGLIPtin (JANUVIA) 100 MG tablet Take 50 mg by mouth daily.      No facility-administered medications prior to visit.      Allergies:   Codeine sulfate and Prevacid [lansoprazole]   Social History   Socioeconomic History  . Marital status: Widowed    Spouse name: Not on file  . Number of children: Not on file  . Years of education: Not on file  . Highest education level: Not on file  Occupational History  . Not on file  Social Needs  . Financial resource  strain: Not on file  . Food insecurity:    Worry: Not on file    Inability: Not on file  . Transportation needs:    Medical: Not on file    Non-medical: Not on file  Tobacco Use  . Smoking status: Never Smoker  . Smokeless tobacco: Never Used  Substance and Sexual Activity  . Alcohol use: No  . Drug use: Not on file  . Sexual activity: Not on file  Lifestyle  .  Physical activity:    Days per week: Not on file    Minutes per session: Not on file  . Stress: Not on file  Relationships  . Social connections:    Talks on phone: Not on file    Gets together: Not on file    Attends religious service: Not on file    Active member of club or organization: Not on file    Attends meetings of clubs or organizations: Not on file    Relationship status: Not on file  Other Topics Concern  . Not on file  Social History Narrative  . Not on file     Family History:  The patient's family history includes Cancer in her father and sister; Diabetes in her mother; Heart attack in her mother; Hypertension in her mother.   ROS:   Please see the history of present illness.    ROS All other systems reviewed and are negative.   PHYSICAL EXAM:   VS:  BP 130/62   Pulse (!) 103   Ht 5\' 3"  (1.6 m)   Wt 133 lb (60.3 kg)   BMI 23.56 kg/m     General: Alert, oriented x3, no distress, elderly Head: no evidence of trauma, PERRL, EOMI, no exophtalmos or lid lag, no myxedema, no xanthelasma; normal ears, nose and oropharynx.  Edentulous Neck: normal jugular venous pulsations and no hepatojugular reflux; brisk carotid pulses without delay and no carotid bruits Chest: clear to auscultation, no signs of consolidation by percussion or palpation, normal fremitus, symmetrical and full respiratory excursions.  Healthy left subclavian pacemaker site Cardiovascular: normal position and quality of the apical impulse, regular rhythm, normal first and paradoxically split second heart sounds, no murmurs, rubs or  gallops Abdomen: no tenderness or distention, no masses by palpation, no abnormal pulsatility or arterial bruits, normal bowel sounds, no hepatosplenomegaly Extremities: no clubbing, cyanosis or edema; 2+ radial, ulnar and brachial pulses bilaterally; 2+ right femoral, posterior tibial and dorsalis pedis pulses; 2+ left femoral, posterior tibial and dorsalis pedis pulses; no subclavian or femoral bruits Neurological: grossly nonfocal Psych: Normal mood and affect   Wt Readings from Last 3 Encounters:  06/27/17 133 lb (60.3 kg)  11/16/16 138 lb (62.6 kg)  02/17/16 160 lb (72.6 kg)      Studies/Labs Reviewed:   EKG:  EKG is not ordered today.    Recent Labs: August 2018 creatinine 0.66, potassium 3.8, glucose 140, hemoglobin 12.8  Lipid Panel 12/10/2015 total cholesterol 132, triglycerides 149, HDL 43, LDL 59  ASSESSMENT:    1. Pacemaker battery depletion   2. CHB (complete heart block) (HCC)   3. Essential hypertension   4. Chronic diastolic heart failure (HCC)   5. Coronary artery disease involving native coronary artery of native heart without angina pectoris   6. Asymptomatic carotid artery stenosis, left   7. Controlled type 2 diabetes mellitus with other circulatory complication, without long-term current use of insulin (HCC)   8. Blood clotting disorder (HCC)      PLAN:  In order of problems listed above:  1. PM@ERI : This procedure has been fully reviewed with the patient and written informed consent has been obtained.  I do not think the risks of placing a new atrial lead are worth it. 2. 3rd deg AVBlock: She is pacemaker dependent, so I recommended that we implant a Tyrx pouch. 3. HTN: Medications have been curtailed as she has been weight.  Blood pressure within desirable range. 4. CHF: No signs or symptoms  of heart failure despite discontinuation of diuretic therapy; it is possible her weight loss has led to improvement 5. CAD: Angina free.  Favorable lipid  profile last December, she is due for repeat. 6. Carotid stenosis: Left carotid endarterectomy site patent by Duplex US 2013. No routine monitoring due to advanced age and memory problems. 7. DM: Excellent control of glycemia on monotherapy with Januvia.  Again, the improvement is attributable to weight loss. 8. Palliative care discussion: Despite continued deterioration in functional status, the patient and her family believes that she should go ahead with pacemaker generator change out.    Medication Adjustments/Labs and Tests Ordered: Current medicines are reviewed at length with the patient today.  Concerns regarding medicines are outlined above.  Medication changes, Labs and Tests ordered today are listed in the Patient Instructions below. Patient Instructions  DEVICE IMPLANT PATIENT INSTRUCTIONS   PROCEDURE: Pacemaker Generator Changeout  DATE: Wednesday, July 17th, 2019  ARRIVAL TIME: 11:30 am   Please arrive to the Short Stay Center located at [address] [facility] [address] - Entrance "[address]." [facility].    You may eat an early, light breakfast but nothing to eat after 8:00 am.   Take your morning medications like you normally do with a sip of water.   You will need to have blood work completed prior to your procedure. Please have this done within 7 days prior to the procedure. There is a lab located in our office you can utilize if our location is convenient for you.   Plan for an overnight stay, bring your insurance cards, and a current list of your medications.   Wash your chest and neck with the surgical scrub provided or any brand of anti-bacterial soap the evening before and the morning of your procedure. Rinse well.   If you have any questions after you get home, please call the office at [phone].   Thank you,  Dr Rachelle HoraMihai Alix Stowers   *Special Note: Every effort is made to have your procedure done on time. Occasionally there are  emergencies that present themselves at the hospital that may cause delays. Please be patient if a delay does occur.   Preparing for Surgery  Before surgery, you can play an important role. Because skin is not sterile, your skin needs to be as free of germs as possible. You can reduce the number of germs on your skin by washing with  CHG (chlorhexidine gluconate) Soap before surgery. CHG is an antiseptic cleaner which kills germs and bonds with the skin to continue killing germs even after washing.  Please do not use if you have an allergy to CHG or antibacterial soaps. If your skin becomes reddened/irritated, STOP using the CHG.  DO NOT SHAVE (including legs and underarms) for at least 48 hours prior to first CHG shower. It is OK to shave your face.  Please follow these instructions carefully: 1. Shower the night before surgery and the morning of surgery with CHG Soap. 2. If you chose to wash your hair, wash your hair first as usual with your normal shampoo/conditioner. 3. After you shampoo/condition, rinse you hair and body thoroughly to remove shampoo/conditioner. 4. Use CHG as you would any other liquid soap. You can apply CHG directly to the skin and wash gently with a scrungie or a clean washcloth. 5. Apply the CHG Soap to your body ONLY FROM THE NECK DOWN. Do not use on open wounds or open sores. Avoid contact  with your eyes, ears, mouth, and genitals (private parts). Wash genitals (private part) with your normal soap. 6. Wash thoroughly, paying special attention to the area where your surgery will be performed. 7. Thoroughly rinse your body with warm water from the neck down. 8. DO NOT shower/wash with your normal soap after using and rinsing off the CHG Soap. 9. Pat yourself dry with a clean towel. 10. Wear clean pajamas to bed. 11. Place clean sheets on your bed the night of your first shower and do not sleep with pets. Day of Surgery: Shower with the CHG Soap following the  instructions listed above. DO NOT apply deodorants or lotions. Please wear clean clothes to the hospital/surgery center.    Pacemaker Battery Change A pacemaker battery usually lasts 5-15 years (6-7 years on average). A few times a year, you will be asked to visit your health care provider to have a full evaluation of your pacemaker. When the battery is low, your pacemaker battery and generator will be completely replaced. Most often, this procedure is simpler than the first surgery because the wires (leads) that connect the generator to the heart are already in place. There are many things that affect how long a pacemaker battery will last, including:  The age of the pacemaker.  The number of leads you have(1, 2, or 3).  The pacemaker workload. If the pacemaker is helping the heart more often, the battery will not last as long.  Power (voltage) settings.  Tell a health care provider about:  Any allergies you have.  All medicines you are taking, including vitamins, herbs, eye drops, creams, and over-the-counter medicines.  Any problems you or family members have had with anesthetic medicines.  Any blood disorders you have.  Any surgeries you have had, especially the surgeries you have had since your last pacemaker was placed.  Any medical conditions you have.  Whether you are pregnant or may be pregnant.  Any symptoms of heart problems, such as chest pain, trouble breathing, palpitations, light-headedness, or feelings of an abnormal or irregular heartbeat.  Smoking habits. This can affect your reaction to anesthesia. What are the risks? Generally, this is a safe procedure. However, problems may occur, including:  Bleeding.  Bruising of the skin around where the surgical cut (incision) was made.  Pulling apart of the skin at the incision site.  Infection.  Nerve damage.  Injury to other organs, such as the lungs.  Allergic reaction to anesthetics or other medicines  used during the procedure.  People with diabetes may have a temporary increase in blood sugar (glucose) after any surgical procedure.  What happens before the procedure? Staying hydrated Follow instructions from your health care provider about hydration, which may include:  Up to 2 hours before the procedure - you may continue to drink clear liquids, such as water, clear fruit juice, black coffee, and plain tea.  Eating and drinking restrictions Follow instructions from your health care provider about eating and drinking restrictions, which may include:  8 hours before the procedure - stop eating heavy meals or foods such as meat, fried foods, or fatty foods.  6 hours before the procedure - stop eating light meals or foods, such as toast or cereal.  6 hours before the procedure - stop drinking milk or drinks that contain milk.  2 hours before the procedure - stop drinking clear liquids.  General instructions  Ask your health care provider about: ? Changing or stopping your regular medicines. This is especially  important if you are taking diabetes medicines or blood thinners. ? Taking medicines such as aspirin and ibuprofen. These medicines can thin your blood. Do not take these medicines before your procedure if your health care provider instructs you not to. ? Taking a sip of water with any approved medicines on the morning of the procedure.  Plan to have someone take you home after the procedure. What happens during the procedure?  To reduce your risk of infection: ? Your health care team will wash or sanitize their hands. ? The skin around the area of the chest will be washed with soap. ? Hair may be removed from the surgical area.  An IV tube will be inserted into one your veins to give you medicine and fluids.  You will be given one or more of the following: ? A medicine to help you relax (sedative). ? A medicine to numb the area where the pacemaker is located (local  anesthetic).  You may be given antibiotic medicine to prevent infection.  Your health care provider will make an incision to reopen the pocket holding the pacemaker.  The old pacemaker will be disconnected from the leads.  The leads will be tested.  If needed, the leads will be replaced. If the leads are functioning properly, the new pacemaker will be connected to the existing leads.  A heart monitor and the pacemaker programmer will be used to make sure that the newly implanted pacemaker is working properly.  The incision site will be closed. A bandage (dressing) will be placed over the pacemaker site. The procedure may vary among health care providers and hospitals. What happens after the procedure?  Your blood pressure, heart rate, breathing rate, and blood oxygen level will be monitored until your health care team is satisfied that your pacemaker is working properly.  Your health care provider will tell you when your pacemaker will need to be tested again, or when to return to the office for removal of dressing and stitches.  Do not drive for 24 hours if you were given a sedative.  The dressing will be removed 24-48 hours after the procedure, or as told by your health care provider. Summary  A pacemaker battery usually lasts 5-15 years (6-7 years on average).  When the battery is low, your pacemaker battery and generator will be completely replaced.  Risks of this procedure include bleeding, bruising, infection, damage to other structures, pulling apart of the skin at the incision site, and allergic reactions to medicines or anesthetics.  Most often, this procedure is simpler than the first surgery because the wires (leads) that connect the generator to the heart are already in place. This information is not intended to replace advice given to you by your health care provider. Make sure you discuss any questions you have with your health care provider. Document Released:  03/30/2006 Document Revised: 11/24/2015 Document Reviewed: 11/24/2015 Elsevier Interactive Patient Education  2017 [employer].    Supplemental Discharge Instructions for  Pacemaker/Defibrillator Patients  ACTIVITY Do not raise your left/right arm above shoulder level or extend it backward beyond shoulder level for 2 weeks. Wear the arm sling as a reminder or as needed for comfort for 2 weeks. No heavy lifting or vigorous activity with your left/right arm for 6-8 weeks.    NO DRIVING is preferable for 2 weeks; If absolutely necessary, drive only short, familiar routes. DO wear your seatbelt, even if it crosses over the pacemaker site.  WOUND CARE - Keep the  wound area clean and dry.  Remove the dressing the day after you return home (usually 48 hours after the procedure). - DO NOT SUBMERGE UNDER WATER UNTIL FULLY HEALED (no tub baths, hot tubs, swimming pools, etc.).  - You  may shower or take a sponge bath after the dressing is removed. DO NOT SOAK the area and do not allow the shower to directly spray on the site. - If you have staples, these will be removed in the office in 7-14 days. - If you have tape/steri-strips on your wound, these will fall off; do not pull them off prematurely.   - No bandage is needed on the site.  DO  NOT apply any creams, oils, or ointments to the wound area. - If you notice any drainage or discharge from the wound, any swelling, excessive redness or bruising at the site, or if you develop a fever > 101? F after you are discharged home, call the office at once.  SPECIAL INSTRUCTIONS - You are still able to use cellular telephones.  Avoid carrying your cellular phone near your device. - When traveling through airports, show security personnel your identification card to avoid being screened in the metal detectors.  - Avoid arc welding equipment, MRI testing (magnetic resonance imaging), TENS units (transcutaneous nerve stimulators).  Call the office for  questions about other devices. - Avoid electrical appliances that are in poor condition or are not properly grounded. - Microwave ovens are safe to be near or to operate.  DO NOT DRIVE YOURSELF OR A FAMILY MEMBER WITH A DEFIBRILLATOR TO THE HOSPITAL-CALL 911.     Signed, Thurmon Fair, MD  06/27/2017 9:59 AM    [facility] [address] [place], [place], [place]  [address] Phone: [phone]; Fax: [phone]

## 2017-06-27 NOTE — Patient Instructions (Addendum)
DEVICE IMPLANT PATIENT INSTRUCTIONS   PROCEDURE: Pacemaker Generator Changeout  DATE: Wednesday, July 17th, 2019  ARRIVAL TIME: 11:30 am   Please arrive to the Short Stay Center located at [address] [facility] [address] - Entrance "A." [facility].    You may eat an early, light breakfast but nothing to eat after 8:00 am.   Take your morning medications like you normally do with a sip of water.   You will need to have blood work completed prior to your procedure. Please have this done within 7 days prior to the procedure. There is a lab located in our office you can utilize if our location is convenient for you.   Plan for an overnight stay, bring your insurance cards, and a current list of your medications.   Wash your chest and neck with the surgical scrub provided or any brand of anti-bacterial soap the evening before and the morning of your procedure. Rinse well.   If you have any questions after you get home, please call the office at [phone].   Thank you,  Dr Rachelle HoraMihai Croitoru   *Special Note: Every effort is made to have your procedure done on time. Occasionally there are emergencies that present themselves at the hospital that may cause delays. Please be patient if a delay does occur.   Preparing for Surgery  Before surgery, you can play an important role. Because skin is not sterile, your skin needs to be as free of germs as possible. You can reduce the number of germs on your skin by washing with  CHG (chlorhexidine gluconate) Soap before surgery. CHG is an antiseptic cleaner which kills germs and bonds with the skin to continue killing germs even after washing.  Please do not use if you have an allergy to CHG or antibacterial soaps. If your skin becomes reddened/irritated, STOP using the CHG.  DO NOT SHAVE (including legs and underarms) for at least 48 hours prior to first CHG shower. It is OK to shave your face.  Please follow these  instructions carefully: 1. Shower the night before surgery and the morning of surgery with CHG Soap. 2. If you chose to wash your hair, wash your hair first as usual with your normal shampoo/conditioner. 3. After you shampoo/condition, rinse you hair and body thoroughly to remove shampoo/conditioner. 4. Use CHG as you would any other liquid soap. You can apply CHG directly to the skin and wash gently with a scrungie or a clean washcloth. 5. Apply the CHG Soap to your body ONLY FROM THE NECK DOWN. Do not use on open wounds or open sores. Avoid contact with your eyes, ears, mouth, and genitals (private parts). Wash genitals (private part) with your normal soap. 6. Wash thoroughly, paying special attention to the area where your surgery will be performed. 7. Thoroughly rinse your body with warm water from the neck down. 8. DO NOT shower/wash with your normal soap after using and rinsing off the CHG Soap. 9. Pat yourself dry with a clean towel. 10. Wear clean pajamas to bed. 11. Place clean sheets on your bed the night of your first shower and do not sleep with pets. Day of Surgery: Shower with the CHG Soap following the instructions listed above. DO NOT apply deodorants or lotions. Please wear clean clothes to the hospital/surgery center.    Pacemaker Battery Change A pacemaker battery usually lasts 5-15 years (6-7 years on average). A few times a year, you will  be asked to visit your health care provider to have a full evaluation of your pacemaker. When the battery is low, your pacemaker battery and generator will be completely replaced. Most often, this procedure is simpler than the first surgery because the wires (leads) that connect the generator to the heart are already in place. There are many things that affect how long a pacemaker battery will last, including:  The age of the pacemaker.  The number of leads you have(1, 2, or 3).  The pacemaker workload. If the pacemaker is helping the  heart more often, the battery will not last as long.  Power (voltage) settings.  Tell a health care provider about:  Any allergies you have.  All medicines you are taking, including vitamins, herbs, eye drops, creams, and over-the-counter medicines.  Any problems you or family members have had with anesthetic medicines.  Any blood disorders you have.  Any surgeries you have had, especially the surgeries you have had since your last pacemaker was placed.  Any medical conditions you have.  Whether you are pregnant or may be pregnant.  Any symptoms of heart problems, such as chest pain, trouble breathing, palpitations, light-headedness, or feelings of an abnormal or irregular heartbeat.  Smoking habits. This can affect your reaction to anesthesia. What are the risks? Generally, this is a safe procedure. However, problems may occur, including:  Bleeding.  Bruising of the skin around where the surgical cut (incision) was made.  Pulling apart of the skin at the incision site.  Infection.  Nerve damage.  Injury to other organs, such as the lungs.  Allergic reaction to anesthetics or other medicines used during the procedure.  People with diabetes may have a temporary increase in blood sugar (glucose) after any surgical procedure.  What happens before the procedure? Staying hydrated Follow instructions from your health care provider about hydration, which may include:  Up to 2 hours before the procedure - you may continue to drink clear liquids, such as water, clear fruit juice, black coffee, and plain tea.  Eating and drinking restrictions Follow instructions from your health care provider about eating and drinking restrictions, which may include:  8 hours before the procedure - stop eating heavy meals or foods such as meat, fried foods, or fatty foods.  6 hours before the procedure - stop eating light meals or foods, such as toast or cereal.  6 hours before the  procedure - stop drinking milk or drinks that contain milk.  2 hours before the procedure - stop drinking clear liquids.  General instructions  Ask your health care provider about: ? Changing or stopping your regular medicines. This is especially important if you are taking diabetes medicines or blood thinners. ? Taking medicines such as aspirin and ibuprofen. These medicines can thin your blood. Do not take these medicines before your procedure if your health care provider instructs you not to. ? Taking a sip of water with any approved medicines on the morning of the procedure.  Plan to have someone take you home after the procedure. What happens during the procedure?  To reduce your risk of infection: ? Your health care team will wash or sanitize their hands. ? The skin around the area of the chest will be washed with soap. ? Hair may be removed from the surgical area.  An IV tube will be inserted into one your veins to give you medicine and fluids.  You will be given one or more of the following: ? A  medicine to help you relax (sedative). ? A medicine to numb the area where the pacemaker is located (local anesthetic).  You may be given antibiotic medicine to prevent infection.  Your health care provider will make an incision to reopen the pocket holding the pacemaker.  The old pacemaker will be disconnected from the leads.  The leads will be tested.  If needed, the leads will be replaced. If the leads are functioning properly, the new pacemaker will be connected to the existing leads.  A heart monitor and the pacemaker programmer will be used to make sure that the newly implanted pacemaker is working properly.  The incision site will be closed. A bandage (dressing) will be placed over the pacemaker site. The procedure may vary among health care providers and hospitals. What happens after the procedure?  Your blood pressure, heart rate, breathing rate, and blood oxygen  level will be monitored until your health care team is satisfied that your pacemaker is working properly.  Your health care provider will tell you when your pacemaker will need to be tested again, or when to return to the office for removal of dressing and stitches.  Do not drive for 24 hours if you were given a sedative.  The dressing will be removed 24-48 hours after the procedure, or as told by your health care provider. Summary  A pacemaker battery usually lasts 5-15 years (6-7 years on average).  When the battery is low, your pacemaker battery and generator will be completely replaced.  Risks of this procedure include bleeding, bruising, infection, damage to other structures, pulling apart of the skin at the incision site, and allergic reactions to medicines or anesthetics.  Most often, this procedure is simpler than the first surgery because the wires (leads) that connect the generator to the heart are already in place. This information is not intended to replace advice given to you by your health care provider. Make sure you discuss any questions you have with your health care provider. Document Released: 03/30/2006 Document Revised: 11/24/2015 Document Reviewed: 11/24/2015 Elsevier Interactive Patient Education  2017 [employer].    Supplemental Discharge Instructions for  Pacemaker/Defibrillator Patients  ACTIVITY Do not raise your left/right arm above shoulder level or extend it backward beyond shoulder level for 2 weeks. Wear the arm sling as a reminder or as needed for comfort for 2 weeks. No heavy lifting or vigorous activity with your left/right arm for 6-8 weeks.    NO DRIVING is preferable for 2 weeks; If absolutely necessary, drive only short, familiar routes. DO wear your seatbelt, even if it crosses over the pacemaker site.  WOUND CARE - Keep the wound area clean and dry.  Remove the dressing the day after you return home (usually 48 hours after the  procedure). - DO NOT SUBMERGE UNDER WATER UNTIL FULLY HEALED (no tub baths, hot tubs, swimming pools, etc.).  - You  may shower or take a sponge bath after the dressing is removed. DO NOT SOAK the area and do not allow the shower to directly spray on the site. - If you have staples, these will be removed in the office in 7-14 days. - If you have tape/steri-strips on your wound, these will fall off; do not pull them off prematurely.   - No bandage is needed on the site.  DO  NOT apply any creams, oils, or ointments to the wound area. - If you notice any drainage or discharge from the wound, any swelling, excessive redness or  bruising at the site, or if you develop a fever > 101? F after you are discharged home, call the office at once.  SPECIAL INSTRUCTIONS - You are still able to use cellular telephones.  Avoid carrying your cellular phone near your device. - When traveling through airports, show security personnel your identification card to avoid being screened in the metal detectors.  - Avoid arc welding equipment, MRI testing (magnetic resonance imaging), TENS units (transcutaneous nerve stimulators).  Call the office for questions about other devices. - Avoid electrical appliances that are in poor condition or are not properly grounded. - Microwave ovens are safe to be near or to operate.  DO NOT DRIVE YOURSELF OR A FAMILY MEMBER WITH A DEFIBRILLATOR TO THE HOSPITAL-CALL 911.

## 2017-07-19 ENCOUNTER — Other Ambulatory Visit: Payer: Self-pay | Admitting: Cardiovascular Disease

## 2017-07-19 ENCOUNTER — Ambulatory Visit (HOSPITAL_COMMUNITY)
Admission: RE | Admit: 2017-07-19 | Discharge: 2017-07-19 | Disposition: A | Discharge status: Home / Self Care | Payer: Medicare Other | Source: Ambulatory Visit | Attending: Cardiovascular Disease | Admitting: Cardiovascular Disease

## 2017-07-19 ENCOUNTER — Encounter (HOSPITAL_COMMUNITY)
Admission: RE | Disposition: A | Discharge status: Home / Self Care | Payer: Self-pay | Source: Ambulatory Visit | Attending: Cardiovascular Disease

## 2017-07-19 ENCOUNTER — Other Ambulatory Visit: Payer: Self-pay

## 2017-07-19 DIAGNOSIS — I442 Atrioventricular block, complete: Secondary | ICD-10-CM | POA: Insufficient documentation

## 2017-07-19 DIAGNOSIS — I6522 Occlusion and stenosis of left carotid artery: Secondary | ICD-10-CM | POA: Insufficient documentation

## 2017-07-19 DIAGNOSIS — E785 Hyperlipidemia, unspecified: Secondary | ICD-10-CM | POA: Insufficient documentation

## 2017-07-19 DIAGNOSIS — D689 Coagulation defect, unspecified: Secondary | ICD-10-CM | POA: Diagnosis not present

## 2017-07-19 DIAGNOSIS — Z45010 Encounter for checking and testing of cardiac pacemaker pulse generator [battery]: Secondary | ICD-10-CM

## 2017-07-19 DIAGNOSIS — I2510 Atherosclerotic heart disease of native coronary artery without angina pectoris: Secondary | ICD-10-CM | POA: Diagnosis not present

## 2017-07-19 DIAGNOSIS — F0390 Unspecified dementia without behavioral disturbance: Secondary | ICD-10-CM | POA: Diagnosis not present

## 2017-07-19 DIAGNOSIS — N3281 Overactive bladder: Secondary | ICD-10-CM | POA: Diagnosis not present

## 2017-07-19 DIAGNOSIS — Z8614 Personal history of Methicillin resistant Staphylococcus aureus infection: Secondary | ICD-10-CM | POA: Insufficient documentation

## 2017-07-19 DIAGNOSIS — M5136 Other intervertebral disc degeneration, lumbar region: Secondary | ICD-10-CM | POA: Insufficient documentation

## 2017-07-19 DIAGNOSIS — Z8249 Family history of ischemic heart disease and other diseases of the circulatory system: Secondary | ICD-10-CM | POA: Diagnosis not present

## 2017-07-19 DIAGNOSIS — I5032 Chronic diastolic (congestive) heart failure: Secondary | ICD-10-CM | POA: Insufficient documentation

## 2017-07-19 DIAGNOSIS — I441 Atrioventricular block, second degree: Secondary | ICD-10-CM

## 2017-07-19 DIAGNOSIS — I110 Hypertensive heart disease with heart failure: Secondary | ICD-10-CM | POA: Diagnosis not present

## 2017-07-19 DIAGNOSIS — E1159 Type 2 diabetes mellitus with other circulatory complications: Secondary | ICD-10-CM | POA: Insufficient documentation

## 2017-07-19 HISTORY — PX: PPM GENERATOR CHANGEOUT: EP1233

## 2017-07-19 LAB — SURGICAL PCR SCREEN
MRSA, PCR: NEGATIVE
Staphylococcus aureus: NEGATIVE

## 2017-07-19 LAB — GLUCOSE, CAPILLARY: Glucose-Capillary: 146 mg/dL — ABNORMAL HIGH (ref 70–99)

## 2017-07-19 SURGERY — PPM GENERATOR CHANGEOUT

## 2017-07-19 MED ORDER — ONDANSETRON HCL 4 MG/2ML IJ SOLN: 4.0000 mg | Freq: Four times a day (QID) | INTRAMUSCULAR | Status: DC | PRN

## 2017-07-19 MED ORDER — LIDOCAINE HCL 1 % IJ SOLN
INTRAMUSCULAR | Status: AC
  Filled 2017-07-19: qty 20

## 2017-07-19 MED ORDER — MIDAZOLAM HCL 5 MG/5ML IJ SOLN
INTRAMUSCULAR | Status: DC | PRN
  Administered 2017-07-19: 1 mg via INTRAVENOUS

## 2017-07-19 MED ORDER — SODIUM CHLORIDE 0.9 % IV SOLN
INTRAVENOUS | Status: AC
  Filled 2017-07-19: qty 2

## 2017-07-19 MED ORDER — GENTAMICIN SULFATE 40 MG/ML IJ SOLN
80.0000 mg | INTRAMUSCULAR | Status: AC
  Administered 2017-07-19: 80 mg

## 2017-07-19 MED ORDER — HEPARIN (PORCINE) IN NACL 1000-0.9 UT/500ML-% IV SOLN
INTRAVENOUS | Status: AC
  Filled 2017-07-19: qty 500

## 2017-07-19 MED ORDER — SODIUM CHLORIDE 0.9% FLUSH: 3.0000 mL | INTRAVENOUS | Status: DC | PRN

## 2017-07-19 MED ORDER — CEFAZOLIN SODIUM-DEXTROSE 2-4 GM/100ML-% IV SOLN
2.0000 g | INTRAVENOUS | Status: AC
  Administered 2017-07-19: 2 g via INTRAVENOUS

## 2017-07-19 MED ORDER — LIDOCAINE HCL (PF) 1 % IJ SOLN
INTRAMUSCULAR | Status: DC | PRN
  Administered 2017-07-19: 35 mL

## 2017-07-19 MED ORDER — SODIUM CHLORIDE 0.9% FLUSH: 3.0000 mL | Freq: Two times a day (BID) | INTRAVENOUS | Status: DC

## 2017-07-19 MED ORDER — SODIUM CHLORIDE 0.9 % IV SOLN: 250.0000 mL | INTRAVENOUS | Status: DC | PRN

## 2017-07-19 MED ORDER — MUPIROCIN 2 % EX OINT
1.0000 "application " | TOPICAL_OINTMENT | Freq: Once | CUTANEOUS | Status: AC
  Administered 2017-07-19: 1 via TOPICAL

## 2017-07-19 MED ORDER — MIDAZOLAM HCL 5 MG/5ML IJ SOLN
INTRAMUSCULAR | Status: AC
  Filled 2017-07-19: qty 5

## 2017-07-19 MED ORDER — SODIUM CHLORIDE 0.9 % IV SOLN: INTRAVENOUS | Status: DC

## 2017-07-19 MED ORDER — CHLORHEXIDINE GLUCONATE 4 % EX LIQD: 60.0000 mL | Freq: Once | CUTANEOUS | Status: DC

## 2017-07-19 MED ORDER — MUPIROCIN 2 % EX OINT
TOPICAL_OINTMENT | CUTANEOUS | Status: AC
  Administered 2017-07-19: 13:00:00
  Filled 2017-07-19: qty 22

## 2017-07-19 MED ORDER — CEFAZOLIN SODIUM-DEXTROSE 2-4 GM/100ML-% IV SOLN
INTRAVENOUS | Status: AC
  Filled 2017-07-19: qty 100

## 2017-07-19 MED ORDER — LIDOCAINE HCL (PF) 1 % IJ SOLN
INTRAMUSCULAR | Status: AC
  Filled 2017-07-19: qty 60

## 2017-07-19 MED ORDER — ACETAMINOPHEN 325 MG PO TABS
325.0000 mg | ORAL_TABLET | ORAL | Status: DC | PRN
Start: 2017-07-19 — End: 2017-07-19

## 2017-07-19 SURGICAL SUPPLY — 6 items
CABLE SURGICAL S-101-97-12 (CABLE) ×3 IMPLANT
PACEMAKER ASSURITY DR-RF (Pacemaker) ×2 IMPLANT
PAD DEFIB LIFELINK (PAD) ×2 IMPLANT
POUCH AIGIS-R ANTIBACT ICD (Mesh General) ×3 IMPLANT
POUCH AIGIS-R ANTIBACT ICD LRG (Mesh General) IMPLANT
TRAY PACEMAKER INSERTION (PACKS) ×3 IMPLANT

## 2017-07-19 NOTE — Progress Notes (Signed)
Iv team unable to get Iv started [facility] called

## 2017-07-19 NOTE — Interval H&P Note (Signed)
History and Physical Interval Note:  07/19/2017 12:19 PM  [name]  has presented today for surgery, with the diagnosis of eri  The various methods of treatment have been discussed with the patient and family. After consideration of risks, benefits and other options for treatment, the patient has consented to  Procedure(s): PPM GENERATOR CHANGEOUT - dual chamber (N/A) as a surgical intervention .  The patient's history has been reviewed, patient examined, no change in status, stable for surgery.  I have reviewed the patient's chart and labs.  Questions were answered to the patient's satisfaction.     [name]

## 2017-07-19 NOTE — Op Note (Signed)
Procedure report  Procedure performed:  1. Dual chamber pacemaker generator changeout  2. Light sedation  Reason for procedure:  1. Device generator at elective replacement interval  2. 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  The patient's heart rate, blood pressure, and oxygen saturation are monitored continuously during the procedure. The period of conscious sedation is 20 minutes, of which I was present face-to-face 100% of this time. Device details:   [employer] Generator [employer] DR MRI model number [id number], serial number [id number] Right atrial lead (chronic) St. Jude [id number], serial number [id number] (implanted 05/13/2004) Right ventricular lead (chronic)  St. Jude [id number], serial number [id number] (implanted 05/13/2004)  Explanted generator St. Jude [employer] ,  model number [id number] (implanted 05/13/2004)  Procedure details:  After the risks and benefits of the procedure were discussed the patient provided informed consent. She was brought to the cardiac catheter lab in the fasting state. The patient was prepped and draped in usual sterile fashion. Local anesthesia with 1% lidocaine was administered to to the left infraclavicular area. A 5-6cm horizontal incision was made parallel with and 2-3 cm caudal to the left clavicle, in the area of an old scar. An older scar was seen closer to the left clavicle. Using minimal electrocautery and mostly sharp and blunt dissection the prepectoral pocket was opened carefully to avoid injury to the loops of chronic leads. Extensive dissection was not necessary. The device was explanted. The pocket was carefully  inspected for hemostasis and flushed with copious amounts of antibiotic solution.  The leads were disconnected from the old generator and testing of the lead parameters later showed excellent values. The new generator was connected to the chronic leads, with appropriate pacing noted.   The entire system was then carefully inserted in the pocket with care been taking that the leads and device assumed a comfortable position without pressure on the incision. Great care was taken that the leads be located deep to the generator. The pocket was then closed in layers using 2 layers of 2-0 Vicryl and cutaneous staples after which a sterile dressing was applied.   At the end of the procedure the following lead parameters were encountered:   Right atrial lead sensed P waves 1.2 mV, impedance 450 ohms, threshold 1.3 at 1.0 ms pulse width.  Right ventricular lead sensed R waves  5.7 mV, impedance 700 ohms, threshold 1.0 at 0.5 ms pulse width.  Thurmon FairMihai Stephaniemarie Stoffel, MD, [facility] [phone] office [phone] pager

## 2017-07-19 NOTE — Progress Notes (Signed)
Unable to place Iv- Iv team consult placed  Pt daughter states that she just had labs done on July 11 she called to have labs results  faxed

## 2017-07-19 NOTE — Discharge Instructions (Signed)

## 2017-07-20 ENCOUNTER — Encounter (HOSPITAL_COMMUNITY): Payer: Self-pay | Admitting: Cardiovascular Disease

## 2017-07-20 MED FILL — Heparin Sod (Porcine)-NaCl IV Soln 1000 Unit/500ML-0.9%: INTRAVENOUS | Qty: 500 | Status: AC

## 2017-07-26 ENCOUNTER — Other Ambulatory Visit: Payer: Self-pay | Admitting: Cardiovascular Disease

## 2017-07-31 ENCOUNTER — Ambulatory Visit (INDEPENDENT_AMBULATORY_CARE_PROVIDER_SITE_OTHER): Payer: Medicare Other | Admitting: *Deleted

## 2017-07-31 DIAGNOSIS — I442 Atrioventricular block, complete: Secondary | ICD-10-CM

## 2017-07-31 LAB — CUP PACEART INCLINIC DEVICE CHECK
Battery Remaining Longevity: 115 mo
Battery Voltage: 3.08 V
Implantable Lead Implant Date: 20060511
Implantable Lead Implant Date: 20060511
Implantable Lead Location: 753859
Implantable Pulse Generator Implant Date: 20190717
Lead Channel Impedance Value: 437.5 Ohm
Lead Channel Impedance Value: 687.5 Ohm
Lead Channel Pacing Threshold Amplitude: 0.75 V
Lead Channel Pacing Threshold Amplitude: 1.5 V
Lead Channel Pacing Threshold Pulse Width: 0.5 ms
Lead Channel Sensing Intrinsic Amplitude: 1.2 mV
Lead Channel Setting Pacing Amplitude: 1 V
Lead Channel Setting Pacing Amplitude: 2.5 V
Lead Channel Setting Pacing Pulse Width: 0.5 ms
Lead Channel Setting Sensing Sensitivity: 2 mV
MDC IDC LEAD LOCATION: 753860
MDC IDC MSMT LEADCHNL RA PACING THRESHOLD AMPLITUDE: 1.5 V
MDC IDC MSMT LEADCHNL RA PACING THRESHOLD PULSEWIDTH: 1 ms
MDC IDC MSMT LEADCHNL RA PACING THRESHOLD PULSEWIDTH: 1 ms
MDC IDC PG SERIAL: 9040451
MDC IDC SESS DTM: 20190729150139
MDC IDC STAT BRADY RA PERCENT PACED: 9 %
MDC IDC STAT BRADY RV PERCENT PACED: 99.98 %
Pulse Gen Model: 2272

## 2017-07-31 NOTE — Progress Notes (Signed)
Wound check appointment. Staples removed. Wound without redness or edema. Incision edges approximated, wound well healed. Normal device function. Thresholds, sensing, and impedances consistent with implant measurements. Device programmed at chronic outputs s/p gen change. Histogram distribution appropriate for patient and level of activity. No mode switches or high ventricular rates noted. Patient educated about wound care, arm mobility, lifting restrictions. [facility].

## 2017-10-25 ENCOUNTER — Encounter: Payer: Medicare Other | Admitting: Cardiovascular Disease

## 2017-11-29 ENCOUNTER — Ambulatory Visit (INDEPENDENT_AMBULATORY_CARE_PROVIDER_SITE_OTHER): Payer: Medicare Other | Admitting: Cardiovascular Disease

## 2017-11-29 ENCOUNTER — Encounter: Payer: Self-pay | Admitting: Cardiovascular Disease

## 2017-11-29 VITALS — BP 132/54 | HR 60 | Ht 63.0 in | Wt 141.4 lb

## 2017-11-29 DIAGNOSIS — Z950 Presence of cardiac pacemaker: Secondary | ICD-10-CM | POA: Diagnosis not present

## 2017-11-29 DIAGNOSIS — I5032 Chronic diastolic (congestive) heart failure: Secondary | ICD-10-CM

## 2017-11-29 DIAGNOSIS — I6522 Occlusion and stenosis of left carotid artery: Secondary | ICD-10-CM

## 2017-11-29 DIAGNOSIS — I442 Atrioventricular block, complete: Secondary | ICD-10-CM | POA: Diagnosis not present

## 2017-11-29 DIAGNOSIS — I10 Essential (primary) hypertension: Secondary | ICD-10-CM

## 2017-11-29 DIAGNOSIS — I2510 Atherosclerotic heart disease of native coronary artery without angina pectoris: Secondary | ICD-10-CM

## 2017-11-29 DIAGNOSIS — E1159 Type 2 diabetes mellitus with other circulatory complications: Secondary | ICD-10-CM

## 2017-11-29 NOTE — Progress Notes (Signed)
Patient ID: [name], female   DOB: 06/30/1923, 84 y.o.   MRN: [id number]    Cardiology Office Note    Date:  11/29/2017   ID:  [name], DOB [birth year], MRN [id number]  PCP:  Keane Police, MD  Cardiologist:   Thurmon Fair, MD   No chief complaint on file.   History of Present Illness:  [name] is a 84 y.o. 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   She has dementia and lives in a nursing home [facility]) due to problems of dementia. Accompanied by her daughter today, as always.  This is her first pacemaker check since undergoing generator replacement in July 2019. The site has healed very well.  The patient specifically denies any chest pain at rest exertion, dyspnea at rest or with exertion, orthopnea, paroxysmal nocturnal dyspnea, syncope, palpitations, focal neurological deficits, intermittent claudication, lower extremity edema, unexplained weight gain, cough, hemoptysis or wheezing.  Her weight loss has stabilized.  Her cognitive deficits are moderate not much changed.   Device interrogation shows normal/expected findings.  Generator longevity is estimated at 8-8.6 years.  The atrial pacing threshold remains relatively high at 1.65 V at 1.0 ms.  Atrial sensing is so mediocre with P waves 0.5-1.5 mV, but this is really not interfering with device function.  She requires atrial pacing 38% of the time and is always pacing the ventricle, a 100% of the time.  The underlying rhythm today is severe sinus bradycardia at 32 bpm with first-degree AV block at 273 ms and 1: 1 AV conduction.  At other times she has idioventricular escape rhythm in the 30s.  She has not had any episodes of mode switch.  She has previous undergone a left carotid endarterectomy and has hypertension and type 2 diabetes mellitus.  Her most recent hemoglobin A1c was 5.9% and her LDL cholesterol  was 58. Labs from December 08, 2016)   Past Medical History:  Diagnosis Date  . CAD (coronary artery disease)   . CHB (complete heart block) (HCC)    Permanent pacemaker- [employer]ude  . CHF (congestive heart failure) (HCC)   . Coronary artery stenosis   . DDD (degenerative disc disease), lumbar   . Diabetes mellitus 2004   type 2  . Hyperlipidemia   . Hypertension   . MRSA infection 2007   X 2  . Nephrolithiasis   . OAB (overactive bladder)   . Osteoarthritis     Past Surgical History:  Procedure Laterality Date  . ABDOMINAL HYSTERECTOMY    . ANKLE SURGERY    . APPENDECTOMY    . BACK SURGERY     X 2  . CARDIAC CATHETERIZATION  09/01/2004   diffuse small vessel disease w/large vessel 70% mid LAD stenosis  . cataract surgery    . CEA  4-07  . CHOLECYSTECTOMY    . kidney stone removal    . Left hip replacement  6-07  . LUMBAR FUSION    . NM MYOCAR PERF WALL MOTION  08/18/2008   normal  . PACEMAKER INSERTION  05/13/2004   [employer]Jude  . PPM GENERATOR CHANGEOUT N/A 07/19/2017   Procedure: PPM GENERATOR CHANGEOUT - dual chamber;  Surgeon: Thurmon Fair, MD;  Location: [facility];  Service: Cardiovascular;  Laterality: N/A;  . SKIN CANCER EXCISION  03/2014   right chest area  . US ECHOCARDIOGRAPHY  08/06/2008   Grade I diastolic dysfunction,trace TR,ca+ MV  Outpatient Medications Prior to Visit  Medication Sig Dispense Refill  . acetaminophen (TYLENOL) 325 MG tablet Take 650 mg by mouth every 4 (four) hours as needed for moderate pain or headache.     . ALPRAZolam (XANAX) 0.25 MG tablet Take 0.25 mg by mouth daily as needed for anxiety.    Marland Kitchen buPROPion (WELLBUTRIN SR) 200 MG 12 hr tablet Take 200 mg by mouth 2 (two) times daily.      Marland Kitchen doxepin (SINEQUAN) 25 MG capsule Take 25 mg by mouth at bedtime.    Marland Kitchen esomeprazole (NEXIUM) 40 MG capsule Take 40 mg by mouth 2 (two) times daily.      Marland Kitchen estradiol (ESTRACE) 0.1 MG/GM vaginal cream Place 2 g vaginally 2 (two) times a week.      . gabapentin (NEURONTIN) 300 MG capsule Take 300 mg by mouth at bedtime.     Marland Kitchen HYDROcodone-acetaminophen (NORCO/VICODIN) 5-325 MG tablet Take 1 tablet by mouth 2 (two) times daily.    Marland Kitchen lisinopril (PRINIVIL,ZESTRIL) 10 MG tablet Take 10 mg by mouth daily.    Marland Kitchen loratadine (CLARITIN) 10 MG tablet Take 10 mg by mouth daily.      . meloxicam (MOBIC) 7.5 MG tablet Take 7.5 mg by mouth daily.    . nitrofurantoin, macrocrystal-monohydrate, (MACROBID) 100 MG capsule Take 100 mg by mouth daily.    . nitroGLYCERIN (NITROSTAT) 0.4 MG SL tablet Place 0.4 mg under the tongue every 5 (five) minutes as needed for chest pain.     Marland Kitchen ondansetron (ZOFRAN) 4 MG tablet Take 4 mg by mouth every 8 (eight) hours as needed for nausea or vomiting.    . polyethylene glycol (MIRALAX / GLYCOLAX) packet Take 51 g by mouth daily. Take 3 capful in 8 oz of liquid daily until you have a large bowel movement.  Reduce back to 1 capful a day once you have a normal stool. (hold for diarrhea) (Patient taking differently: Take 17 g by mouth daily. ) 14 each 1  . simethicone (GAS-X) 80 MG chewable tablet Chew 80 mg by mouth every 4 (four) hours as needed (for gas pain).     Marland Kitchen sitaGLIPtin (JANUVIA) 50 MG tablet Take 50 mg by mouth daily.     . bisacodyl (DULCOLAX) 5 MG EC tablet Take 10 mg by mouth daily as needed for moderate constipation.     Marland Kitchen ibuprofen (ADVIL,MOTRIN) 600 MG tablet Take 600 mg by mouth every 8 (eight) hours as needed for headache or moderate pain.      No facility-administered medications prior to visit.      Allergies:   Codeine sulfate and Prevacid [lansoprazole]   Social History   Socioeconomic History  . Marital status: Widowed    Spouse name: Not on file  . Number of children: Not on file  . Years of education: Not on file  . Highest education level: Not on file  Occupational History  . Not on file  Social Needs  . Financial resource strain: Not on file  . Food insecurity:    Worry: Not on file     Inability: Not on file  . Transportation needs:    Medical: Not on file    Non-medical: Not on file  Tobacco Use  . Smoking status: Never Smoker  . Smokeless tobacco: Never Used  Substance and Sexual Activity  . Alcohol use: No  . Drug use: Not on file  . Sexual activity: Not on file  Lifestyle  . Physical activity:  Days per week: Not on file    Minutes per session: Not on file  . Stress: Not on file  Relationships  . Social connections:    Talks on phone: Not on file    Gets together: Not on file    Attends religious service: Not on file    Active member of club or organization: Not on file    Attends meetings of clubs or organizations: Not on file    Relationship status: Not on file  Other Topics Concern  . Not on file  Social History Narrative  . Not on file     Family History:  The patient's family history includes Cancer in her father and sister; Diabetes in her mother; Heart attack in her mother; Hypertension in her mother.   ROS:   Please see the history of present illness.    ROS All other systems are reviewed and are negative   PHYSICAL EXAM:   VS:  BP (!) 132/54   Pulse 60   Ht 5\' 3"  (1.6 m)   Wt 141 lb 6.4 oz (64.1 kg)   BMI 25.05 kg/m     General: Alert, oriented x3, no distress, elderly Head: no evidence of trauma, PERRL, EOMI, no exophtalmos or lid lag, no myxedema, no xanthelasma; normal ears, nose and oropharynx.  Edentulous Neck: normal jugular venous pulsations and no hepatojugular reflux; brisk carotid pulses without delay and no carotid bruits Chest: clear to auscultation, no signs of consolidation by percussion or palpation, normal fremitus, symmetrical and full respiratory excursions.  Healthy left subclavian pacemaker site Cardiovascular: normal position and quality of the apical impulse, regular rhythm, normal first and paradoxically split second heart sounds, no murmurs, rubs or gallops Abdomen: no tenderness or distention, no masses by  palpation, no abnormal pulsatility or arterial bruits, normal bowel sounds, no hepatosplenomegaly Extremities: no clubbing, cyanosis or edema; 2+ radial, ulnar and brachial pulses bilaterally; 2+ right femoral, posterior tibial and dorsalis pedis pulses; 2+ left femoral, posterior tibial and dorsalis pedis pulses; no subclavian or femoral bruits Neurological: grossly nonfocal Psych: Normal mood and affect   Wt Readings from Last 3 Encounters:  11/29/17 141 lb 6.4 oz (64.1 kg)  07/19/17 136 lb (61.7 kg)  06/27/17 133 lb (60.3 kg)      Studies/Labs Reviewed:   EKG:  EKG is ordered today.  It shows AV sequential pacing  Recent Labs: August 2018 creatinine 0.66, potassium 3.8, glucose 140, hemoglobin 12.8  Lipid Panel 12/10/2015 total cholesterol 132, triglycerides 149, HDL 43, LDL 59 .lipidp ASSESSMENT:    1. CHB (complete heart block) (HCC)   2. Pacemaker   3. Essential hypertension   4. Chronic diastolic heart failure (HCC)   5. Atherosclerosis of native coronary artery of native heart without angina pectoris   6. Stenosis of left carotid artery   7. Controlled type 2 diabetes mellitus with other circulatory complication, without long-term current use of insulin (HCC)      PLAN:  In order of problems listed above:  1. 3rd deg AVBlock: Although a slow escape rhythm is present, this may not be reliable.  Although she conducts normally, 1: 1 via the AV node at heart rates of 30 bpm she should be considered device dependent. 2. PPM: Although she has less than perfect parameters are atrial lead, it was felt preferable to just do a generator change rather than risk complications with the revision in this elderly woman with cognitive deficits.  Device function is not significantly affected.  Plan remote downloads every 3 months and office visit yearly 3. HTN: The family brought a detailed list of her blood pressures at the nursing home.  Typically her blood pressure is in the 140/70  range.  Occasionally she has low blood pressure is down to 80/46 mmHg and has been symptomatic.  At other times her blood pressure has been elevated, as high as 188/100.  Hard to find a happy medium, but I did not make any changes to her medications today.  If she has falls or syncope due to hypotension would recommend adding back her lisinopril.  She is to be on a lot more blood pressure medicine when she was overweight. 4. CHF: She has not required diuretics since reaching her current weight with a BMI of 25. 5. CAD: Angina free.  Not on statin or aspirin.  No plans for aggressive/invasive procedures if she should develop symptoms. 6. Carotid stenosis: Left carotid endarterectomy site patent by Duplex US 2013. No routine monitoring due to advanced age and memory problems. 7. DM: Excellent control according to the family 8. Dementia: The family reinforces their desire for conservative management without aggressive procedure and trying to limit the number of her medications as much as possible.    Medication Adjustments/Labs and Tests Ordered: Current medicines are reviewed at length with the patient today.  Concerns regarding medicines are outlined above.  Medication changes, Labs and Tests ordered today are listed in the Patient Instructions below. Patient Instructions  Medication Instructions:  Dr Royann Shiversroitoru recommends that you continue on your current medications as directed. Please refer to the Current Medication list given to you today.  If you need a refill on your cardiac medications before your next appointment, please call your pharmacy.   Testing/Procedures: Remote monitoring is used to monitor your Pacemaker of ICD from home. This monitoring reduces the number of office visits required to check your device to one time per year. It allows us to keep an eye on the functioning of your device to ensure it is working properly. You are scheduled for a device check from home on Wednesday,  February 26th, 2020. You may send your transmission at any time that day. If you have a wireless device, the transmission will be sent automatically. After your physician reviews your transmission, you will receive a postcard with your next transmission date.  Follow-Up: At [facility], you and your health needs are our priority.  As part of our continuing mission to provide you with exceptional heart care, we have created designated Provider Care Teams.  These Care Teams include your primary Cardiologist (physician) and Advanced Practice Providers (APPs -  Physician Assistants and Nurse Practitioners) who all work together to provide you with the care you need, when you need it. You will need a follow up appointment in 12 months.  Please call our office 2 months in advance to schedule this appointment.  You may see Thurmon FairMihai Artemio Dobie, MD or one of the following Advanced Practice Providers on your designated Care Team: [place]ao Meng, [place]PA-C . Micah FlesherAngela Duke, PA-C   Signed, Thurmon FairMihai Delmus Warwick, MD  11/29/2017 2:09 PM    [facility] [address], [place], [place]  [address] Phone: [phone](336) [phone]; Fax: [phone]) [phone]

## 2017-11-29 NOTE — Patient Instructions (Signed)
Medication Instructions:  Dr Royann Shiversroitoru recommends that you continue on your current medications as directed. Please refer to the Current Medication list given to you today.  If you need a refill on your cardiac medications before your next appointment, please call your pharmacy.   Testing/Procedures: Remote monitoring is used to monitor your Pacemaker of ICD from home. This monitoring reduces the number of office visits required to check your device to one time per year. It allows us to keep an eye on the functioning of your device to ensure it is working properly. You are scheduled for a device check from home on Wednesday, February 26th, 2020. You may send your transmission at any time that day. If you have a wireless device, the transmission will be sent automatically. After your physician reviews your transmission, you will receive a postcard with your next transmission date.  Follow-Up: At [facility], you and your health needs are our priority.  As part of our continuing mission to provide you with exceptional heart care, we have created designated Provider Care Teams.  These Care Teams include your primary Cardiologist (physician) and Advanced Practice Providers (APPs -  Physician Assistants and Nurse Practitioners) who all work together to provide you with the care you need, when you need it. You will need a follow up appointment in 12 months.  Please call our office 2 months in advance to schedule this appointment.  You may see Thurmon FairMihai Croitoru, MD or one of the following Advanced Practice Providers on your designated Care Team: [place]ao Meng, [place]PA-C . Micah FlesherAngela Duke, PA-C

## 2018-01-29 LAB — CUP PACEART INCLINIC DEVICE CHECK
Date Time Interrogation Session: 20200127144729
Implantable Lead Implant Date: 20060511
Implantable Lead Location: 753859
Implantable Pulse Generator Implant Date: 20190717
MDC IDC LEAD IMPLANT DT: 20060511
MDC IDC LEAD LOCATION: 753860
MDC IDC PG SERIAL: 9040451

## 2018-02-28 ENCOUNTER — Ambulatory Visit (INDEPENDENT_AMBULATORY_CARE_PROVIDER_SITE_OTHER): Payer: Medicare Other | Admitting: *Deleted

## 2018-02-28 DIAGNOSIS — I442 Atrioventricular block, complete: Secondary | ICD-10-CM | POA: Diagnosis not present

## 2018-03-02 LAB — CUP PACEART REMOTE DEVICE CHECK
Battery Remaining Longevity: 79 mo
Battery Remaining Percentage: 95.5 %
Battery Voltage: 3.01 V
Brady Statistic AP VS Percent: 1 %
Brady Statistic AS VS Percent: 1 %
Date Time Interrogation Session: 20200226070013
Implantable Lead Implant Date: 20060511
Implantable Lead Location: 753859
Implantable Pulse Generator Implant Date: 20190717
Lead Channel Impedance Value: 410 Ohm
Lead Channel Pacing Threshold Amplitude: 0.75 V
Lead Channel Pacing Threshold Amplitude: 1.375 V
Lead Channel Pacing Threshold Pulse Width: 0.5 ms
Lead Channel Sensing Intrinsic Amplitude: 1.4 mV
Lead Channel Setting Pacing Amplitude: 1 V
Lead Channel Setting Pacing Pulse Width: 0.5 ms
Lead Channel Setting Sensing Sensitivity: 2 mV
MDC IDC LEAD IMPLANT DT: 20060511
MDC IDC LEAD LOCATION: 753860
MDC IDC MSMT LEADCHNL RA PACING THRESHOLD PULSEWIDTH: 1 ms
MDC IDC MSMT LEADCHNL RV IMPEDANCE VALUE: 700 Ohm
MDC IDC MSMT LEADCHNL RV SENSING INTR AMPL: 6 mV
MDC IDC PG SERIAL: 9040451
MDC IDC SET LEADCHNL RA PACING AMPLITUDE: 3 V
MDC IDC STAT BRADY AP VP PERCENT: 29 %
MDC IDC STAT BRADY AS VP PERCENT: 71 %
MDC IDC STAT BRADY RA PERCENT PACED: 29 %
MDC IDC STAT BRADY RV PERCENT PACED: 99 %

## 2018-03-07 ENCOUNTER — Encounter: Payer: Self-pay | Admitting: Cardiology

## 2018-03-07 NOTE — Progress Notes (Signed)
Remote pacemaker transmission.   

## 2018-05-30 ENCOUNTER — Ambulatory Visit (INDEPENDENT_AMBULATORY_CARE_PROVIDER_SITE_OTHER): Payer: Medicare Other | Admitting: *Deleted

## 2018-05-30 DIAGNOSIS — I442 Atrioventricular block, complete: Secondary | ICD-10-CM

## 2018-05-30 LAB — CUP PACEART REMOTE DEVICE CHECK
Date Time Interrogation Session: 20200527124558
Implantable Lead Implant Date: 20060511
Implantable Lead Implant Date: 20060511
Implantable Lead Location: 753859
Implantable Lead Location: 753860
Implantable Pulse Generator Implant Date: 20190717
Pulse Gen Model: 2272
Pulse Gen Serial Number: 9040451

## 2018-06-08 ENCOUNTER — Encounter: Payer: Self-pay | Admitting: Cardiology

## 2018-06-08 NOTE — Progress Notes (Signed)
Remote pacemaker transmission.   

## 2018-08-29 ENCOUNTER — Ambulatory Visit (INDEPENDENT_AMBULATORY_CARE_PROVIDER_SITE_OTHER): Payer: Medicare Other | Admitting: *Deleted

## 2018-08-29 DIAGNOSIS — I442 Atrioventricular block, complete: Secondary | ICD-10-CM

## 2018-08-29 LAB — CUP PACEART REMOTE DEVICE CHECK
Battery Remaining Longevity: 84 mo
Battery Remaining Percentage: 95.5 %
Battery Voltage: 3.01 V
Brady Statistic AP VP Percent: 24 %
Brady Statistic AP VS Percent: 1 %
Brady Statistic AS VP Percent: 76 %
Brady Statistic AS VS Percent: 1 %
Brady Statistic RA Percent Paced: 24 %
Brady Statistic RV Percent Paced: 99 %
Date Time Interrogation Session: 20200826060014
Implantable Lead Implant Date: 20060511
Implantable Lead Implant Date: 20060511
Implantable Lead Location: 753859
Implantable Lead Location: 753860
Implantable Pulse Generator Implant Date: 20190717
Lead Channel Impedance Value: 430 Ohm
Lead Channel Impedance Value: 660 Ohm
Lead Channel Pacing Threshold Amplitude: 0.875 V
Lead Channel Pacing Threshold Amplitude: 1.375 V
Lead Channel Pacing Threshold Pulse Width: 0.5 ms
Lead Channel Pacing Threshold Pulse Width: 1 ms
Lead Channel Sensing Intrinsic Amplitude: 1.7 mV
Lead Channel Sensing Intrinsic Amplitude: 7.7 mV
Lead Channel Setting Pacing Amplitude: 1.125
Lead Channel Setting Pacing Amplitude: 3 V
Lead Channel Setting Pacing Pulse Width: 0.5 ms
Lead Channel Setting Sensing Sensitivity: 2 mV
Pulse Gen Model: 2272
Pulse Gen Serial Number: 9040451

## 2018-09-07 ENCOUNTER — Encounter: Payer: Self-pay | Admitting: Cardiology

## 2018-09-07 NOTE — Progress Notes (Signed)
Remote pacemaker transmission.   

## 2018-11-28 ENCOUNTER — Ambulatory Visit (INDEPENDENT_AMBULATORY_CARE_PROVIDER_SITE_OTHER): Payer: Medicare Other | Admitting: *Deleted

## 2018-11-28 ENCOUNTER — Telehealth: Payer: Self-pay | Admitting: Cardiovascular Disease

## 2018-11-28 DIAGNOSIS — I442 Atrioventricular block, complete: Secondary | ICD-10-CM | POA: Diagnosis not present

## 2018-11-28 NOTE — Telephone Encounter (Signed)
Will route to MD. Patient is 13.

## 2018-11-28 NOTE — Telephone Encounter (Signed)
Left a message for the patient's daughter to call back to see if they would be willing to do a virtual appointment.

## 2018-11-28 NOTE — Telephone Encounter (Signed)
Patient's daughter states that her mother is in assisted living and in a wheelchair so she will need to come with her to her appt on 12/03/18 with Dr. Sallyanne Kuster.

## 2018-11-28 NOTE — Telephone Encounter (Signed)
Patient's sister following up to see if she can come to the appt. States she's not sure how her sister will be able to do a virtual appt beings that she is in assisted living and she cannot be there with her.

## 2018-11-28 NOTE — Telephone Encounter (Signed)
Patient is living at an assisted living. Ms [name] will be with her  For appointment . She will  Be her transport from the facility .  will place a note on appointment

## 2018-11-30 LAB — CUP PACEART REMOTE DEVICE CHECK
Battery Remaining Longevity: 85 mo
Battery Remaining Percentage: 95.5 %
Battery Voltage: 2.98 V
Brady Statistic AP VP Percent: 23 %
Brady Statistic AP VS Percent: 1 %
Brady Statistic AS VP Percent: 77 %
Brady Statistic AS VS Percent: 1 %
Brady Statistic RA Percent Paced: 23 %
Brady Statistic RV Percent Paced: 99 %
Date Time Interrogation Session: 20201125020012
Implantable Lead Implant Date: 20060511
Implantable Lead Implant Date: 20060511
Implantable Lead Location: 753859
Implantable Lead Location: 753860
Implantable Pulse Generator Implant Date: 20190717
Lead Channel Impedance Value: 410 Ohm
Lead Channel Impedance Value: 690 Ohm
Lead Channel Pacing Threshold Amplitude: 0.75 V
Lead Channel Pacing Threshold Amplitude: 1.25 V
Lead Channel Pacing Threshold Pulse Width: 0.5 ms
Lead Channel Pacing Threshold Pulse Width: 1 ms
Lead Channel Sensing Intrinsic Amplitude: 1.2 mV
Lead Channel Sensing Intrinsic Amplitude: 6.4 mV
Lead Channel Setting Pacing Amplitude: 1 V
Lead Channel Setting Pacing Amplitude: 3 V
Lead Channel Setting Pacing Pulse Width: 0.5 ms
Lead Channel Setting Sensing Sensitivity: 2 mV
Pulse Gen Model: 2272
Pulse Gen Serial Number: 9040451

## 2018-12-03 ENCOUNTER — Ambulatory Visit (INDEPENDENT_AMBULATORY_CARE_PROVIDER_SITE_OTHER): Payer: Medicare Other | Admitting: Cardiovascular Disease

## 2018-12-03 ENCOUNTER — Encounter: Payer: Self-pay | Admitting: Cardiovascular Disease

## 2018-12-03 ENCOUNTER — Other Ambulatory Visit: Payer: Self-pay

## 2018-12-03 VITALS — BP 140/63 | HR 83 | Temp 96.7°F | Ht 63.0 in | Wt 144.4 lb

## 2018-12-03 DIAGNOSIS — Z950 Presence of cardiac pacemaker: Secondary | ICD-10-CM | POA: Diagnosis not present

## 2018-12-03 DIAGNOSIS — Z9889 Other specified postprocedural states: Secondary | ICD-10-CM

## 2018-12-03 DIAGNOSIS — I10 Essential (primary) hypertension: Secondary | ICD-10-CM | POA: Diagnosis not present

## 2018-12-03 DIAGNOSIS — I442 Atrioventricular block, complete: Secondary | ICD-10-CM

## 2018-12-03 DIAGNOSIS — I2510 Atherosclerotic heart disease of native coronary artery without angina pectoris: Secondary | ICD-10-CM

## 2018-12-03 DIAGNOSIS — F0390 Unspecified dementia without behavioral disturbance: Secondary | ICD-10-CM

## 2018-12-03 DIAGNOSIS — I5032 Chronic diastolic (congestive) heart failure: Secondary | ICD-10-CM | POA: Diagnosis not present

## 2018-12-03 DIAGNOSIS — E119 Type 2 diabetes mellitus without complications: Secondary | ICD-10-CM

## 2018-12-03 NOTE — Patient Instructions (Signed)
Medication Instructions:  No chnages *If you need a refill on your cardiac medications before your next appointment, please call your pharmacy*  Lab Work: None ordered If you have labs (blood work) drawn today and your tests are completely normal, you will receive your results only by: . MyChart Message (if you have MyChart) OR . A paper copy in the mail If you have any lab test that is abnormal or we need to change your treatment, we will call you to review the results.  Testing/Procedures: None ordered  Follow-Up: At [facility], you and your health needs are our priority.  As part of our continuing mission to provide you with exceptional heart care, we have created designated Provider Care Teams.  These Care Teams include your primary Cardiologist (physician) and Advanced Practice Providers (APPs -  Physician Assistants and Nurse Practitioners) who all work together to provide you with the care you need, when you need it.  Your next appointment:   12 month(s)  The format for your next appointment:   In Person  Provider:   Mihai Croitoru, MD  

## 2018-12-03 NOTE — Progress Notes (Signed)
Patient ID: [name], female   DOB: [birth year], 85 y.o.   MRN: [id number]    Cardiology Office Note    Date:  12/03/2018   ID:  Alexys, Lobello [birth year], MRN [id number]  PCP:  Keane Police, MD  Cardiologist:   Thurmon Fair, MD   Chief Complaint  Patient presents with  . Pacemaker Check    History of Present Illness:  [name] is a 85 y.o. 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  Her current device was a generator change out in 2019 ([employer]).  She has dementia and lives in a nursing home [facility]) due to problems of dementia. Accompanied by her daughter today, as always.  The patient and her daughter make it clear that she has DNR status. The focus is clearly shifting to palliation of symptoms rather than aggressive care.  During the lockdown during the pandemic she was in isolation and became dehydrated, requiring brief hospitalization and intravenous fluids, followed by rehabilitation.  She is lost about 40 pounds.  She no longer requires antihypertensive medications (stopped lisinopril).    Recently she was receiving steroids for left shoulder arthritis and during that time developed some swelling in her right leg and right arm.  This has resolved.  The patient specifically denies any chest pain at rest exertion, dyspnea at rest or with exertion, orthopnea, paroxysmal nocturnal dyspnea, syncope, palpitations, focal neurological deficits, intermittent claudication, lower extremity edema, unexplained weight gain, cough, hemoptysis or wheezing.  Comprehensive device interrogation today shows normal function.  She presents with atrial paced, ventricular paced rhythm.  Battery longevity suggest 5.5-8.7 years remaining.  The atrial lead parameters have been slowly deteriorating over the years and today we reduced atrial sensitivity to 0.4 mV and  increase the atrial pacing output to 3.75 V at 1.0 ms pulse width, because of this worsening.  She only has 23% atrial pacing but has 100% ventricular pacing.  3 extremely brief episodes of atrial fibrillation not recorded lasting 12 seconds, 14 seconds and 24 seconds respectively.  These all occurred around the time of her acute illness in May/June.  Device interrogation shows normal/expected findings.  Generator longevity is estimated at 8-8.6 years.  The atrial pacing threshold remains relatively high at 1.65 V at 1.0 ms.  Atrial sensing is so mediocre with P waves 0.5-1.5 mV, but this is really not interfering with device function.  She requires atrial pacing 38% of the time and is always pacing the ventricle, a 100% of the time.  The underlying rhythm today is severe sinus bradycardia at 32 bpm with first-degree AV block at 273 ms and 1: 1 AV conduction.  At other times she has idioventricular escape rhythm in the 30s.  She has not had any episodes of mode switch.  She has previous undergone a left carotid endarterectomy and has hypertension and type 2 diabetes mellitus.     Past Medical History:  Diagnosis Date  . CAD (coronary artery disease)   . CHB (complete heart block) (HCC)    Permanent pacemaker- St.Jude  . CHF (congestive heart failure) (HCC)   . Coronary artery stenosis   . DDD (degenerative disc disease), lumbar   . Diabetes mellitus 2004   type 2  . Hyperlipidemia   . Hypertension   . MRSA infection 2007   X 2  . Nephrolithiasis   . OAB (overactive bladder)   . Osteoarthritis  Past Surgical History:  Procedure Laterality Date  . ABDOMINAL HYSTERECTOMY    . ANKLE SURGERY    . APPENDECTOMY    . BACK SURGERY     X 2  . CARDIAC CATHETERIZATION  09/01/2004   diffuse small vessel disease w/large vessel 70% mid LAD stenosis  . cataract surgery    . CEA  4-07  . CHOLECYSTECTOMY    . kidney stone removal    . Left hip replacement  6-07  . LUMBAR FUSION    . NM  MYOCAR PERF WALL MOTION  08/18/2008   normal  . PACEMAKER INSERTION  05/13/2004   St.Jude  . PPM GENERATOR CHANGEOUT N/A 07/19/2017   Procedure: PPM GENERATOR CHANGEOUT - dual chamber;  Surgeon: Thurmon Fairroitoru, Naturi Alarid, MD;  Location: [facility];  Service: Cardiovascular;  Laterality: N/A;  . SKIN CANCER EXCISION  03/2014   right chest area  . US ECHOCARDIOGRAPHY  08/06/2008   Grade I diastolic dysfunction,trace TR,ca+ MV    Outpatient Medications Prior to Visit  Medication Sig Dispense Refill  . acetaminophen (TYLENOL) 325 MG tablet Take 650 mg by mouth every 4 (four) hours as needed for moderate pain or headache.     . ALPRAZolam (XANAX) 0.25 MG tablet Take 0.25 mg by mouth daily as needed for anxiety.    Marland Kitchen. aspirin (ASPIRIN 81) 81 MG chewable tablet Chew 81 mg by mouth daily.    . cetirizine (ZYRTEC) 10 MG tablet Take by mouth.    . doxepin (SINEQUAN) 25 MG capsule Take 25 mg by mouth at bedtime.    Marland Kitchen. esomeprazole (NEXIUM) 40 MG capsule Take 40 mg by mouth 2 (two) times daily.      Marland Kitchen. estradiol (ESTRACE) 0.1 MG/GM vaginal cream Place 2 g vaginally 2 (two) times a week.     Marland Kitchen. HYDROcodone-acetaminophen (NORCO/VICODIN) 5-325 MG tablet Take 1 tablet by mouth 2 (two) times daily.    Marland Kitchen. ketoconazole (NIZORAL) 2 % cream     . meloxicam (MOBIC) 15 MG tablet Take 15 mg by mouth daily.    . Multiple Vitamin (MULTIVITAMIN) tablet Take 1 tablet by mouth daily.    . nitrofurantoin, macrocrystal-monohydrate, (MACROBID) 100 MG capsule Take 100 mg by mouth daily.    . nitroGLYCERIN (NITROSTAT) 0.4 MG SL tablet Place 0.4 mg under the tongue every 5 (five) minutes as needed for chest pain.     Marland Kitchen. ondansetron (ZOFRAN) 4 MG tablet Take 4 mg by mouth every 8 (eight) hours as needed for nausea or vomiting.    . polyethylene glycol (MIRALAX / GLYCOLAX) packet Take 51 g by mouth daily. Take 3 capful in 8 oz of liquid daily until you have a large bowel movement.  Reduce back to 1 capful a day once you have a normal  stool. (hold for diarrhea) (Patient taking differently: Take 17 g by mouth daily. ) 14 each 1  . predniSONE (DELTASONE) 10 MG tablet Take 10 mg by mouth daily.    . sertraline (ZOLOFT) 25 MG tablet Take 25 mg by mouth daily.    . simethicone (GAS-X) 80 MG chewable tablet Chew 80 mg by mouth every 4 (four) hours as needed (for gas pain).     Marland Kitchen. sitaGLIPtin (JANUVIA) 50 MG tablet Take 25 mg by mouth daily.     Marland Kitchen. trolamine salicylate (ASPERCREME) 10 % cream Apply 1 application topically as needed for muscle pain.    Marland Kitchen. buPROPion (WELLBUTRIN SR) 200 MG 12 hr tablet Take 200 mg by  mouth 2 (two) times daily.      Marland Kitchen gabapentin (NEURONTIN) 300 MG capsule Take 300 mg by mouth at bedtime.     Marland Kitchen lisinopril (PRINIVIL,ZESTRIL) 10 MG tablet Take 10 mg by mouth daily.    . meloxicam (MOBIC) 7.5 MG tablet Take 7.5 mg by mouth daily.    . metoCLOPramide (REGLAN) 5 MG tablet     . nitrofurantoin (MACRODANTIN) 100 MG capsule     . loratadine (CLARITIN) 10 MG tablet Take 10 mg by mouth daily.       No facility-administered medications prior to visit.      Allergies:   Codeine sulfate and Prevacid [lansoprazole]   Social History   Socioeconomic History  . Marital status: Widowed    Spouse name: Not on file  . Number of children: Not on file  . Years of education: Not on file  . Highest education level: Not on file  Occupational History  . Not on file  Social Needs  . Financial resource strain: Not on file  . Food insecurity    Worry: Not on file    Inability: Not on file  . Transportation needs    Medical: Not on file    Non-medical: Not on file  Tobacco Use  . Smoking status: Never Smoker  . Smokeless tobacco: Never Used  Substance and Sexual Activity  . Alcohol use: No  . Drug use: Not on file  . Sexual activity: Not on file  Lifestyle  . Physical activity    Days per week: Not on file    Minutes per session: Not on file  . Stress: Not on file  Relationships  . Social Wellsite geologist on phone: Not on file    Gets together: Not on file    Attends religious service: Not on file    Active member of club or organization: Not on file    Attends meetings of clubs or organizations: Not on file    Relationship status: Not on file  Other Topics Concern  . Not on file  Social History Narrative  . Not on file     Family History:  The patient's family history includes Cancer in her father and sister; Diabetes in her mother; Heart attack in her mother; Hypertension in her mother.   ROS:   Please see the history of present illness.    ROS All other systems are reviewed and are negative.   PHYSICAL EXAM:   VS:  BP 140/63   Pulse 83   Temp (!) 96.7 F (35.9 C)   Ht  (1.6 m)   Wt 144 lb 6.4 oz (65.5 kg)   SpO2 97%   BMI 25.58 kg/m     General: Alert, oriented x3, no distress, appears quite elderly and frail.  Healthy left subclavian pacemaker site Head: no evidence of trauma, PERRL, EOMI, no exophtalmos or lid lag, no myxedema, no xanthelasma; normal ears, nose and oropharynx Neck: normal jugular venous pulsations and no hepatojugular reflux; brisk carotid pulses without delay and no carotid bruits Chest: clear to auscultation, no signs of consolidation by percussion or palpation, normal fremitus, symmetrical and full respiratory excursions Cardiovascular: normal position and quality of the apical impulse, regular rhythm, normal first and paradoxically split second heart sounds, no murmurs, rubs or gallops Abdomen: no tenderness or distention, no masses by palpation, no abnormal pulsatility or arterial bruits, normal bowel sounds, no hepatosplenomegaly Extremities: no clubbing, cyanosis or edema; 2+ radial, ulnar  and brachial pulses bilaterally; 2+ right femoral, posterior tibial and dorsalis pedis pulses; 2+ left femoral, posterior tibial and dorsalis pedis pulses; no subclavian or femoral bruits Neurological: grossly nonfocal Psych: Normal mood and affect    Wt Readings from Last 3 Encounters:  12/03/18 144 lb 6.4 oz (65.5 kg)  11/29/17 141 lb 6.4 oz (64.1 kg)  07/19/17 136 lb (61.7 kg)      Studies/Labs Reviewed:   EKG:  EKG is ordered today.  It shows atrial paced, ventricular paced rhythm  Recent Labs: 11/13/2018 hemoglobin 11.6, creatinine 0.71, potassium 3.6, normal liver function tests, hemoglobin A1c 5.9%  Lipid Panel 11/13/2018 total cholesterol 203, HDL 34, LDL 142, triglycerides 148 ASSESSMENT:    1. CHB (complete heart block) (HCC)   2. Pacemaker   3. Essential hypertension   4. Chronic diastolic heart failure ([place])   5. Coronary artery disease involving native coronary artery of native heart without angina pectoris   6. H/O carotid endarterectomy   7. Diabetes mellitus type 2 in nonobese (HCC)   8. Dementia without behavioral disturbance, unspecified dementia type ([place])      PLAN:  In order of problems listed above:  1. 3rd deg AVBlock: Pacemaker dependent.  No escape rhythm detected today. 2. PPM: Although the atrial lead parameters are far from ideal, we can still adjust device settings to allow for optimal performance.  She is not a good candidate for lead revision.  Downloads every 3 months and office visit yearly 3. HTN: As she has lost additional weight, she no longer has significant elevation in blood pressure.  I would except a systolic blood pressure in the 140s.  Would like to avoid adding back any medications. 4. CHF: It has been a long time since she had heart failure decompensation, on the contrary she has been more prone to hypovolemia.   5. CAD: Asymptomatic.  No plan for aggressive evaluation or treatment. 6. Carotid stenosis: Left carotid endarterectomy site patent by Duplex US 2013. No routine monitoring due to advanced age and memory problems. 7. DM: Most recent A1c 5.9%, without any medications, following weight loss 8. Dementia: The patient's daughter points out she would like to limit the number  of medications she receives to those that provide relief of symptoms and reaffirms her DNR status.    Medication Adjustments/Labs and Tests Ordered: Current medicines are reviewed at length with the patient today.  Concerns regarding medicines are outlined above.  Medication changes, Labs and Tests ordered today are listed in the Patient Instructions below. Patient Instructions  Medication Instructions:  No chnages *If you need a refill on your cardiac medications before your next appointment, please call your pharmacy*  Lab Work: None ordered If you have labs (blood work) drawn today and your tests are completely normal, you will receive your results only by: Marland Kitchen MyChart Message (if you have MyChart) OR . A paper copy in the mail If you have any lab test that is abnormal or we need to change your treatment, we will call you to review the results.  Testing/Procedures: None ordered  Follow-Up: At [facility], you and your health needs are our priority.  As part of our continuing mission to provide you with exceptional heart care, we have created designated Provider Care Teams.  These Care Teams include your primary Cardiologist (physician) and Advanced Practice Providers (APPs -  Physician Assistants and Nurse Practitioners) who all work together to provide you with the care you need, when you need it.  Your next appointment:   12 month(s)  The format for your next appointment:   In Person  Provider:   Thurmon Fair, MD    Signed, Thurmon Fair, MD  12/03/2018 3:42 PM    [facility] [address] [place], [place], [place]  [address] Phone: [phone]; Fax: [phone]

## 2019-02-27 ENCOUNTER — Ambulatory Visit (INDEPENDENT_AMBULATORY_CARE_PROVIDER_SITE_OTHER): Payer: Medicare Other | Admitting: *Deleted

## 2019-02-27 DIAGNOSIS — I442 Atrioventricular block, complete: Secondary | ICD-10-CM

## 2019-02-27 LAB — CUP PACEART REMOTE DEVICE CHECK
Battery Remaining Longevity: 70 mo
Battery Remaining Percentage: 95.5 %
Battery Voltage: 2.99 V
Brady Statistic AP VP Percent: 39 %
Brady Statistic AP VS Percent: 1 %
Brady Statistic AS VP Percent: 61 %
Brady Statistic AS VS Percent: 1 %
Brady Statistic RA Percent Paced: 39 %
Brady Statistic RV Percent Paced: 99 %
Date Time Interrogation Session: 20210224020013
Implantable Lead Implant Date: 20060511
Implantable Lead Implant Date: 20060511
Implantable Lead Location: 753859
Implantable Lead Location: 753860
Implantable Pulse Generator Implant Date: 20190717
Lead Channel Impedance Value: 410 Ohm
Lead Channel Impedance Value: 680 Ohm
Lead Channel Pacing Threshold Amplitude: 0.75 V
Lead Channel Pacing Threshold Amplitude: 1.5 V
Lead Channel Pacing Threshold Pulse Width: 0.5 ms
Lead Channel Pacing Threshold Pulse Width: 1 ms
Lead Channel Sensing Intrinsic Amplitude: 1.1 mV
Lead Channel Sensing Intrinsic Amplitude: 6.7 mV
Lead Channel Setting Pacing Amplitude: 1 V
Lead Channel Setting Pacing Amplitude: 3.75 V
Lead Channel Setting Pacing Pulse Width: 0.5 ms
Lead Channel Setting Sensing Sensitivity: 2 mV
Pulse Gen Model: 2272
Pulse Gen Serial Number: 9040451

## 2019-02-28 NOTE — Progress Notes (Signed)
PPM Remote  

## 2019-05-29 ENCOUNTER — Ambulatory Visit (INDEPENDENT_AMBULATORY_CARE_PROVIDER_SITE_OTHER): Payer: Medicare Other | Admitting: *Deleted

## 2019-05-29 DIAGNOSIS — I441 Atrioventricular block, second degree: Secondary | ICD-10-CM | POA: Diagnosis not present

## 2019-05-29 LAB — CUP PACEART REMOTE DEVICE CHECK
Battery Remaining Longevity: 73 mo
Battery Remaining Percentage: 95.5 %
Battery Voltage: 2.99 V
Brady Statistic AP VP Percent: 35 %
Brady Statistic AP VS Percent: 1 %
Brady Statistic AS VP Percent: 65 %
Brady Statistic AS VS Percent: 1 %
Brady Statistic RA Percent Paced: 35 %
Brady Statistic RV Percent Paced: 99 %
Date Time Interrogation Session: 20210526020012
Implantable Lead Implant Date: 20060511
Implantable Lead Implant Date: 20060511
Implantable Lead Location: 753859
Implantable Lead Location: 753860
Implantable Pulse Generator Implant Date: 20190717
Lead Channel Impedance Value: 430 Ohm
Lead Channel Impedance Value: 730 Ohm
Lead Channel Pacing Threshold Amplitude: 0.75 V
Lead Channel Pacing Threshold Amplitude: 1.5 V
Lead Channel Pacing Threshold Pulse Width: 0.5 ms
Lead Channel Pacing Threshold Pulse Width: 1 ms
Lead Channel Sensing Intrinsic Amplitude: 1.2 mV
Lead Channel Sensing Intrinsic Amplitude: 7.3 mV
Lead Channel Setting Pacing Amplitude: 1 V
Lead Channel Setting Pacing Amplitude: 3.75 V
Lead Channel Setting Pacing Pulse Width: 0.5 ms
Lead Channel Setting Sensing Sensitivity: 2 mV
Pulse Gen Model: 2272
Pulse Gen Serial Number: 9040451

## 2019-05-30 NOTE — Progress Notes (Signed)
Remote pacemaker transmission.   

## 2019-08-28 ENCOUNTER — Ambulatory Visit (INDEPENDENT_AMBULATORY_CARE_PROVIDER_SITE_OTHER): Payer: Medicare Other | Admitting: *Deleted

## 2019-08-28 DIAGNOSIS — I441 Atrioventricular block, second degree: Secondary | ICD-10-CM | POA: Diagnosis not present

## 2019-08-30 LAB — CUP PACEART REMOTE DEVICE CHECK
Battery Remaining Longevity: 74 mo
Battery Remaining Percentage: 95.5 %
Battery Voltage: 2.99 V
Brady Statistic AP VP Percent: 33 %
Brady Statistic AP VS Percent: 1 %
Brady Statistic AS VP Percent: 67 %
Brady Statistic AS VS Percent: 1 %
Brady Statistic RA Percent Paced: 33 %
Brady Statistic RV Percent Paced: 99 %
Date Time Interrogation Session: 20210825020014
Implantable Lead Implant Date: 20060511
Implantable Lead Implant Date: 20060511
Implantable Lead Location: 753859
Implantable Lead Location: 753860
Implantable Pulse Generator Implant Date: 20190717
Lead Channel Impedance Value: 440 Ohm
Lead Channel Impedance Value: 740 Ohm
Lead Channel Pacing Threshold Amplitude: 0.625 V
Lead Channel Pacing Threshold Amplitude: 1.25 V
Lead Channel Pacing Threshold Pulse Width: 0.5 ms
Lead Channel Pacing Threshold Pulse Width: 1 ms
Lead Channel Sensing Intrinsic Amplitude: 1.5 mV
Lead Channel Sensing Intrinsic Amplitude: 7.2 mV
Lead Channel Setting Pacing Amplitude: 0.875
Lead Channel Setting Pacing Amplitude: 3.75 V
Lead Channel Setting Pacing Pulse Width: 0.5 ms
Lead Channel Setting Sensing Sensitivity: 2 mV
Pulse Gen Model: 2272
Pulse Gen Serial Number: 9040451

## 2019-09-03 NOTE — Progress Notes (Signed)
Remote pacemaker transmission.   

## 2019-11-27 ENCOUNTER — Ambulatory Visit (INDEPENDENT_AMBULATORY_CARE_PROVIDER_SITE_OTHER): Payer: Medicare Other

## 2019-11-27 DIAGNOSIS — I442 Atrioventricular block, complete: Secondary | ICD-10-CM | POA: Diagnosis not present

## 2019-11-27 LAB — CUP PACEART REMOTE DEVICE CHECK
Battery Remaining Longevity: 73 mo
Battery Remaining Percentage: 95.5 %
Battery Voltage: 2.98 V
Brady Statistic AP VP Percent: 32 %
Brady Statistic AP VS Percent: 1 %
Brady Statistic AS VP Percent: 68 %
Brady Statistic AS VS Percent: 1 %
Brady Statistic RA Percent Paced: 32 %
Brady Statistic RV Percent Paced: 99 %
Date Time Interrogation Session: 20211124020014
Implantable Lead Implant Date: 20060511
Implantable Lead Implant Date: 20060511
Implantable Lead Location: 753859
Implantable Lead Location: 753860
Implantable Pulse Generator Implant Date: 20190717
Lead Channel Impedance Value: 430 Ohm
Lead Channel Impedance Value: 710 Ohm
Lead Channel Pacing Threshold Amplitude: 0.75 V
Lead Channel Pacing Threshold Amplitude: 1.875 V
Lead Channel Pacing Threshold Pulse Width: 0.5 ms
Lead Channel Pacing Threshold Pulse Width: 1 ms
Lead Channel Sensing Intrinsic Amplitude: 1.2 mV
Lead Channel Sensing Intrinsic Amplitude: 6.8 mV
Lead Channel Setting Pacing Amplitude: 1 V
Lead Channel Setting Pacing Amplitude: 3.75 V
Lead Channel Setting Pacing Pulse Width: 0.5 ms
Lead Channel Setting Sensing Sensitivity: 2 mV
Pulse Gen Model: 2272
Pulse Gen Serial Number: 9040451

## 2019-12-05 NOTE — Progress Notes (Signed)
Remote pacemaker transmission.   

## 2019-12-09 ENCOUNTER — Ambulatory Visit (INDEPENDENT_AMBULATORY_CARE_PROVIDER_SITE_OTHER): Payer: Medicare Other | Admitting: Cardiovascular Disease

## 2019-12-09 ENCOUNTER — Encounter: Payer: Self-pay | Admitting: Cardiovascular Disease

## 2019-12-09 ENCOUNTER — Other Ambulatory Visit: Payer: Self-pay

## 2019-12-09 VITALS — BP 176/64 | HR 60 | Ht 62.0 in | Wt 164.6 lb

## 2019-12-09 DIAGNOSIS — I10 Essential (primary) hypertension: Secondary | ICD-10-CM

## 2019-12-09 DIAGNOSIS — I442 Atrioventricular block, complete: Secondary | ICD-10-CM

## 2019-12-09 DIAGNOSIS — Z950 Presence of cardiac pacemaker: Secondary | ICD-10-CM | POA: Diagnosis not present

## 2019-12-09 DIAGNOSIS — I5032 Chronic diastolic (congestive) heart failure: Secondary | ICD-10-CM

## 2019-12-09 DIAGNOSIS — F0390 Unspecified dementia without behavioral disturbance: Secondary | ICD-10-CM

## 2019-12-09 DIAGNOSIS — Z9889 Other specified postprocedural states: Secondary | ICD-10-CM

## 2019-12-09 DIAGNOSIS — I2510 Atherosclerotic heart disease of native coronary artery without angina pectoris: Secondary | ICD-10-CM | POA: Diagnosis not present

## 2019-12-09 DIAGNOSIS — E119 Type 2 diabetes mellitus without complications: Secondary | ICD-10-CM

## 2019-12-09 LAB — PACEMAKER DEVICE OBSERVATION

## 2019-12-09 NOTE — Progress Notes (Signed)
Patient ID: [name], female   DOB: Oct 08, 1923, [age] y.o.   MRN: [id number]    Cardiology Office Note    Date:  12/10/2019   ID:  Leslie HutchingMildred J [name], DOB Oct 08, 1923, MRN [id number]  PCP:  Keane PoliceSlade-Hartman, Venezela, MD  Cardiologist:   Thurmon FairMihai Varun Jourdan, MD   Chief Complaint  Patient presents with  . Pacemaker Check    History of Present Illness:  [name] is a 86 y.o. 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  Her current device was a generator change out in 2019 ([employer]).  She has moderate dementia and lives [facility].  As always, her daughter accompanies her to today's appointment.  Mrs. [name] has a DNR status and the patient and family are clearly focused on symptom palliation, not on aggressive care.  She is done better since her last appointment, but is very sedentary.  She has been eating better than last year and has gained quite a bit of weight.  She spends 100% of the daytime hours in her wheelchair.  She can only transfer with assistance.  Simvastatin was prescribed while she was in the nursing home.  She apparently developed edema and shortness of breath and when this medication was discontinued, her symptoms improved.  Around the same time she was receiving steroids for joint problems and its more likely that these caused her worsening symptoms of volume retention.  Otherwise, she chest pain at rest or with exertion, dyspnea at rest or with exertion, orthopnea, paroxysmal nocturnal dyspnea, syncope, palpitations, focal neurological deficits, intermittent claudication, lower extremity edema, unexplained weight gain, cough, hemoptysis or wheezing.  Comprehensive device interrogation today shows normal function.  Presenting rhythm is AV sequentially paced.  Battery longevity suggest 6-8 years remaining.  She has 32% atrial pacing and 100% ventricular pacing.   Atrial pacing threshold parameters are actually better today than at her previous appointment.  She has not had any episodes of atrial mode switch or high ventricular rates.  Today there was an underlying escape rhythm at about 40 bpm.  This has not been present consistently.  She has previous undergone a left carotid endarterectomy and has hypertension and type 2 diabetes mellitus.     Past Medical History:  Diagnosis Date  . CAD (coronary artery disease)   . CHB (complete heart block) (HCC)    Permanent pacemaker- St.Jude  . CHF (congestive heart failure) (HCC)   . Coronary artery stenosis   . DDD (degenerative disc disease), lumbar   . Diabetes mellitus 2004   type 2  . Hyperlipidemia   . Hypertension   . MRSA infection 2007   X 2  . Nephrolithiasis   . OAB (overactive bladder)   . Osteoarthritis     Past Surgical History:  Procedure Laterality Date  . ABDOMINAL HYSTERECTOMY    . ANKLE SURGERY    . APPENDECTOMY    . BACK SURGERY     X 2  . CARDIAC CATHETERIZATION  09/01/2004   diffuse small vessel disease w/large vessel 70% mid LAD stenosis  . cataract surgery    . CEA  4-07  . CHOLECYSTECTOMY    . kidney stone removal    . Left hip replacement  6-07  . LUMBAR FUSION    . NM MYOCAR PERF WALL MOTION  08/18/2008   normal  . PACEMAKER INSERTION  05/13/2004   St.Jude  . PPM GENERATOR CHANGEOUT  N/A 07/19/2017   Procedure: PPM GENERATOR CHANGEOUT - dual chamber;  Surgeon: Thurmon Fair, MD;  Location: [facility]IVE [facility];  Service: Cardiovascular;  Laterality: N/A;  . SKIN CANCER EXCISION  03/2014   right chest area  . US ECHOCARDIOGRAPHY  08/06/2008   Grade I diastolic dysfunction,trace TR,ca+ MV    Outpatient Medications Prior to Visit  Medication Sig Dispense Refill  . acetaminophen (TYLENOL) 325 MG tablet Take 650 mg by mouth every 4 (four) hours as needed for moderate pain or headache.     . ALPRAZolam (XANAX) 0.25 MG tablet Take 0.25 mg by mouth daily as needed  for anxiety.    Marland Kitchen aspirin (ASPIRIN 81) 81 MG chewable tablet Chew 81 mg by mouth daily.    . cetirizine (ZYRTEC) 10 MG tablet Take by mouth.    . doxepin (SINEQUAN) 25 MG capsule Take 25 mg by mouth at bedtime.    Marland Kitchen esomeprazole (NEXIUM) 40 MG capsule Take 40 mg by mouth 2 (two) times daily.      Marland Kitchen estradiol (ESTRACE) 0.1 MG/GM vaginal cream Place 2 g vaginally 2 (two) times a week.     Marland Kitchen HYDROcodone-acetaminophen (NORCO/VICODIN) 5-325 MG tablet Take 1 tablet by mouth 2 (two) times daily.    Marland Kitchen ketoconazole (NIZORAL) 2 % cream     . meloxicam (MOBIC) 15 MG tablet Take 15 mg by mouth daily.    . Multiple Vitamin (MULTIVITAMIN) tablet Take 1 tablet by mouth daily.    . nitrofurantoin, macrocrystal-monohydrate, (MACROBID) 100 MG capsule Take 100 mg by mouth daily.    . nitroGLYCERIN (NITROSTAT) 0.4 MG SL tablet Place 0.4 mg under the tongue every 5 (five) minutes as needed for chest pain.     Marland Kitchen ondansetron (ZOFRAN) 4 MG tablet Take 4 mg by mouth every 8 (eight) hours as needed for nausea or vomiting.    . polyethylene glycol (MIRALAX / GLYCOLAX) packet Take 51 g by mouth daily. Take 3 capful in 8 oz of liquid daily until you have a large bowel movement.  Reduce back to 1 capful a day once you have a normal stool. (hold for diarrhea) (Patient taking differently: Take 17 g by mouth daily. ) 14 each 1  . predniSONE (DELTASONE) 10 MG tablet Take 10 mg by mouth daily.    . sertraline (ZOLOFT) 25 MG tablet Take 25 mg by mouth daily.    . simethicone (GAS-X) 80 MG chewable tablet Chew 80 mg by mouth every 4 (four) hours as needed (for gas pain).     Marland Kitchen sitaGLIPtin (JANUVIA) 50 MG tablet Take 25 mg by mouth daily.     Marland Kitchen trolamine salicylate (ASPERCREME) 10 % cream Apply 1 application topically as needed for muscle pain.     No facility-administered medications prior to visit.     Allergies:   Codeine sulfate and Prevacid [lansoprazole]   Social History   Socioeconomic History  . Marital status:  Widowed    Spouse name: Not on file  . Number of children: Not on file  . Years of education: Not on file  . Highest education level: Not on file  Occupational History  . Not on file  Tobacco Use  . Smoking status: Never Smoker  . Smokeless tobacco: Never Used  Substance and Sexual Activity  . Alcohol use: No  . Drug use: Not on file  . Sexual activity: Not on file  Other Topics Concern  . Not on file  Social History Narrative  . Not  on file   Social Determinants of Health   Financial Resource Strain:   . Difficulty of Paying Living Expenses: Not on file  Food Insecurity:   . Worried About Programme researcher, broadcasting/film/video in the Last Year: Not on file  . Ran Out of Food in the Last Year: Not on file  Transportation Needs:   . Lack of Transportation (Medical): Not on file  . Lack of Transportation (Non-Medical): Not on file  Physical Activity:   . Days of Exercise per Week: Not on file  . Minutes of Exercise per Session: Not on file  Stress:   . Feeling of Stress : Not on file  Social Connections:   . Frequency of Communication with Friends and Family: Not on file  . Frequency of Social Gatherings with Friends and Family: Not on file  . Attends Religious Services: Not on file  . Active Member of Clubs or Organizations: Not on file  . Attends Banker Meetings: Not on file  . Marital Status: Not on file     Family History:  The patient's family history includes Cancer in her father and sister; Diabetes in her mother; Heart attack in her mother; Hypertension in her mother.   ROS:   Please see the history of present illness.    ROS All other systems are reviewed and are negative.   PHYSICAL EXAM:   VS:  BP (!) 176/64   Pulse 60   Ht 5\' 2"  (1.575 m)   Wt 164 lb 9.6 oz (74.7 kg)   SpO2 99%   BMI 30.11 kg/m      General: Alert, oriented x3, no distress, appears elderly and frail, but comfortable and happy Head: no evidence of trauma, PERRL, EOMI, no exophtalmos  or lid lag, no myxedema, no xanthelasma; normal ears, nose and oropharynx Neck: normal jugular venous pulsations and no hepatojugular reflux; brisk carotid pulses without delay and no carotid bruits Chest: clear to auscultation, no signs of consolidation by percussion or palpation, normal fremitus, symmetrical and full respiratory excursions Cardiovascular: normal position and quality of the apical impulse, regular rhythm, normal first and second heart sounds, no murmurs, rubs or gallops Abdomen: no tenderness or distention, no masses by palpation, no abnormal pulsatility or arterial bruits, normal bowel sounds, no hepatosplenomegaly Extremities: no clubbing, cyanosis or edema; 2+ radial, ulnar and brachial pulses bilaterally; 2+ right femoral, posterior tibial and dorsalis pedis pulses; 2+ left femoral, posterior tibial and dorsalis pedis pulses; no subclavian or femoral bruits Neurological: grossly nonfocal except hard of hearing Psych: Normal mood and affect     Wt Readings from Last 3 Encounters:  12/09/19 164 lb 9.6 oz (74.7 kg)  12/03/18 144 lb 6.4 oz (65.5 kg)  11/29/17 141 lb 6.4 oz (64.1 kg)      Studies/Labs Reviewed:   EKG:  EKG is ordered today.  Shows atrial paced, ventricular paced rhythm, QTC 456 ms  Recent Labs: 11/13/2018 hemoglobin 11.6, creatinine 0.71, potassium 3.6, normal liver function tests, hemoglobin A1c 5.9% 11/25/2019 BUN 27, creatinine 1.19(GFR estimated 39), potassium 4.1, hemoglobin 10.2, hemoglobin A1c 6.3% Lipid Panel 11/13/2018 total cholesterol 203, HDL 34, LDL 142, triglycerides 148  11/25/2019 Total cholesterol 190, HDL 43, LDL 118, triglycerides 177 ASSESSMENT:    1. CHB (complete heart block) (HCC)   2. Pacemaker   3. Essential hypertension   4. Chronic diastolic heart failure (HCC)   5. Coronary artery disease involving native coronary artery of native heart without angina pectoris  6. H/O carotid endarterectomy   7. Diabetes mellitus  type 2 in nonobese (HCC)   8. Dementia without behavioral disturbance, unspecified dementia type (HCC)      PLAN:  In order of problems listed above:  1. 3rd deg AVBlock: She does have an escape rhythm today, but this is not consistently present and she should be considered pacemaker dependent. 2. PPM: Atrial lead pacing thresholds are little high, but have actually improved since her last appointment and she does not require lead revision.  Continue downloads every 3 months and yearly office visits. 3. HTN: Her systolic blood pressure was a little high today, but just a few days ago her blood pressure was recorded at 140/63.  No changes were made to her medications 4. CHF: She appears to be clinically euvolemic at this time.  May have had transient decompensation during treatment with steroids. 5. CAD: Does not have angina pectoris and we do not plan any aggressive interventions. 6. Carotid stenosis: No recent neurological complaints to suggest focal events.  She does have progressive dementia.  Left carotid endarterectomy site patent by Duplex US 2013. No routine monitoring due to advanced age and memory problems. 7. DM: Well-controlled hemoglobin A1c at 6.3%. 8. Dementia: The patient's daughter has previously told us that she would like to limit the number of medications she receives to those that provide relief of symptoms and reaffirms her DNR status.  Despite her history of vascular disease and diabetes, due to the perceived side effects from statin therapy, would not restart these.    Medication Adjustments/Labs and Tests Ordered: Current medicines are reviewed at length with the patient today.  Concerns regarding medicines are outlined above.  Medication changes, Labs and Tests ordered today are listed in the Patient Instructions below. Patient Instructions  Medication Instructions:  No changes *If you need a refill on your cardiac medications before your next appointment, please call  your pharmacy*   Lab Work: None ordered If you have labs (blood work) drawn today and your tests are completely normal, you will receive your results only by: Marland Kitchen MyChart Message (if you have MyChart) OR . A paper copy in the mail If you have any lab test that is abnormal or we need to change your treatment, we will call you to review the results.   Testing/Procedures: None ordered   Follow-Up: At [facility], you and your health needs are our priority.  As part of our continuing mission to provide you with exceptional heart care, we have created designated Provider Care Teams.  These Care Teams include your primary Cardiologist (physician) and Advanced Practice Providers (APPs -  Physician Assistants and Nurse Practitioners) who all work together to provide you with the care you need, when you need it.  We recommend signing up for the patient portal called "MyChart".  Sign up information is provided on this After Visit Summary.  MyChart is used to connect with patients for Virtual Visits (Telemedicine).  Patients are able to view lab/test results, encounter notes, upcoming appointments, etc.  Non-urgent messages can be sent to your provider as well.   To learn more about what you can do with MyChart, go to [email or web address].    Your next appointment:   12 month(s)  The format for your next appointment:   In Person  Provider:   Thurmon Fair, MD    Signed, Thurmon Fair, MD  12/10/2019 5:21 PM    [facility] [address] [place], [place], [place]  [address] Phone: [phone]; Fax: [phone]

## 2019-12-09 NOTE — Patient Instructions (Signed)

## 2019-12-10 ENCOUNTER — Encounter: Payer: Self-pay | Admitting: Cardiovascular Disease

## 2020-02-26 ENCOUNTER — Ambulatory Visit (INDEPENDENT_AMBULATORY_CARE_PROVIDER_SITE_OTHER): Payer: Medicare Other

## 2020-02-26 DIAGNOSIS — I442 Atrioventricular block, complete: Secondary | ICD-10-CM

## 2020-02-27 LAB — CUP PACEART REMOTE DEVICE CHECK
Battery Remaining Longevity: 94 mo
Battery Remaining Percentage: 95.5 %
Battery Voltage: 2.99 V
Brady Statistic AP VP Percent: 3.1 %
Brady Statistic AP VS Percent: 1 %
Brady Statistic AS VP Percent: 97 %
Brady Statistic AS VS Percent: 1 %
Brady Statistic RA Percent Paced: 3.1 %
Brady Statistic RV Percent Paced: 99 %
Date Time Interrogation Session: 20220223020015
Implantable Lead Implant Date: 20060511
Implantable Lead Implant Date: 20060511
Implantable Lead Location: 753859
Implantable Lead Location: 753860
Implantable Pulse Generator Implant Date: 20190717
Lead Channel Impedance Value: 410 Ohm
Lead Channel Impedance Value: 700 Ohm
Lead Channel Pacing Threshold Amplitude: 0.75 V
Lead Channel Pacing Threshold Amplitude: 1.5 V
Lead Channel Pacing Threshold Pulse Width: 0.5 ms
Lead Channel Pacing Threshold Pulse Width: 1 ms
Lead Channel Sensing Intrinsic Amplitude: 1.3 mV
Lead Channel Sensing Intrinsic Amplitude: 6.1 mV
Lead Channel Setting Pacing Amplitude: 1 V
Lead Channel Setting Pacing Amplitude: 3.75 V
Lead Channel Setting Pacing Pulse Width: 0.5 ms
Lead Channel Setting Sensing Sensitivity: 2 mV
Pulse Gen Model: 2272
Pulse Gen Serial Number: 9040451

## 2020-03-06 NOTE — Progress Notes (Signed)
Remote pacemaker transmission.   

## 2020-05-27 ENCOUNTER — Ambulatory Visit (INDEPENDENT_AMBULATORY_CARE_PROVIDER_SITE_OTHER): Payer: Medicare Other

## 2020-05-27 DIAGNOSIS — I442 Atrioventricular block, complete: Secondary | ICD-10-CM

## 2020-05-28 LAB — CUP PACEART REMOTE DEVICE CHECK
Battery Remaining Longevity: 95 mo
Battery Remaining Percentage: 95.5 %
Battery Voltage: 2.99 V
Brady Statistic AP VP Percent: 2.7 %
Brady Statistic AP VS Percent: 1 %
Brady Statistic AS VP Percent: 97 %
Brady Statistic AS VS Percent: 1 %
Brady Statistic RA Percent Paced: 2.7 %
Brady Statistic RV Percent Paced: 99 %
Date Time Interrogation Session: 20220525020013
Implantable Lead Implant Date: 20060511
Implantable Lead Implant Date: 20060511
Implantable Lead Location: 753859
Implantable Lead Location: 753860
Implantable Pulse Generator Implant Date: 20190717
Lead Channel Impedance Value: 410 Ohm
Lead Channel Impedance Value: 690 Ohm
Lead Channel Pacing Threshold Amplitude: 0.75 V
Lead Channel Pacing Threshold Amplitude: 1.5 V
Lead Channel Pacing Threshold Pulse Width: 0.5 ms
Lead Channel Pacing Threshold Pulse Width: 1 ms
Lead Channel Sensing Intrinsic Amplitude: 1.1 mV
Lead Channel Sensing Intrinsic Amplitude: 6.1 mV
Lead Channel Setting Pacing Amplitude: 1 V
Lead Channel Setting Pacing Amplitude: 3.75 V
Lead Channel Setting Pacing Pulse Width: 0.5 ms
Lead Channel Setting Sensing Sensitivity: 2 mV
Pulse Gen Model: 2272
Pulse Gen Serial Number: 9040451

## 2020-06-22 NOTE — Progress Notes (Signed)
Remote pacemaker transmission.   

## 2020-08-26 ENCOUNTER — Ambulatory Visit (INDEPENDENT_AMBULATORY_CARE_PROVIDER_SITE_OTHER): Payer: Medicare Other

## 2020-08-26 DIAGNOSIS — I442 Atrioventricular block, complete: Secondary | ICD-10-CM

## 2020-08-26 LAB — CUP PACEART REMOTE DEVICE CHECK
Battery Remaining Longevity: 62 mo
Battery Remaining Percentage: 67 %
Battery Voltage: 2.99 V
Brady Statistic AP VP Percent: 2 %
Brady Statistic AP VS Percent: 1 %
Brady Statistic AS VP Percent: 98 %
Brady Statistic AS VS Percent: 1 %
Brady Statistic RA Percent Paced: 2 %
Brady Statistic RV Percent Paced: 99 %
Date Time Interrogation Session: 20220824020015
Implantable Lead Implant Date: 20060511
Implantable Lead Implant Date: 20060511
Implantable Lead Location: 753859
Implantable Lead Location: 753860
Implantable Pulse Generator Implant Date: 20190717
Lead Channel Impedance Value: 410 Ohm
Lead Channel Impedance Value: 650 Ohm
Lead Channel Pacing Threshold Amplitude: 0.875 V
Lead Channel Pacing Threshold Amplitude: 1.5 V
Lead Channel Pacing Threshold Pulse Width: 0.5 ms
Lead Channel Pacing Threshold Pulse Width: 1 ms
Lead Channel Sensing Intrinsic Amplitude: 1.3 mV
Lead Channel Sensing Intrinsic Amplitude: 6.8 mV
Lead Channel Setting Pacing Amplitude: 1.125
Lead Channel Setting Pacing Amplitude: 3.75 V
Lead Channel Setting Pacing Pulse Width: 0.5 ms
Lead Channel Setting Sensing Sensitivity: 2 mV
Pulse Gen Model: 2272
Pulse Gen Serial Number: 9040451

## 2020-09-10 NOTE — Progress Notes (Signed)
Remote pacemaker transmission.   

## 2020-11-25 ENCOUNTER — Ambulatory Visit (INDEPENDENT_AMBULATORY_CARE_PROVIDER_SITE_OTHER): Payer: Medicare Other

## 2020-11-25 DIAGNOSIS — I442 Atrioventricular block, complete: Secondary | ICD-10-CM

## 2020-11-25 LAB — CUP PACEART REMOTE DEVICE CHECK
Battery Remaining Longevity: 61 mo
Battery Remaining Percentage: 65 %
Battery Voltage: 2.99 V
Brady Statistic AP VP Percent: 1.5 %
Brady Statistic AP VS Percent: 1 %
Brady Statistic AS VP Percent: 98 %
Brady Statistic AS VS Percent: 1 %
Brady Statistic RA Percent Paced: 1.5 %
Brady Statistic RV Percent Paced: 99 %
Date Time Interrogation Session: 20221123020013
Implantable Lead Implant Date: 20060511
Implantable Lead Implant Date: 20060511
Implantable Lead Location: 753859
Implantable Lead Location: 753860
Implantable Pulse Generator Implant Date: 20190717
Lead Channel Impedance Value: 410 Ohm
Lead Channel Impedance Value: 730 Ohm
Lead Channel Pacing Threshold Amplitude: 0.75 V
Lead Channel Pacing Threshold Amplitude: 1.5 V
Lead Channel Pacing Threshold Pulse Width: 0.5 ms
Lead Channel Pacing Threshold Pulse Width: 1 ms
Lead Channel Sensing Intrinsic Amplitude: 2 mV
Lead Channel Sensing Intrinsic Amplitude: 6.8 mV
Lead Channel Setting Pacing Amplitude: 1 V
Lead Channel Setting Pacing Amplitude: 3.75 V
Lead Channel Setting Pacing Pulse Width: 0.5 ms
Lead Channel Setting Sensing Sensitivity: 2 mV
Pulse Gen Model: 2272
Pulse Gen Serial Number: 9040451

## 2020-12-07 NOTE — Progress Notes (Signed)
Remote pacemaker transmission.   

## 2020-12-14 ENCOUNTER — Ambulatory Visit (INDEPENDENT_AMBULATORY_CARE_PROVIDER_SITE_OTHER): Payer: Medicare Other | Admitting: Cardiovascular Disease

## 2020-12-14 ENCOUNTER — Other Ambulatory Visit: Payer: Self-pay

## 2020-12-14 ENCOUNTER — Encounter: Payer: Self-pay | Admitting: Cardiovascular Disease

## 2020-12-14 VITALS — BP 199/84 | HR 77 | Ht 62.0 in | Wt 177.8 lb

## 2020-12-14 DIAGNOSIS — I442 Atrioventricular block, complete: Secondary | ICD-10-CM

## 2020-12-14 DIAGNOSIS — I10 Essential (primary) hypertension: Secondary | ICD-10-CM

## 2020-12-14 DIAGNOSIS — I2510 Atherosclerotic heart disease of native coronary artery without angina pectoris: Secondary | ICD-10-CM

## 2020-12-14 DIAGNOSIS — I5032 Chronic diastolic (congestive) heart failure: Secondary | ICD-10-CM

## 2020-12-14 DIAGNOSIS — Z9889 Other specified postprocedural states: Secondary | ICD-10-CM

## 2020-12-14 DIAGNOSIS — E119 Type 2 diabetes mellitus without complications: Secondary | ICD-10-CM

## 2020-12-14 DIAGNOSIS — Z950 Presence of cardiac pacemaker: Secondary | ICD-10-CM | POA: Diagnosis not present

## 2020-12-14 DIAGNOSIS — F03B0 Unspecified dementia, moderate, without behavioral disturbance, psychotic disturbance, mood disturbance, and anxiety: Secondary | ICD-10-CM

## 2020-12-14 MED ORDER — POTASSIUM CHLORIDE CRYS ER 10 MEQ PO TBCR: 10.0000 meq | EXTENDED_RELEASE_TABLET | Freq: Every day | ORAL | 2 refills | Status: DC

## 2020-12-14 MED ORDER — FUROSEMIDE 20 MG PO TABS: 20.0000 mg | ORAL_TABLET | Freq: Every day | ORAL | 3 refills | Status: AC

## 2020-12-14 NOTE — Patient Instructions (Signed)
Medication Instructions:  STOP the Meloxicam  START Furosemide 20 mg once daily START Potassium 10 mEq once daily  *If you need a refill on your cardiac medications before your next appointment, please call your pharmacy*   Lab Work: None ordered If you have labs (blood work) drawn today and your tests are completely normal, you will receive your results only by: MyChart Message (if you have MyChart) OR A paper copy in the mail If you have any lab test that is abnormal or we need to change your treatment, we will call you to review the results.   Testing/Procedures: None ordered   Follow-Up: At [facility], you and your health needs are our priority.  As part of our continuing mission to provide you with exceptional heart care, we have created designated Provider Care Teams.  These Care Teams include your primary Cardiologist (physician) and Advanced Practice Providers (APPs -  Physician Assistants and Nurse Practitioners) who all work together to provide you with the care you need, when you need it.  We recommend signing up for the patient portal called "MyChart".  Sign up information is provided on this After Visit Summary.  MyChart is used to connect with patients for Virtual Visits (Telemedicine).  Patients are able to view lab/test results, encounter notes, upcoming appointments, etc.  Non-urgent messages can be sent to your provider as well.   To learn more about what you can do with MyChart, go to [email or web address].    Your next appointment:   3 month(s) on a device day  The format for your next appointment:   In Person  Provider:   Thurmon Fair, MD     Other Instructions Your physician recommends that you weigh yourself everyday at the same time, on the same scale and with the same amount of clothing. Please keep a record of these weights and bring to your next appointment.

## 2020-12-14 NOTE — Progress Notes (Signed)
Patient ID: [name], female   DOB: 1923/04/24, 87 y.o.   MRN: [id number]    Cardiology Office Note    Date:  12/15/2020   ID:  [name], DOB 1923-10-25, MRN [id number]  PCP:  Keane Police, MD  Cardiologist:   Thurmon Fair, MD   No chief complaint on file.   History of Present Illness:  [name] is a 87 y.o. 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  Her current device was a generator change out in 2019 ([employer] Jude [employer]).  She has moderate dementia and lives at [facility]  As always, her daughter accompanies her to today's appointment.  Mrs. Sigley has a DNR status and the patient and family are clearly focused on symptom palliation, not on aggressive care.  She is recently developed worsening edema, over the last 6 months or so.  She has been taking meloxicam 15 mg daily for a variety of joint complaints.  She is also been on low-dose prednisone 2.5 mg once daily (I think this may be for polymyalgia rheumatica).  Her blood pressure has been quite high recently.  Her furosemide has also been on hold temporarily because she developed a sacral decubitus.  This has now healed.  We are not sure that she is on a low-sodium diet at [facility].  She denies any subjective complaints of shortness of breath, but her daughter believes that she breathes rather fast with light activity.  She remains very sedentary.  She does not have chest pain at rest or with activity.  She has not experienced dizziness, palpitations, syncope or falls.  She has not had any overt focal neurological deficits.  Pacemaker interrogation shows normal device function.  She has normal sinus rhythm with only 1.5% atrial pacing and 100% ventricular paced rhythm.  Battery longevity is estimated at 3-7 years.  Lead parameters are excellent.  She has had only 4 episodes of mode switch,  all of them very brief episodes of paroxysmal atrial tachycardia lasting a maximum of 12 seconds.  No escape rhythm is present today.   She has previous undergone a left carotid endarterectomy and has hypertension and type 2 diabetes mellitus.     Past Medical History:  Diagnosis Date   CAD (coronary artery disease)    CHB (complete heart block) (HCC)    Permanent pacemaker- St.Jude   CHF (congestive heart failure) (HCC)    Coronary artery stenosis    DDD (degenerative disc disease), lumbar    Diabetes mellitus 2004   type 2   Hyperlipidemia    Hypertension    MRSA infection 2007   X 2   Nephrolithiasis    OAB (overactive bladder)    Osteoarthritis     Past Surgical History:  Procedure Laterality Date   ABDOMINAL HYSTERECTOMY     ANKLE SURGERY     APPENDECTOMY     BACK SURGERY     X 2   CARDIAC CATHETERIZATION  09/01/2004   diffuse small vessel disease w/large vessel 70% mid LAD stenosis   cataract surgery     CEA  4-07   CHOLECYSTECTOMY     kidney stone removal     Left hip replacement  6-07   LUMBAR FUSION     NM MYOCAR PERF WALL MOTION  08/18/2008   normal   PACEMAKER INSERTION  05/13/2004   St.Jude   PPM GENERATOR CHANGEOUT N/A 07/19/2017  Procedure: PPM GENERATOR CHANGEOUT - dual chamber;  Surgeon: Thurmon Fair, MD;  Location: [facility] INVASIVE CV [facility];  Service: Cardiovascular;  Laterality: N/A;   SKIN CANCER EXCISION  03/2014   right chest area   US ECHOCARDIOGRAPHY  08/06/2008   Grade I diastolic dysfunction,trace TR,ca+ MV    Outpatient Medications Prior to Visit  Medication Sig Dispense Refill   acetaminophen (TYLENOL) 325 MG tablet Take 650 mg by mouth every 4 (four) hours as needed for moderate pain or headache.      ALPRAZolam (XANAX) 0.25 MG tablet Take 0.25 mg by mouth daily as needed for anxiety.     aspirin 81 MG chewable tablet Chew 81 mg by mouth daily.     cetirizine (ZYRTEC) 10 MG tablet Take 10 mg by mouth daily.     doxepin (SINEQUAN) 25 MG  capsule Take 25 mg by mouth at bedtime.     esomeprazole (NEXIUM) 40 MG capsule Take 40 mg by mouth 2 (two) times daily.       estradiol (ESTRACE) 0.1 MG/GM vaginal cream Place 2 g vaginally 2 (two) times a week.      HYDROcodone-acetaminophen (NORCO/VICODIN) 5-325 MG tablet Take 1 tablet by mouth 2 (two) times daily.     Multiple Vitamin (MULTIVITAMIN) tablet Take 1 tablet by mouth daily.     nitrofurantoin, macrocrystal-monohydrate, (MACROBID) 100 MG capsule Take 100 mg by mouth daily.     nitroGLYCERIN (NITROSTAT) 0.4 MG SL tablet Place 0.4 mg under the tongue every 5 (five) minutes as needed for chest pain.      ondansetron (ZOFRAN) 4 MG tablet Take 4 mg by mouth every 8 (eight) hours as needed for nausea or vomiting.     polyethylene glycol (MIRALAX / GLYCOLAX) packet Take 51 g by mouth daily. Take 3 capful in 8 oz of liquid daily until you have a large bowel movement.  Reduce back to 1 capful a day once you have a normal stool. (hold for diarrhea) (Patient taking differently: Take 17 g by mouth daily.) 14 each 1   predniSONE (DELTASONE) 2.5 MG tablet Take 2.5 mg by mouth daily.     sertraline (ZOLOFT) 50 MG tablet Take 50 mg by mouth daily.     simvastatin (ZOCOR) 20 MG tablet Take 20 mg by mouth at bedtime.     sitaGLIPtin (JANUVIA) 50 MG tablet Take 25 mg by mouth daily.      trolamine salicylate (ASPERCREME) 10 % cream Apply 1 application topically as needed for muscle pain.     meloxicam (MOBIC) 15 MG tablet Take 15 mg by mouth daily.     JANUVIA 25 MG tablet Take 25 mg by mouth daily.     ketoconazole (NIZORAL) 2 % cream  (Patient not taking: Reported on 12/14/2020)     predniSONE (DELTASONE) 10 MG tablet Take 10 mg by mouth daily. (Patient not taking: Reported on 12/14/2020)     sertraline (ZOLOFT) 25 MG tablet Take 25 mg by mouth daily. (Patient not taking: Reported on 12/14/2020)     simethicone (MYLICON) 80 MG chewable tablet Chew 80 mg by mouth every 4 (four) hours as needed (for  gas pain).  (Patient not taking: Reported on 12/14/2020)     No facility-administered medications prior to visit.     Allergies:   Codeine sulfate and Prevacid [lansoprazole]   Social History   Socioeconomic History   Marital status: Widowed    Spouse name: Not on file   Number of children: Not on  file   Years of education: Not on file   Highest education level: Not on file  Occupational History   Not on file  Tobacco Use   Smoking status: Never   Smokeless tobacco: Never  Substance and Sexual Activity   Alcohol use: No   Drug use: Not on file   Sexual activity: Not on file  Other Topics Concern   Not on file  Social History Narrative   Not on file   Social Determinants of Health   Financial Resource Strain: Not on file  Food Insecurity: Not on file  Transportation Needs: Not on file  Physical Activity: Not on file  Stress: Not on file  Social Connections: Not on file     Family History:  The patient's family history includes Cancer in her father and sister; Diabetes in her mother; Heart attack in her mother; Hypertension in her mother.   ROS:   Please see the history of present illness.    ROS All other systems are reviewed and are negative.   PHYSICAL EXAM:   VS:  BP (!) 199/84 (BP Location: Left Arm, Patient Position: Sitting, Cuff Size: Large)   Pulse 77   Ht 5\' 2"  (1.575 m)   Wt 177 lb 12.8 oz (80.6 kg) Comment: Last weighted in facility approx 12/03/2020  SpO2 98%   BMI 32.52 kg/m       General: Alert, oriented x3, no distress, healthy subclavian pacemaker site Head: no evidence of trauma, PERRL, EOMI, no exophtalmos or lid lag, no myxedema, no xanthelasma; normal ears, nose and oropharynx Neck: normal jugular venous pulsations and no hepatojugular reflux; brisk carotid pulses without delay and no carotid bruits Chest: clear to auscultation, no signs of consolidation by percussion or palpation, normal fremitus, symmetrical and full respiratory  excursions Cardiovascular: normal position and quality of the apical impulse, regular rhythm, normal first and paradoxically split second heart sounds, no murmurs, rubs or gallops Abdomen: no tenderness or distention, no masses by palpation, no abnormal pulsatility or arterial bruits, normal bowel sounds, no hepatosplenomegaly Extremities: no clubbing, cyanosis; she has symmetrical 3+ hard pitting edema to the knees; 2+ radial, ulnar and brachial pulses bilaterally; 2+ right femoral, posterior tibial and dorsalis pedis pulses; 2+ left femoral, posterior tibial and dorsalis pedis pulses; no subclavian or femoral bruits Neurological: grossly nonfocal Psych: Normal mood and affect      Wt Readings from Last 3 Encounters:  12/14/20 177 lb 12.8 oz (80.6 kg)  12/09/19 164 lb 9.6 oz (74.7 kg)  12/03/18 144 lb 6.4 oz (65.5 kg)      Studies/Labs Reviewed:   EKG:  EKG is ordered today.  Personally reviewed shows atrial sensed, ventricular paced rhythm.  The QRS is brought out 166 ms QTC 504 ms  Recent Labs: 11/13/2018 hemoglobin 11.6, creatinine 0.71, potassium 3.6, normal liver function tests, hemoglobin A1c 5.9% 11/25/2019 BUN 27, creatinine 1.19(GFR estimated 39), potassium 4.1, hemoglobin 10.2, hemoglobin A1c 6.3% 04/05/2020 BUN 25, creatinine 1.25, potassium 3.9, hemoglobin 10.1 with normocytic indices, mild leukopenia, normal platelet count Lipid Panel 11/13/2018 total cholesterol 203, HDL 34, LDL 142, triglycerides 148  11/25/2019 Total cholesterol 190, HDL 43, LDL 118, triglycerides 177 ASSESSMENT:    1. CHB (complete heart block) (HCC)   2. Pacemaker   3. Essential hypertension   4. Chronic diastolic heart failure (HCC)   5. Coronary artery disease involving native coronary artery of native heart without angina pectoris   6. H/O carotid endarterectomy   7. Diabetes mellitus  type 2 in nonobese (HCC)   8. Moderate dementia without behavioral disturbance, psychotic disturbance,  mood disturbance, or anxiety, unspecified dementia type      PLAN:  In order of problems listed above:  3rd deg AVBlock: Occasionally she has an idioventricular escape rhythm but this was not present today.  She should be considered pacemaker dependent. PPM: Although she has a little bit of elevation of the atrial pacing threshold, she almost never requires atrial pacing and this will not have significant impact on battery longevity. HTN: Marked increase in her blood pressure, likely due to the fact that she is clearly hypervolemic and is taking NSAIDs daily.  Stop the meloxicam. CHF: Clearly hypervolemic.  She has had previous decompensation when receiving steroids, this time the NSAIDs appear to be the culprit.  We need to make sure that she is on a sodium restricted diet.  Stop the meloxicam.  We will ask the facility to check her weights and blood pressure more frequently.  Restart furosemide and potassium supplements. CAD: She does not have any angina pectoris.  We do not plan to perform invasive evaluation or interventions.  Not on statin due to advanced age and dementia, to avoid polypharmacy. Carotid stenosis: Remote left carotid endarterectomy without subsequent focal neurological events.  She has progressive dementia and we have decided not to perform routine monitoring of the carotids. DM: Do not have any recent hemoglobin A1c or fasting glucose levels.  She is currently not receiving any medication for her diabetes other than Januvia. Dementia: The patient is daughter has previously told us that she wants to focus on treatments that provide relief of symptoms, I think the furosemide will help with that.  Reaffirmed DNR status today.    Medication Adjustments/Labs and Tests Ordered: Current medicines are reviewed at length with the patient today.  Concerns regarding medicines are outlined above.  Medication changes, Labs and Tests ordered today are listed in the Patient Instructions  below. Patient Instructions  Medication Instructions:  STOP the Meloxicam  START Furosemide 20 mg once daily START Potassium 10 mEq once daily  *If you need a refill on your cardiac medications before your next appointment, please call your pharmacy*   Lab Work: None ordered If you have labs (blood work) drawn today and your tests are completely normal, you will receive your results only by: MyChart Message (if you have MyChart) OR A paper copy in the mail If you have any lab test that is abnormal or we need to change your treatment, we will call you to review the results.   Testing/Procedures: None ordered   Follow-Up: At [facility], you and your health needs are our priority.  As part of our continuing mission to provide you with exceptional heart care, we have created designated Provider Care Teams.  These Care Teams include your primary Cardiologist (physician) and Advanced Practice Providers (APPs -  Physician Assistants and Nurse Practitioners) who all work together to provide you with the care you need, when you need it.  We recommend signing up for the patient portal called "MyChart".  Sign up information is provided on this After Visit Summary.  MyChart is used to connect with patients for Virtual Visits (Telemedicine).  Patients are able to view lab/test results, encounter notes, upcoming appointments, etc.  Non-urgent messages can be sent to your provider as well.   To learn more about what you can do with MyChart, go to [email or web address].    Your next appointment:   3  month(s) on a device day  The format for your next appointment:   In Person  Provider:   Thurmon Fair, MD     Other Instructions Your physician recommends that you weigh yourself everyday at the same time, on the same scale and with the same amount of clothing. Please keep a record of these weights and bring to your next appointment.   Signed, Thurmon Fair, MD  12/15/2020 5:08 PM     [facility] [address] [place], [place], [place]  [address] Phone: [phone]; Fax: [phone]

## 2020-12-15 ENCOUNTER — Telehealth: Payer: Self-pay | Admitting: Cardiovascular Disease

## 2020-12-15 MED ORDER — POTASSIUM CHLORIDE 20 MEQ/15ML (10%) PO SOLN: 10.0000 meq | Freq: Every day | ORAL | 2 refills | Status: AC

## 2020-12-15 NOTE — Telephone Encounter (Signed)
Pt c/o medication issue:  1. Name of Medication:  potassium chloride (KLOR-CON M) 10 MEQ tablet doxepin (SINEQUAN) 25 MG capsule  2. How are you currently taking this medication (dosage and times per day)? N/A  3. Are you having a reaction (difficulty breathing--STAT)? No   4. What is your medication issue? Missy and RN with [facility] is calling in regards to her system notifying her these medications being taken together can cause a reaction. Due to this she is required to call and notify the office and confirm how Dr. Royann Shivers would like to proceed. Potassium was prescribed yesterday at patient's appt.

## 2020-12-15 NOTE — Telephone Encounter (Signed)
Rx sent to pharmacy   

## 2020-12-15 NOTE — Telephone Encounter (Signed)
Routed to Dr. Salena Saner to advise on change in formulation of potassium

## 2020-12-15 NOTE — Telephone Encounter (Signed)
Routed to pharmacy for assistance with possible med interaction

## 2020-12-15 NOTE — Telephone Encounter (Signed)
Croitoru, Rachelle Hora, MD  You; Sandi Mariscal, RN 49 minutes ago (3:51 PM)   Yes okay to change to liquid formulation.

## 2020-12-15 NOTE — Telephone Encounter (Signed)
Potassium tablets should not be used with doxepin due to its anticholinergic effects - can increase risk of ulcers. Can change potassium to oral liquid formulation instead to avoid interaction.

## 2021-02-04 ENCOUNTER — Telehealth: Payer: Self-pay | Admitting: Cardiovascular Disease

## 2021-02-04 NOTE — Telephone Encounter (Signed)
Patient's daughter states the patient has been placed into hospice care. She says the nurse advised to cancel the patient's appointments but wanted to inform the office as well. She says she will also clarify if the patient still needs her transmission appointments and call back if they need to be cancelled.

## 2021-02-04 NOTE — Telephone Encounter (Signed)
Appropriate to DC pacemaker downloads if hospice requests that

## 2021-02-04 NOTE — Telephone Encounter (Signed)
It may be appropriate to DC the pacer checks, per family request. She is pacemaker dependent, but device function is normal and she has 5 years or so of battery left

## 2021-02-04 NOTE — Telephone Encounter (Signed)
Will route to MD/RN as Lorain Childes for patient- she was scheduled in March, appointment cancelled.

## 2021-02-05 NOTE — Telephone Encounter (Signed)
Successful telephone encounter to patients Daughter, Deniece Ree, inform that Dr. Sallyanne Kuster has agreed device remote monitoring can be discontinued secondary to hospice care. All future remote cancelled, patient marked inactive in paceart. Sending return kit to daughters address at [place] per daughter's request. Daughter appreciative of follow up.

## 2021-03-08 ENCOUNTER — Ambulatory Visit: Payer: Medicare Other | Admitting: Cardiovascular Disease

## 2021-04-03 DEATH — deceased
